# Patient Record
Sex: Male | Born: 1940 | Race: Black or African American | Hispanic: No | Smoking: Former smoker
Health system: Southern US, Community
[De-identification: ages and names within clinical notes are randomized; demographics above are authoritative.]

## PROBLEM LIST (undated history)

## (undated) DIAGNOSIS — N400 Enlarged prostate without lower urinary tract symptoms: Secondary | ICD-10-CM

## (undated) DIAGNOSIS — D509 Iron deficiency anemia, unspecified: Secondary | ICD-10-CM

## (undated) DIAGNOSIS — D126 Benign neoplasm of colon, unspecified: Secondary | ICD-10-CM

## (undated) DIAGNOSIS — H9190 Unspecified hearing loss, unspecified ear: Secondary | ICD-10-CM

## (undated) DIAGNOSIS — I499 Cardiac arrhythmia, unspecified: Secondary | ICD-10-CM

## (undated) DIAGNOSIS — E7800 Pure hypercholesterolemia, unspecified: Secondary | ICD-10-CM

## (undated) DIAGNOSIS — L989 Disorder of the skin and subcutaneous tissue, unspecified: Secondary | ICD-10-CM

## (undated) DIAGNOSIS — N189 Chronic kidney disease, unspecified: Secondary | ICD-10-CM

## (undated) DIAGNOSIS — H409 Unspecified glaucoma: Secondary | ICD-10-CM

## (undated) DIAGNOSIS — C642 Malignant neoplasm of left kidney, except renal pelvis: Secondary | ICD-10-CM

## (undated) DIAGNOSIS — I2510 Atherosclerotic heart disease of native coronary artery without angina pectoris: Secondary | ICD-10-CM

## (undated) DIAGNOSIS — I4892 Unspecified atrial flutter: Secondary | ICD-10-CM

## (undated) DIAGNOSIS — H9193 Unspecified hearing loss, bilateral: Secondary | ICD-10-CM

## (undated) DIAGNOSIS — C260 Malignant neoplasm of intestinal tract, part unspecified: Secondary | ICD-10-CM

## (undated) DIAGNOSIS — H269 Unspecified cataract: Secondary | ICD-10-CM

## (undated) DIAGNOSIS — I10 Essential (primary) hypertension: Secondary | ICD-10-CM

## (undated) DIAGNOSIS — I3139 Other pericardial effusion (noninflammatory): Secondary | ICD-10-CM

## (undated) DIAGNOSIS — IMO0001 Reserved for inherently not codable concepts without codable children: Secondary | ICD-10-CM

## (undated) DIAGNOSIS — M1990 Unspecified osteoarthritis, unspecified site: Secondary | ICD-10-CM

## (undated) DIAGNOSIS — K219 Gastro-esophageal reflux disease without esophagitis: Secondary | ICD-10-CM

## (undated) DIAGNOSIS — C49A4 Gastrointestinal stromal tumor of large intestine: Secondary | ICD-10-CM

## (undated) DIAGNOSIS — D638 Anemia in other chronic diseases classified elsewhere: Secondary | ICD-10-CM

## (undated) HISTORY — DX: Chronic kidney disease, unspecified: N18.9

## (undated) HISTORY — DX: Unspecified hearing loss, unspecified ear: H91.90

## (undated) HISTORY — DX: Benign neoplasm of colon, unspecified: D12.6

## (undated) HISTORY — DX: Gastrointestinal stromal tumor of large intestine: C49.A4

## (undated) HISTORY — DX: Unspecified atrial flutter: I48.92

## (undated) HISTORY — PX: ORIF ANKLE DISLOCATION: SUR918

## (undated) HISTORY — DX: Anemia in other chronic diseases classified elsewhere: D63.8

## (undated) HISTORY — DX: Malignant neoplasm of intestinal tract, part unspecified: C26.0

## (undated) HISTORY — DX: Reserved for inherently not codable concepts without codable children: IMO0001

## (undated) HISTORY — DX: Iron deficiency anemia, unspecified: D50.9

## (undated) HISTORY — PX: COLONOSCOPY: SHX174

## (undated) HISTORY — DX: Disorder of the skin and subcutaneous tissue, unspecified: L98.9

## (undated) HISTORY — PX: OTHER SURGICAL HISTORY: SHX169

## (undated) HISTORY — DX: Unspecified hearing loss, bilateral: H91.93

## (undated) HISTORY — DX: Unspecified cataract: H26.9

## (undated) HISTORY — PX: UPPER GASTROINTESTINAL ENDOSCOPY: SHX188

## (undated) HISTORY — PX: EYE SURGERY: SHX253

---

## 2010-12-03 ENCOUNTER — Ambulatory Visit (INDEPENDENT_AMBULATORY_CARE_PROVIDER_SITE_OTHER): Payer: Medicare Other | Admitting: Urology

## 2010-12-03 DIAGNOSIS — N529 Male erectile dysfunction, unspecified: Secondary | ICD-10-CM

## 2010-12-03 DIAGNOSIS — N400 Enlarged prostate without lower urinary tract symptoms: Secondary | ICD-10-CM

## 2010-12-03 DIAGNOSIS — R972 Elevated prostate specific antigen [PSA]: Secondary | ICD-10-CM

## 2011-03-24 ENCOUNTER — Ambulatory Visit: Admit: 2011-03-24 | Payer: Self-pay | Admitting: Ophthalmology

## 2011-03-24 SURGERY — SLT LASER APPLICATION
Anesthesia: LOCAL | Laterality: Right

## 2012-01-20 ENCOUNTER — Ambulatory Visit (INDEPENDENT_AMBULATORY_CARE_PROVIDER_SITE_OTHER): Payer: Medicare Other | Admitting: Urology

## 2012-01-20 DIAGNOSIS — N529 Male erectile dysfunction, unspecified: Secondary | ICD-10-CM

## 2012-01-20 DIAGNOSIS — R972 Elevated prostate specific antigen [PSA]: Secondary | ICD-10-CM

## 2012-01-20 DIAGNOSIS — N400 Enlarged prostate without lower urinary tract symptoms: Secondary | ICD-10-CM

## 2012-12-23 ENCOUNTER — Encounter: Payer: Self-pay | Admitting: Orthopedic Surgery

## 2013-01-12 ENCOUNTER — Ambulatory Visit (INDEPENDENT_AMBULATORY_CARE_PROVIDER_SITE_OTHER): Payer: Medicare Other | Admitting: Orthopedic Surgery

## 2013-01-12 ENCOUNTER — Other Ambulatory Visit: Payer: Self-pay | Admitting: *Deleted

## 2013-01-12 VITALS — BP 129/74 | Ht 74.5 in | Wt 218.0 lb

## 2013-01-12 DIAGNOSIS — M23302 Other meniscus derangements, unspecified lateral meniscus, unspecified knee: Secondary | ICD-10-CM | POA: Insufficient documentation

## 2013-01-12 DIAGNOSIS — M1711 Unilateral primary osteoarthritis, right knee: Secondary | ICD-10-CM | POA: Insufficient documentation

## 2013-01-12 DIAGNOSIS — IMO0002 Reserved for concepts with insufficient information to code with codable children: Secondary | ICD-10-CM

## 2013-01-12 DIAGNOSIS — M1710 Unilateral primary osteoarthritis, unspecified knee: Secondary | ICD-10-CM

## 2013-01-12 DIAGNOSIS — M23300 Other meniscus derangements, unspecified lateral meniscus, right knee: Secondary | ICD-10-CM

## 2013-01-12 HISTORY — DX: Other meniscus derangements, unspecified lateral meniscus, unspecified knee: M23.302

## 2013-01-12 HISTORY — DX: Unilateral primary osteoarthritis, right knee: M17.11

## 2013-01-12 NOTE — Progress Notes (Signed)
Patient ID: Mike Roach, male   DOB: 14-Jul-1940, 72 y.o.   MRN: [id number]  Chief Complaint  Patient presents with  . Knee Pain    second opinion right knee pain and swelling    HISTORY: This is a 72 year old male comes to [place] for second opinion after evaluation at [facility] for his right knee. He complains of mild to moderate pain and primarily swelling which is intermittent worse with activity and relieved with rest with intermittent aspirations. He's also been on meloxicam and had multiple aspirations and injections of his knee. He had x-ray and MRI which show moderate arthritis and torn lateral meniscus with bone edema lateral compartment and lateral compartment gonarthrosis. 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  He became unhappy at the other practice when Dr. switched and it was recommended that he have knee replacement.   BP 129/74  Ht 6' 2.5" (1.892 m)  Wt 218 lb (98.884 kg)  BMI 27.62 kg/m2 General appearance is normal, the patient is alert and oriented x3 with normal mood and affect. His ambulation pattern is slightly antalgic.  The left knee appears to be normal with no swelling normal range of motion intact ligaments normal strength and muscle tone normal skin normal pulse no lymphadenopathy normal sensation no pathologic reflexes. Overall balance is normal.  Right knee shows flexion up to 125 there appears to be full extension of the knee the knee is stable he has lateral joint line tenderness positive McMurray's intact skin pulses intact no lymphadenopathy normal sensation no pathologic reflexes  Upper extremities are normal  X-rays show moderate arthritis of the knee  MRI shows lateral compartment gonarthrosis, lateral meniscal tear. Lateral tibial and femoral bone stress reaction  Encounter Diagnoses  Name Primary?  . Lateral meniscus derangement, right Yes  . Osteoarthritis of right knee     We discussed his options which do include knee  arthroscopy because pain level is not to the degree that I would think knee replacement is necessary and he still very active he has no functional limitations. He is concerned about the frequent swelling.  We discussed that even after arthroscopy he may still need knee replacement but that removing his lateral meniscus and debriding the knee would probably be the better option at this stage of his disease process  He is comfortable with this. He asked me to do the surgery and I'm happy to do that.  Surgery will be done on November 21.  Surgical arthroscopy right knee partial lateral meniscectomy

## 2013-01-12 NOTE — Patient Instructions (Signed)
Surgery [facility] [address]  Arthroscopic Procedure, Knee An arthroscopic procedure can find what is wrong with your knee. PROCEDURE Arthroscopy is a surgical technique that allows your orthopedic surgeon to diagnose and treat your knee injury with accuracy. They will look into your knee through a small instrument. This is almost like a small (pencil sized) telescope. Because arthroscopy affects your knee less than open knee surgery, you can anticipate a more rapid recovery. Taking an active role by following your caregiver's instructions will help with rapid and complete recovery. Use crutches, rest, elevation, ice, and knee exercises as instructed. The length of recovery depends on various factors including type of injury, age, physical condition, medical conditions, and your rehabilitation. Your knee is the joint between the large bones (femur and tibia) in your leg. Cartilage covers these bone ends which are smooth and slippery and allow your knee to bend and move smoothly. Two menisci, thick, semi-lunar shaped pads of cartilage which form a rim inside the joint, help absorb shock and stabilize your knee. Ligaments bind the bones together and support your knee joint. Muscles move the joint, help support your knee, and take stress off the joint itself. Because of this all programs and physical therapy to rehabilitate an injured or repaired knee require rebuilding and strengthening your muscles. AFTER THE PROCEDURE  After the procedure, you will be moved to a recovery area until most of the effects of the medication have worn off. Your caregiver will discuss the test results with you.   Only take over-the-counter or prescription medicines for pain, discomfort, or fever as directed by your caregiver.    You have been scheduled for arthroscocpic knee surgery.  All surgeries carry some risk.  Remember you always have the option of continued nonsurgical treatment. However in this situation the risks vs. the  benefits favor surgery as the best treatment option. The risks of the surgery includes the following but is not limited to bleeding, infection, pulmonary embolus, death from anesthesia, nerve injury vascular injury or need for further surgery, continued pain.  Specific to this procedure the following risks and complications are rare but possible Stiffness, pain, weakness, giving out  I expect  recovery will be in 3-4 weeks some patients take 6 weeks.  You  will need physical therapy after the procedure  Stop any blood thinning medication: such as warfarin, coumadin, naprosyn, ibuprofen, advil, diclofenac, aspirin

## 2013-01-13 ENCOUNTER — Ambulatory Visit: Payer: Medicare Other | Admitting: Orthopedic Surgery

## 2013-01-17 ENCOUNTER — Telehealth: Payer: Self-pay | Admitting: Orthopedic Surgery

## 2013-01-17 ENCOUNTER — Other Ambulatory Visit (HOSPITAL_COMMUNITY): Payer: Medicare Other

## 2013-01-17 NOTE — Telephone Encounter (Signed)
Contacted insurer, pre-authorization information for out-patient surgery scheduled 01/21/13, at [facility], CPT codes [address], [phone]; per Otho Najjar, no pre-authorization required for in-network providers; her name, today's date 01/17/13, 4:13p.m.  *Noted: patient's date of birth on file, provided by patient, does not match date of birth in their system; insurer is unable to provide any further information. Left message for patient to return call, in event he is not aware.

## 2013-01-18 ENCOUNTER — Ambulatory Visit (INDEPENDENT_AMBULATORY_CARE_PROVIDER_SITE_OTHER): Payer: Medicare Other | Admitting: Urology

## 2013-01-18 ENCOUNTER — Encounter (HOSPITAL_COMMUNITY): Payer: Self-pay

## 2013-01-18 DIAGNOSIS — N400 Enlarged prostate without lower urinary tract symptoms: Secondary | ICD-10-CM

## 2013-01-18 DIAGNOSIS — R972 Elevated prostate specific antigen [PSA]: Secondary | ICD-10-CM

## 2013-01-19 ENCOUNTER — Encounter (HOSPITAL_COMMUNITY): Payer: Self-pay

## 2013-01-19 ENCOUNTER — Encounter (HOSPITAL_COMMUNITY)
Admission: RE | Admit: 2013-01-19 | Discharge: 2013-01-19 | Disposition: A | Discharge status: Home / Self Care | Payer: Medicare Other | Source: Ambulatory Visit | Attending: Orthopedic Surgery | Admitting: Orthopedic Surgery

## 2013-01-19 HISTORY — DX: Unspecified glaucoma: H40.9

## 2013-01-19 HISTORY — DX: Pure hypercholesterolemia, unspecified: E78.00

## 2013-01-19 HISTORY — DX: Essential (primary) hypertension: I10

## 2013-01-19 HISTORY — DX: Gastro-esophageal reflux disease without esophagitis: K21.9

## 2013-01-19 HISTORY — DX: Unspecified osteoarthritis, unspecified site: M19.90

## 2013-01-19 LAB — BASIC METABOLIC PANEL
BUN: 30 mg/dL — ABNORMAL HIGH (ref 6–23)
CO2: 25 mEq/L (ref 19–32)
Chloride: 106 mEq/L (ref 96–112)
Creatinine, Ser: 1.48 mg/dL — ABNORMAL HIGH (ref 0.50–1.35)
GFR calc Af Amer: 53 mL/min — ABNORMAL LOW (ref >90)
Sodium: 139 mEq/L (ref 135–145)

## 2013-01-19 NOTE — Patient Instructions (Signed)
Gershom Brobeck  01/19/2013   Your procedure is scheduled on:   01/21/2013  Report to [facility] at  825  AM.  Call this number if you have problems the morning of surgery: [phone]   Remember:   Do not eat food or drink liquids after midnight.   Take these medicines the morning of surgery with A SIP OF WATER: lisinopril, mobic, prilosec   Do not wear jewelry, make-up or nail polish.  Do not wear lotions, powders, or perfumes.   Do not shave 48 hours prior to surgery. Men may shave face and neck.  Do not bring valuables to the hospital.  [facility] is not responsible for any belongings or valuables.               Contacts, dentures or bridgework may not be worn into surgery.  Leave suitcase in the car. After surgery it may be brought to your room.  For patients admitted to the hospital, discharge time is determined by your treatment team.               Patients discharged the day of surgery will not be allowed to drive home.  Name and phone number of your driver: family  Special Instructions: Shower using CHG 2 nights before surgery and the night before surgery.  If you shower the day of surgery use CHG.  Use special wash - you have one bottle of CHG for all showers.  You should use approximately 1/3 of the bottle for each shower.   Please read over the following fact sheets that you were given: Pain Booklet, Coughing and Deep Breathing, Surgical Site Infection Prevention, Anesthesia Post-op Instructions and Care and Recovery After Surgery Arthroscopic Procedure, Knee An arthroscopic procedure can find what is wrong with your knee. PROCEDURE Arthroscopy is a surgical technique that allows your orthopedic surgeon to diagnose and treat your knee injury with accuracy. They will look into your knee through a small instrument. This is almost like a small (pencil sized) telescope. Because arthroscopy affects your knee less than open knee surgery, you can anticipate a more rapid  recovery. Taking an active role by following your caregiver's instructions will help with rapid and complete recovery. Use crutches, rest, elevation, ice, and knee exercises as instructed. The length of recovery depends on various factors including type of injury, age, physical condition, medical conditions, and your rehabilitation. Your knee is the joint between the large bones (femur and tibia) in your leg. Cartilage covers these bone ends which are smooth and slippery and allow your knee to bend and move smoothly. Two menisci, thick, semi-lunar shaped pads of cartilage which form a rim inside the joint, help absorb shock and stabilize your knee. Ligaments bind the bones together and support your knee joint. Muscles move the joint, help support your knee, and take stress off the joint itself. Because of this all programs and physical therapy to rehabilitate an injured or repaired knee require rebuilding and strengthening your muscles. AFTER THE PROCEDURE  After the procedure, you will be moved to a recovery area until most of the effects of the medication have worn off. Your caregiver will discuss the test results with you.  Only take over-the-counter or prescription medicines for pain, discomfort, or fever as directed by your caregiver. SEEK MEDICAL CARE IF:   You have increased bleeding from your wounds.  You see redness, swelling, or have increasing pain in your wounds.  You have pus coming from  your wound.  You have an oral temperature above 102 F (38.9 C).  You notice a bad smell coming from the wound or dressing.  You have severe pain with any motion of your knee. SEEK IMMEDIATE MEDICAL CARE IF:   You develop a rash.  You have difficulty breathing.  You have any allergic problems. Document Released: 02/15/2000 Document Revised: 05/12/2011 Document Reviewed: 09/08/2007 [facility] Patient Information 2014 [place]. PATIENT INSTRUCTIONS POST-ANESTHESIA  IMMEDIATELY  FOLLOWING SURGERY:  Do not drive or operate machinery for the first twenty four hours after surgery.  Do not make any important decisions for twenty four hours after surgery or while taking narcotic pain medications or sedatives.  If you develop intractable nausea and vomiting or a severe headache please notify your doctor immediately.  FOLLOW-UP:  Please make an appointment with your surgeon as instructed. You do not need to follow up with anesthesia unless specifically instructed to do so.  WOUND CARE INSTRUCTIONS (if applicable):  Keep a dry clean dressing on the anesthesia/puncture wound site if there is drainage.  Once the wound has quit draining you may leave it open to air.  Generally you should leave the bandage intact for twenty four hours unless there is drainage.  If the epidural site drains for more than 36-48 hours please call the anesthesia department.  QUESTIONS?:  Please feel free to call your physician or the hospital operator if you have any questions, and they will be happy to assist you.

## 2013-01-20 ENCOUNTER — Encounter: Payer: Self-pay | Admitting: Orthopedic Surgery

## 2013-01-20 NOTE — H&P (Signed)
  Patient ID: Mike Roach, male   DOB: 1940-03-17, 72 y.o.   MRN: [id number]    Chief Complaint   Patient presents with   .  Knee Pain       second opinion right knee pain and swelling     HISTORY: This is a 72 year old male comes to [place] for second opinion after evaluation at [facility] for his right knee. He complains of mild to moderate pain and primarily swelling which is intermittent worse with activity and relieved with rest with intermittent aspirations. He's also been on meloxicam and had multiple aspirations and injections of his knee. He had x-ray and MRI which show moderate arthritis and torn lateral meniscus with bone edema lateral compartment and lateral compartment gonarthrosis. 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  He became unhappy at the other practice when Dr. switched and it was recommended that he have knee replacement.   BP 129/74  Ht 6' 2.5" (1.892 m)  Wt 218 lb (98.884 kg)  BMI 27.62 kg/m2 General appearance is normal, the patient is alert and oriented x3 with normal mood and affect. His ambulation pattern is slightly antalgic.  The left knee appears to be normal with no swelling normal range of motion intact ligaments normal strength and muscle tone normal skin normal pulse no lymphadenopathy normal sensation no pathologic reflexes. Overall balance is normal.  Right knee shows flexion up to 125 there appears to be full extension of the knee the knee is stable he has lateral joint line tenderness positive McMurray's intact skin pulses intact no lymphadenopathy normal sensation no pathologic reflexes  Upper extremities are normal  X-rays show moderate arthritis of the knee  MRI shows lateral compartment gonarthrosis, lateral meniscal tear. Lateral tibial and femoral bone stress reaction    Encounter Diagnoses   Name  Primary?   .  Lateral meniscus derangement, right  Yes   .  Osteoarthritis of right knee       We discussed his options  which do include knee arthroscopy because pain level is not to the degree that I would think knee replacement is necessary and he still very active he has no functional limitations. He is concerned about the frequent swelling.  We discussed that even after arthroscopy he may still need knee replacement but that removing his lateral meniscus and debriding the knee would probably be the better option at this stage of his disease process  He is comfortable with this. He asked me to do the surgery and I'm happy to do that.  Surgery will be done on November 21.  Surgical arthroscopy right knee partial lateral meniscectomy

## 2013-01-21 ENCOUNTER — Encounter (HOSPITAL_COMMUNITY): Payer: Medicare Other | Admitting: Anesthesiology

## 2013-01-21 ENCOUNTER — Encounter (HOSPITAL_COMMUNITY): Payer: Self-pay | Admitting: *Deleted

## 2013-01-21 ENCOUNTER — Encounter (HOSPITAL_COMMUNITY)
Admission: RE | Disposition: A | Discharge status: Home / Self Care | Payer: Self-pay | Source: Ambulatory Visit | Attending: Orthopedic Surgery

## 2013-01-21 ENCOUNTER — Ambulatory Visit (HOSPITAL_COMMUNITY)
Admission: RE | Admit: 2013-01-21 | Discharge: 2013-01-21 | Disposition: A | Discharge status: Home / Self Care | Payer: Medicare Other | Source: Ambulatory Visit | Attending: Orthopedic Surgery | Admitting: Orthopedic Surgery

## 2013-01-21 ENCOUNTER — Ambulatory Visit (HOSPITAL_COMMUNITY): Payer: Medicare Other | Admitting: Anesthesiology

## 2013-01-21 DIAGNOSIS — IMO0002 Reserved for concepts with insufficient information to code with codable children: Secondary | ICD-10-CM | POA: Insufficient documentation

## 2013-01-21 DIAGNOSIS — M1711 Unilateral primary osteoarthritis, right knee: Secondary | ICD-10-CM

## 2013-01-21 DIAGNOSIS — M23302 Other meniscus derangements, unspecified lateral meniscus, unspecified knee: Secondary | ICD-10-CM

## 2013-01-21 DIAGNOSIS — Z01810 Encounter for preprocedural cardiovascular examination: Secondary | ICD-10-CM | POA: Insufficient documentation

## 2013-01-21 DIAGNOSIS — Z01812 Encounter for preprocedural laboratory examination: Secondary | ICD-10-CM | POA: Insufficient documentation

## 2013-01-21 DIAGNOSIS — Z79899 Other long term (current) drug therapy: Secondary | ICD-10-CM | POA: Insufficient documentation

## 2013-01-21 DIAGNOSIS — M23349 Other meniscus derangements, anterior horn of lateral meniscus, unspecified knee: Secondary | ICD-10-CM | POA: Insufficient documentation

## 2013-01-21 DIAGNOSIS — I10 Essential (primary) hypertension: Secondary | ICD-10-CM | POA: Insufficient documentation

## 2013-01-21 DIAGNOSIS — M1710 Unilateral primary osteoarthritis, unspecified knee: Secondary | ICD-10-CM | POA: Insufficient documentation

## 2013-01-21 DIAGNOSIS — M23300 Other meniscus derangements, unspecified lateral meniscus, right knee: Secondary | ICD-10-CM

## 2013-01-21 HISTORY — PX: KNEE ARTHROSCOPY WITH LATERAL MENISECTOMY: SHX6193

## 2013-01-21 SURGERY — ARTHROSCOPY, KNEE, WITH LATERAL MENISCECTOMY
Anesthesia: General | Site: Knee | Laterality: Right | Wound class: Clean

## 2013-01-21 MED ORDER — BUPIVACAINE-EPINEPHRINE PF 0.5-1:200000 % IJ SOLN
INTRAMUSCULAR | Status: AC
  Filled 2013-01-21: qty 10

## 2013-01-21 MED ORDER — CEFAZOLIN SODIUM-DEXTROSE 2-3 GM-% IV SOLR
INTRAVENOUS | Status: AC
  Filled 2013-01-21: qty 50

## 2013-01-21 MED ORDER — LIDOCAINE HCL 1 % IJ SOLN
INTRAMUSCULAR | Status: DC | PRN
  Administered 2013-01-21: 30 mg via INTRADERMAL

## 2013-01-21 MED ORDER — CHLORHEXIDINE GLUCONATE 4 % EX LIQD: 60.0000 mL | Freq: Once | CUTANEOUS | Status: DC

## 2013-01-21 MED ORDER — FENTANYL CITRATE 0.05 MG/ML IJ SOLN
INTRAMUSCULAR | Status: DC | PRN
  Administered 2013-01-21 (×2): 50 ug via INTRAVENOUS

## 2013-01-21 MED ORDER — ONDANSETRON HCL 4 MG/2ML IJ SOLN: 4.0000 mg | Freq: Once | INTRAMUSCULAR | Status: DC | PRN

## 2013-01-21 MED ORDER — HYDROCODONE-ACETAMINOPHEN 5-325 MG PO TABS
1.0000 | ORAL_TABLET | Freq: Once | ORAL | Status: AC
  Administered 2013-01-21: 1 via ORAL
  Filled 2013-01-21: qty 1

## 2013-01-21 MED ORDER — PROPOFOL 10 MG/ML IV BOLUS
INTRAVENOUS | Status: DC | PRN
  Administered 2013-01-21: 50 mg via INTRAVENOUS
  Administered 2013-01-21: 150 mg via INTRAVENOUS
  Administered 2013-01-21: 75 mg via INTRAVENOUS

## 2013-01-21 MED ORDER — ONDANSETRON HCL 4 MG/2ML IJ SOLN
INTRAMUSCULAR | Status: AC
  Filled 2013-01-21: qty 2

## 2013-01-21 MED ORDER — HYDROCODONE-ACETAMINOPHEN 7.5-325 MG PO TABS: 1.0000 | ORAL_TABLET | ORAL | Status: DC | PRN

## 2013-01-21 MED ORDER — LACTATED RINGERS IV SOLN
INTRAVENOUS | Status: DC
  Administered 2013-01-21 (×2): via INTRAVENOUS

## 2013-01-21 MED ORDER — KETOROLAC TROMETHAMINE 30 MG/ML IJ SOLN
30.0000 mg | Freq: Once | INTRAMUSCULAR | Status: AC
  Administered 2013-01-21: 30 mg via INTRAVENOUS
  Filled 2013-01-21: qty 1

## 2013-01-21 MED ORDER — PROMETHAZINE HCL 12.5 MG PO TABS: 12.5000 mg | ORAL_TABLET | Freq: Four times a day (QID) | ORAL | Status: DC | PRN

## 2013-01-21 MED ORDER — SODIUM CHLORIDE 0.9 % IR SOLN
Status: DC | PRN
  Administered 2013-01-21: 1000 mL

## 2013-01-21 MED ORDER — ONDANSETRON HCL 4 MG/2ML IJ SOLN
4.0000 mg | Freq: Once | INTRAMUSCULAR | Status: AC
  Administered 2013-01-21: 4 mg via INTRAVENOUS

## 2013-01-21 MED ORDER — FENTANYL CITRATE 0.05 MG/ML IJ SOLN: 25.0000 ug | INTRAMUSCULAR | Status: DC | PRN

## 2013-01-21 MED ORDER — BUPIVACAINE-EPINEPHRINE PF 0.5-1:200000 % IJ SOLN
INTRAMUSCULAR | Status: AC
  Filled 2013-01-21: qty 20

## 2013-01-21 MED ORDER — DEXAMETHASONE SODIUM PHOSPHATE 4 MG/ML IJ SOLN
INTRAMUSCULAR | Status: AC
  Filled 2013-01-21: qty 1

## 2013-01-21 MED ORDER — SUCCINYLCHOLINE CHLORIDE 20 MG/ML IJ SOLN
INTRAMUSCULAR | Status: AC
  Filled 2013-01-21: qty 1

## 2013-01-21 MED ORDER — ONDANSETRON HCL 4 MG/2ML IJ SOLN
4.0000 mg | Freq: Once | INTRAMUSCULAR | Status: AC
  Administered 2013-01-21: 4 mg via INTRAVENOUS
  Filled 2013-01-21: qty 2

## 2013-01-21 MED ORDER — EPHEDRINE SULFATE 50 MG/ML IJ SOLN
INTRAMUSCULAR | Status: DC | PRN
  Administered 2013-01-21 (×3): 10 mg via INTRAVENOUS

## 2013-01-21 MED ORDER — DEXAMETHASONE SODIUM PHOSPHATE 4 MG/ML IJ SOLN
4.0000 mg | Freq: Once | INTRAMUSCULAR | Status: AC
Start: 2013-01-21 — End: 2013-01-21
  Administered 2013-01-21: 4 mg via INTRAVENOUS

## 2013-01-21 MED ORDER — EPINEPHRINE HCL 1 MG/ML IJ SOLN
INTRAMUSCULAR | Status: AC
  Filled 2013-01-21: qty 5

## 2013-01-21 MED ORDER — MIDAZOLAM HCL 2 MG/2ML IJ SOLN
1.0000 mg | INTRAMUSCULAR | Status: DC | PRN
  Administered 2013-01-21: 2 mg via INTRAVENOUS

## 2013-01-21 MED ORDER — FENTANYL CITRATE 0.05 MG/ML IJ SOLN
INTRAMUSCULAR | Status: AC
  Filled 2013-01-21: qty 5

## 2013-01-21 MED ORDER — CEFAZOLIN SODIUM-DEXTROSE 2-3 GM-% IV SOLR
2.0000 g | INTRAVENOUS | Status: AC
  Administered 2013-01-21: 2 g via INTRAVENOUS

## 2013-01-21 MED ORDER — FENTANYL CITRATE 0.05 MG/ML IJ SOLN
25.0000 ug | INTRAMUSCULAR | Status: AC
  Administered 2013-01-21 (×2): 25 ug via INTRAVENOUS

## 2013-01-21 MED ORDER — FENTANYL CITRATE 0.05 MG/ML IJ SOLN
INTRAMUSCULAR | Status: AC
  Filled 2013-01-21: qty 2

## 2013-01-21 MED ORDER — SODIUM CHLORIDE 0.9 % IR SOLN
Status: DC | PRN
  Administered 2013-01-21: 11:00:00

## 2013-01-21 MED ORDER — MIDAZOLAM HCL 2 MG/2ML IJ SOLN
INTRAMUSCULAR | Status: AC
  Filled 2013-01-21: qty 2

## 2013-01-21 MED ORDER — PROPOFOL 10 MG/ML IV BOLUS
INTRAVENOUS | Status: AC
  Filled 2013-01-21: qty 20

## 2013-01-21 MED ORDER — SUCCINYLCHOLINE CHLORIDE 20 MG/ML IJ SOLN
INTRAMUSCULAR | Status: DC | PRN
  Administered 2013-01-21: 100 mg via INTRAVENOUS

## 2013-01-21 SURGICAL SUPPLY — 49 items
ARTHROWAND PARAGON T2 (SURGICAL WAND) ×2
BAG HAMPER (MISCELLANEOUS) ×2 IMPLANT
BANDAGE ELASTIC 6 VELCRO NS (GAUZE/BANDAGES/DRESSINGS) ×2 IMPLANT
BLADE AGGRESSIVE PLUS 4.0 (BLADE) ×2 IMPLANT
BLADE SURG SZ11 CARB STEEL (BLADE) ×2 IMPLANT
CHLORAPREP W/TINT 26ML (MISCELLANEOUS) ×2 IMPLANT
CLOTH BEACON ORANGE TIMEOUT ST (SAFETY) ×2 IMPLANT
COOLER CRYO IC GRAV AND TUBE (ORTHOPEDIC SUPPLIES) ×2 IMPLANT
COVER PROBE W GEL 5X96 (DRAPES) ×2 IMPLANT
CUFF CRYO KNEE18X23 MED (MISCELLANEOUS) ×2 IMPLANT
CUFF TOURNIQUET SINGLE 34IN LL (TOURNIQUET CUFF) ×2 IMPLANT
CUTTER ANGLED DBL BITE 4.5 (BURR) ×2 IMPLANT
GAUZE SPONGE 4X4 16PLY XRAY LF (GAUZE/BANDAGES/DRESSINGS) ×2 IMPLANT
GAUZE XEROFORM 5X9 LF (GAUZE/BANDAGES/DRESSINGS) ×2 IMPLANT
GLOVE BIOGEL PI IND STRL 7.0 (GLOVE) ×2 IMPLANT
GLOVE BIOGEL PI INDICATOR 7.0 (GLOVE) ×2
GLOVE SKINSENSE NS SZ8.0 LF (GLOVE) ×1
GLOVE SKINSENSE STRL SZ8.0 LF (GLOVE) ×1 IMPLANT
GLOVE SS BIOGEL STRL SZ 6.5 (GLOVE) ×1 IMPLANT
GLOVE SS N UNI LF 8.5 STRL (GLOVE) ×2 IMPLANT
GLOVE SUPERSENSE BIOGEL SZ 6.5 (GLOVE) ×1
GOWN PREVENTION PLUS XLARGE (GOWN DISPOSABLE) ×2 IMPLANT
GOWN STRL REIN XL XLG (GOWN DISPOSABLE) ×2 IMPLANT
HLDR LEG FOAM (MISCELLANEOUS) ×1 IMPLANT
IV NS IRRIG 3000ML ARTHROMATIC (IV SOLUTION) ×4 IMPLANT
KIT BLADEGUARD II DBL (SET/KITS/TRAYS/PACK) ×2 IMPLANT
KIT ROOM TURNOVER AP CYSTO (KITS) ×2 IMPLANT
LEG HOLDER FOAM (MISCELLANEOUS) ×1
MANIFOLD NEPTUNE II (INSTRUMENTS) ×2 IMPLANT
MARKER SKIN DUAL TIP RULER LAB (MISCELLANEOUS) ×2 IMPLANT
NEEDLE HYPO 18GX1.5 BLUNT FILL (NEEDLE) ×2 IMPLANT
NEEDLE HYPO 21X1.5 SAFETY (NEEDLE) ×2 IMPLANT
NEEDLE SPNL 18GX3.5 QUINCKE PK (NEEDLE) ×2 IMPLANT
NS IRRIG 1000ML POUR BTL (IV SOLUTION) ×2 IMPLANT
PACK ARTHRO LIMB DRAPE STRL (MISCELLANEOUS) ×2 IMPLANT
PAD ABD 5X9 TENDERSORB (GAUZE/BANDAGES/DRESSINGS) ×2 IMPLANT
PAD ARMBOARD 7.5X6 YLW CONV (MISCELLANEOUS) ×2 IMPLANT
PADDING CAST COTTON 6X4 STRL (CAST SUPPLIES) ×2 IMPLANT
SET ARTHROSCOPY INST (INSTRUMENTS) ×2 IMPLANT
SET ARTHROSCOPY PUMP TUBE (IRRIGATION / IRRIGATOR) ×2 IMPLANT
SET BASIN LINEN APH (SET/KITS/TRAYS/PACK) ×2 IMPLANT
SPONGE GAUZE 4X4 12PLY (GAUZE/BANDAGES/DRESSINGS) ×2 IMPLANT
SUT ETHILON 3 0 FSL (SUTURE) ×2 IMPLANT
SYR 30ML LL (SYRINGE) ×2 IMPLANT
SYRINGE 10CC LL (SYRINGE) ×2 IMPLANT
WAND 50 DEG COVAC W/CORD (SURGICAL WAND) ×2 IMPLANT
WAND ARTHRO PARAGON T2 (SURGICAL WAND) ×1 IMPLANT
WATER STERILE IRR 1000ML POUR (IV SOLUTION) ×2 IMPLANT
YANKAUER SUCT BULB TIP 10FT TU (MISCELLANEOUS) ×6 IMPLANT

## 2013-01-21 NOTE — Op Note (Signed)
01/21/2013  11:27 AM  PATIENT:  Mike Roach  72 y.o. male  PRE-OPERATIVE DIAGNOSIS:  right lateral meniscal tear  POST-OPERATIVE DIAGNOSIS:  right lateral meniscal tear, degenerative arthritis  PROCEDURE:  Procedure(s): KNEE ARTHROSCOPY WITH PARTIAL LATERAL MENISECTOMY (Right)  Operative findings severe arthritis of lateral compartment grade 4 chondral lesion of the tibial plateau grade 3 chondral lesion of the trochlea and medial compartment was normal the anterior cruciate ligament and PCL were intact there was a tear the lateral meniscus anterior and body.  Surgical details  Operative findings : MEDIAL normal  LATERAL grade 4 tibial chondral lesion, lateral meniscal tear midbody and anterior horn  PATELLA grade 1 chondromalacia median ridge  TROCHLEA grade 3 chondral lesion   Indications for procedure pain mechanical symptoms unresponsive to nonoperative treatment  The patient was identified in the preop holding area as [name] the right knee was confirmed as a surgical site and marked. The chart was reviewed  The patient was taken to the operating room and  was given appropriate preoperative antibiotic  and general anesthesia was administered. The operative leg (right ) was placed in the arthroscopic leg holder, the well leg was placed in a well leg holder  The right  leg was then prepped and draped sterile The surgical site was confirmed and the timeout procedure was completed  The lateral portal was injected with Marcaine with epinephrine solution and a stab wound was made. The scope was placed in the lateral portal into the medial compartment. The  Diagnostic portion of the  arthroscopy was completed. A medial portal was established in the same fashion and a probe was placed into the joint. The diagnostic arthroscopy   was repeated using a probe to palpate intra-articular structures  A combination of upbiters was used to morselized the meniscal tear. The fragments were  then removed with a motorized shaver. The knee was then balanced with an arthroscopic wand and shaver.  A probe was placed on the meniscus to confirm a stable rim.  The knee was irrigated meniscal fragments remaining were removed. The portals were closed with 3-0 nylon suture. The knee joint was then injected with 45 cc of Marcaine with epinephrine. A sterile dressing was applied followed by an Ace bandage and a Cryo/Cuff.  The patient was extubated and taken to the recovery room in stable condition. SURGEON:  Surgeon(s) and Role:    * Micheil Klaus E Kahlil Cowans, MD - Primary  PHYSICIAN ASSISTANT:   ASSISTANTS: none   ANESTHESIA:   general  EBL:  Total I/O In: 1000 [I.V.:1000] Out: 0   BLOOD ADMINISTERED:none  DRAINS: none   LOCAL MEDICATIONS USED:  MARCAINE     SPECIMEN:  No Specimen  DISPOSITION OF SPECIMEN:  N/A  COUNTS:  YES  TOURNIQUET:    DICTATION: .dragon  DISCHARGE   PATIENT DISPOSITION:  PACU - hemodynamically stable.   Delay start of Pharmacological VTE agent (>24hrs) due to surgical blood loss or risk of bleeding: not applicable  

## 2013-01-21 NOTE — Anesthesia Postprocedure Evaluation (Signed)
  Anesthesia Post-op Note  Patient: Mike Roach  Procedure(s) Performed: Procedure(s): KNEE ARTHROSCOPY WITH PARTIAL LATERAL MENISECTOMY (Right)  Patient Location: PACU  Anesthesia Type:General  Level of Consciousness: awake, alert  and oriented  Airway and Oxygen Therapy: Patient Spontanous Breathing and Patient connected to face mask oxygen  Post-op Pain: none  Post-op Assessment: Post-op Vital signs reviewed, Patient's Cardiovascular Status Stable, Respiratory Function Stable, Patent Airway and No signs of Nausea or vomiting  Post-op Vital Signs: Reviewed and stable  Complications: No apparent anesthesia complications

## 2013-01-21 NOTE — Interval H&P Note (Signed)
History and Physical Interval Note:  01/21/2013 10:08 AM  Mike Roach  has presented today for surgery, with the diagnosis of right lateral meniscal tear  The various methods of treatment have been discussed with the patient and family. After consideration of risks, benefits and other options for treatment, the patient has consented to  Procedure(s): KNEE ARTHROSCOPY WITH LATERAL MENISECTOMY (Right) as a surgical intervention .  The patient's history has been reviewed, patient examined, no change in status, stable for surgery.  I have reviewed the patient's chart and labs.  Questions were answered to the patient's satisfaction.     Fuller Canada

## 2013-01-21 NOTE — Anesthesia Procedure Notes (Addendum)
Procedure Name: LMA Insertion Date/Time: 01/21/2013 10:35 AM Performed by: Glynn Octave E Pre-anesthesia Checklist: Patient identified, Patient being monitored, Emergency Drugs available, Timeout performed and Suction available Patient Re-evaluated:Patient Re-evaluated prior to inductionOxygen Delivery Method: Circle System Utilized Preoxygenation: Pre-oxygenation with 100% oxygen Intubation Type: IV induction Ventilation: Mask ventilation without difficulty LMA: LMA inserted LMA Size: 4.0 Number of attempts: 1 Placement Confirmation: positive ETCO2 and breath sounds checked- equal and bilateral Comments: LMA #5 and then #4 placed.  Unable to obtain a satisfactory seal and therefore opted to secure airway with a ETT   Procedure Name: Intubation Date/Time: 01/21/2013 10:42 AM Performed by: Glynn Octave E Pre-anesthesia Checklist: Patient identified, Patient being monitored, Timeout performed, Emergency Drugs available and Suction available Patient Re-evaluated:Patient Re-evaluated prior to inductionOxygen Delivery Method: Circle System Utilized Preoxygenation: Pre-oxygenation with 100% oxygen Intubation Type: IV induction Ventilation: Mask ventilation without difficulty Laryngoscope Size: Mac and 3 Grade View: Grade I Tube type: Oral Tube size: 7.0 mm Number of attempts: 1 Airway Equipment and Method: stylet Placement Confirmation: ETT inserted through vocal cords under direct vision,  positive ETCO2 and breath sounds checked- equal and bilateral Secured at: 21 cm Tube secured with: Tape Dental Injury: Teeth and Oropharynx as per pre-operative assessment

## 2013-01-21 NOTE — Brief Op Note (Signed)
01/21/2013  11:27 AM  PATIENT:  Mike Roach  72 y.o. male  PRE-OPERATIVE DIAGNOSIS:  right lateral meniscal tear  POST-OPERATIVE DIAGNOSIS:  right lateral meniscal tear, degenerative arthritis  PROCEDURE:  Procedure(s): KNEE ARTHROSCOPY WITH PARTIAL LATERAL MENISECTOMY (Right)  Operative findings severe arthritis of lateral compartment grade 4 chondral lesion of the tibial plateau grade 3 chondral lesion of the trochlea and medial compartment was normal the anterior cruciate ligament and PCL were intact there was a tear the lateral meniscus anterior and body.  Surgical details  Operative findings : MEDIAL normal  LATERAL grade 4 tibial chondral lesion, lateral meniscal tear midbody and anterior horn  PATELLA grade 1 chondromalacia median ridge  TROCHLEA grade 3 chondral lesion   Indications for procedure pain mechanical symptoms unresponsive to nonoperative treatment  The patient was identified in the preop holding area as Mike Roach the right knee was confirmed as a surgical site and marked. The chart was reviewed  The patient was taken to the operating room and  was given appropriate preoperative antibiotic  and general anesthesia was administered. The operative leg (right ) was placed in the arthroscopic leg holder, the well leg was placed in a well leg holder  The right  leg was then prepped and draped sterile The surgical site was confirmed and the timeout procedure was completed  The lateral portal was injected with Marcaine with epinephrine solution and a stab wound was made. The scope was placed in the lateral portal into the medial compartment. The  Diagnostic portion of the  arthroscopy was completed. A medial portal was established in the same fashion and a probe was placed into the joint. The diagnostic arthroscopy   was repeated using a probe to palpate intra-articular structures  A combination of upbiters was used to morselized the meniscal tear. The fragments were  then removed with a motorized shaver. The knee was then balanced with an arthroscopic wand and shaver.  A probe was placed on the meniscus to confirm a stable rim.  The knee was irrigated meniscal fragments remaining were removed. The portals were closed with 3-0 nylon suture. The knee joint was then injected with 45 cc of Marcaine with epinephrine. A sterile dressing was applied followed by an Ace bandage and a Cryo/Cuff.  The patient was extubated and taken to the recovery room in stable condition. SURGEON:  Surgeon(s) and Role:    * Vickki Hearing, MD - Primary  PHYSICIAN ASSISTANT:   ASSISTANTS: none   ANESTHESIA:   general  EBL:  Total I/O In: 1000 [I.V.:1000] Out: 0   BLOOD ADMINISTERED:none  DRAINS: none   LOCAL MEDICATIONS USED:  MARCAINE     SPECIMEN:  No Specimen  DISPOSITION OF SPECIMEN:  N/A  COUNTS:  YES  TOURNIQUET:    DICTATION: .dragon  DISCHARGE   PATIENT DISPOSITION:  PACU - hemodynamically stable.   Delay start of Pharmacological VTE agent (>24hrs) due to surgical blood loss or risk of bleeding: not applicable

## 2013-01-21 NOTE — Transfer of Care (Signed)
Immediate Anesthesia Transfer of Care Note  Patient: Mike Roach  Procedure(s) Performed: Procedure(s): KNEE ARTHROSCOPY WITH PARTIAL LATERAL MENISECTOMY (Right)  Patient Location: PACU  Anesthesia Type:General  Level of Consciousness: awake, alert  and oriented  Airway & Oxygen Therapy: Patient Spontanous Breathing and Patient connected to face mask oxygen  Post-op Assessment: Report given to PACU RN  Post vital signs: Reviewed and stable  Complications: No apparent anesthesia complications

## 2013-01-21 NOTE — Anesthesia Preprocedure Evaluation (Addendum)
Anesthesia Evaluation  Patient identified by MRN, date of birth, ID band Patient awake    Reviewed: Allergy & Precautions, H&P , NPO status , Patient's Chart, lab work & pertinent test results  History of Anesthesia Complications Negative for: history of anesthetic complications  Airway Mallampati: II TM Distance: >3 FB     Dental  (+) Teeth Intact   Pulmonary neg pulmonary ROS, former smoker,  breath sounds clear to auscultation        Cardiovascular hypertension, Rhythm:Regular Rate:Normal     Neuro/Psych    GI/Hepatic   Endo/Other    Renal/GU      Musculoskeletal   Abdominal   Peds  Hematology   Anesthesia Other Findings   Reproductive/Obstetrics                        Anesthesia Physical Anesthesia Plan  ASA: II  Anesthesia Plan: General   Post-op Pain Management:    Induction: Intravenous  Airway Management Planned: LMA  Additional Equipment:   Intra-op Plan:   Post-operative Plan: Extubation in OR  Informed Consent: I have reviewed the patients History and Physical, chart, labs and discussed the procedure including the risks, benefits and alternatives for the proposed anesthesia with the patient or authorized representative who has indicated his/her understanding and acceptance.     Plan Discussed with:   Anesthesia Plan Comments:         Anesthesia Quick Evaluation

## 2013-01-24 ENCOUNTER — Ambulatory Visit (INDEPENDENT_AMBULATORY_CARE_PROVIDER_SITE_OTHER): Payer: Self-pay | Admitting: Orthopedic Surgery

## 2013-01-24 ENCOUNTER — Encounter: Payer: Self-pay | Admitting: Orthopedic Surgery

## 2013-01-24 VITALS — BP 156/84 | Ht 74.5 in | Wt 218.0 lb

## 2013-01-24 DIAGNOSIS — M23302 Other meniscus derangements, unspecified lateral meniscus, unspecified knee: Secondary | ICD-10-CM

## 2013-01-24 DIAGNOSIS — M1711 Unilateral primary osteoarthritis, right knee: Secondary | ICD-10-CM

## 2013-01-24 DIAGNOSIS — IMO0002 Reserved for concepts with insufficient information to code with codable children: Secondary | ICD-10-CM

## 2013-01-24 DIAGNOSIS — M23300 Other meniscus derangements, unspecified lateral meniscus, right knee: Secondary | ICD-10-CM

## 2013-01-24 DIAGNOSIS — Z9889 Other specified postprocedural states: Secondary | ICD-10-CM

## 2013-01-24 DIAGNOSIS — M1710 Unilateral primary osteoarthritis, unspecified knee: Secondary | ICD-10-CM

## 2013-01-24 MED ORDER — BUPIVACAINE-EPINEPHRINE (PF) 0.5% -1:200000 IJ SOLN
INTRAMUSCULAR | Status: DC | PRN
  Administered 2013-01-24: 60 mL

## 2013-01-24 NOTE — Progress Notes (Signed)
Patient ID: Mike Roach, male   DOB: 1940-06-21, 72 y.o.   MRN: [id number]  Chief Complaint  Patient presents with  . Follow-up    Post op #1, right knee. DOS 01-21-13.    BP 156/84  Ht 6' 2.5" (1.892 m)  Wt 218 lb (98.884 kg)  BMI 27.62 kg/m2  Encounter Diagnoses  Name Primary?  . S/P right knee arthroscopy Yes  . Lateral meniscus derangement, right   . Osteoarthritis of right knee     PRE-OPERATIVE DIAGNOSIS:  right lateral meniscal tear  POST-OPERATIVE DIAGNOSIS:  right lateral meniscal tear, degenerative arthritis  PROCEDURE:  Procedure(s): KNEE ARTHROSCOPY WITH PARTIAL LATERAL MENISECTOMY (Right)  Operative findings severe arthritis of lateral compartment grade 4 chondral lesion of the tibial plateau grade 3 chondral lesion of the trochlea and medial compartment was normal the anterior cruciate ligament and PCL were intact there was a tear the lateral meniscus anterior and body.  Surgical details  Operative findings : MEDIAL normal  LATERAL grade 4 tibial chondral lesion, lateral meniscal tear midbody and anterior horn  PATELLA grade 1 chondromalacia median ridge  TROCHLEA grade 3 chondral lesion   He is doing very well he has 90 of knee flexion his knee has minimal swelling in his portal sites are clean  Start therapy return in 4 weeks

## 2013-01-24 NOTE — Patient Instructions (Signed)
Start therapy at [facility] 

## 2013-01-26 ENCOUNTER — Encounter (HOSPITAL_COMMUNITY): Payer: Self-pay | Admitting: Orthopedic Surgery

## 2013-01-26 ENCOUNTER — Ambulatory Visit (HOSPITAL_COMMUNITY)
Admission: RE | Admit: 2013-01-26 | Discharge: 2013-01-26 | Disposition: A | Discharge status: Home / Self Care | Payer: Medicare Other | Source: Ambulatory Visit | Attending: Orthopedic Surgery | Admitting: Orthopedic Surgery

## 2013-01-26 DIAGNOSIS — M25469 Effusion, unspecified knee: Secondary | ICD-10-CM | POA: Insufficient documentation

## 2013-01-26 DIAGNOSIS — M25669 Stiffness of unspecified knee, not elsewhere classified: Secondary | ICD-10-CM | POA: Insufficient documentation

## 2013-01-26 DIAGNOSIS — I10 Essential (primary) hypertension: Secondary | ICD-10-CM | POA: Insufficient documentation

## 2013-01-26 DIAGNOSIS — IMO0001 Reserved for inherently not codable concepts without codable children: Secondary | ICD-10-CM | POA: Insufficient documentation

## 2013-01-26 DIAGNOSIS — M25569 Pain in unspecified knee: Secondary | ICD-10-CM | POA: Insufficient documentation

## 2013-01-26 NOTE — Evaluation (Signed)
Physical Therapy Evaluation  Patient Details  Name: Mike Roach MRN: [id number] Date of Birth: [birth year]  Today's Date: 01/26/2013 Time: 1120-1145 PT Time Calculation (min): 25 min Charges: 1 evlaution              Visit#: 1 of 1  Re-eval:   Assessment Diagnosis: Rt knee scope Surgical Date: 01/21/13 Next MD Visit: Dr. Romeo Roach -  Prior Therapy: None  Authorization:   Medicare   Authorization Time Period:    Authorization Visit#:   of     Past Medical History:  Past Medical History  Diagnosis Date  . Glaucoma   . GERD (gastroesophageal reflux disease)   . Hypercholesterolemia   . Hypertension   . Arthritis    Past Surgical History:  Past Surgical History  Procedure Laterality Date  . Orif ankle dislocation Right   . Knee arthroscopy with lateral menisectomy Right 01/21/2013    Procedure: KNEE ARTHROSCOPY WITH PARTIAL LATERAL MENISECTOMY;  Surgeon: Mike Hearing, MD;  Location: [facility];  Service: Orthopedics;  Laterality: Right;    Subjective Symptoms/Limitations Symptoms: Pt is a 72 year old male referred to PT s/p Rt knee scope on 01/21/13.  His operative findings severe arthritis of lateral compartment grade 4 chondral lesion of the tibial plateau grade 3 chondral lesion of the trochlea and medial compartment was normal the anterior cruciate ligament and PCL were intact there was a tear the lateral meniscus anterior and body.  His c/co is stiffness to his knee and feels he is getting better each day.  He reports he has a stationary bike at home and is doing some AROM exercises at home and would like to have an HEP he can continue at home.  At this time he feels he would be able to do what he needs to do for work.  Explained to pt importance of healing.  Patient Stated Goals: return to work. Pain Assessment Currently in Pain?: Yes Pain Score:  ("Stiffness" ) Pain Location:  (knee) Pain Orientation: Right Pain Type: Surgical pain Pain Relieving Factors: not  taking pain medication, using ice 4x/day and exercising his knee 4x a day (using a stationary bike)  Balance Screening Balance Screen Has the patient fallen in the past 6 months: No Has the patient had a decrease in activity level because of a fear of falling? : Yes Is the patient reluctant to leave their home because of a fear of falling? 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  At this time pt strength and Rt knee AROM are WNL.  Has mild joint effusion.  he is independent with exercises at home and updated with more exercises to do at home for safe return to work activities.  Discussed with patient progression of exercises and to gradually build up endurance and strength and continue with ice to decrease risk of increased knee swelling.  Will d/c pt with an HEP.  PT Plan: D/C    Goals Home Exercise Program  Pt/caregiver will Perform Home Exercise Program: Independently PT Goal: Perform Home Exercise Program - Progress: Met  Problem List Patient Active Problem List   Diagnosis Date Noted  . Lateral meniscus derangement 01/12/2013  . Osteoarthritis of right knee 01/12/2013    PT - End of Session Activity Tolerance: Patient tolerated treatment well PT Plan of Care PT Home Exercise Plan: given PT Patient Instructions: discussed HEP, walking for exercises for return to work  activities.  Consulted and Agree with Plan of Care: Patient  GP Functional Assessment Tool Used: FOTO: 79/21 Functional Limitation: Mobility: Walking and moving around Mobility: Walking and Moving Around Current Status ([id number]): At least 20 percent but less than 40 percent impaired, limited or restricted Mobility: Walking and Moving Around Goal Status [phone]): At least 20 percent but less than 40 percent impaired, limited or restricted Mobility: Walking and Moving Around Discharge Status [phone]): At least 20 percent but less than 40 percent impaired, limited or restricted  Mike Roach 01/26/2013, 1:46 PM  Physician Documentation Your signature is required to indicate approval of the treatment plan as stated above.  Please sign and either send electronically or make a copy of this report for your files and return this physician signed original.   Please mark one 1.__approve of plan  2. ___approve of plan with the following conditions.   ______________________________                                                          _____________________ Physician Signature                                                                                                             Date

## 2013-03-01 ENCOUNTER — Ambulatory Visit (INDEPENDENT_AMBULATORY_CARE_PROVIDER_SITE_OTHER): Payer: Self-pay | Admitting: Orthopedic Surgery

## 2013-03-01 ENCOUNTER — Encounter: Payer: Self-pay | Admitting: Orthopedic Surgery

## 2013-03-01 VITALS — BP 152/86 | Ht 74.5 in | Wt 218.0 lb

## 2013-03-01 DIAGNOSIS — M1710 Unilateral primary osteoarthritis, unspecified knee: Secondary | ICD-10-CM

## 2013-03-01 DIAGNOSIS — M23302 Other meniscus derangements, unspecified lateral meniscus, unspecified knee: Secondary | ICD-10-CM

## 2013-03-01 DIAGNOSIS — IMO0002 Reserved for concepts with insufficient information to code with codable children: Secondary | ICD-10-CM

## 2013-03-01 DIAGNOSIS — M1711 Unilateral primary osteoarthritis, right knee: Secondary | ICD-10-CM

## 2013-03-01 DIAGNOSIS — M23300 Other meniscus derangements, unspecified lateral meniscus, right knee: Secondary | ICD-10-CM

## 2013-03-01 NOTE — Progress Notes (Signed)
Patient ID: Mike Roach, male   DOB: Dec 13, 1940, 72 y.o.   MRN: [id number]  Chief Complaint  Patient presents with  . Follow-up    Post op 2 SARK DOS 01/21/13    The patient had a lateral meniscal tear along with osteoarthritis. He is doing much better now has some pain when he stands on his legs for a long time but otherwise do his own physical therapy and return to normal activity  His knee looks good he has no effusion and full extension with good strength  Encounter Diagnoses  Name Primary?  . Lateral meniscus derangement, right Yes  . Osteoarthritis of right knee    Return in 6 months for x-ray of the knee. He will need a knee replacement sometime in the future when the pain becomes unbearable.

## 2013-03-01 NOTE — Patient Instructions (Signed)
Return to work  

## 2013-08-30 ENCOUNTER — Ambulatory Visit (INDEPENDENT_AMBULATORY_CARE_PROVIDER_SITE_OTHER): Payer: Medicare Other

## 2013-08-30 ENCOUNTER — Ambulatory Visit (INDEPENDENT_AMBULATORY_CARE_PROVIDER_SITE_OTHER): Payer: Medicare Other | Admitting: Orthopedic Surgery

## 2013-08-30 VITALS — BP 156/91 | Ht 74.5 in | Wt 218.0 lb

## 2013-08-30 DIAGNOSIS — M1610 Unilateral primary osteoarthritis, unspecified hip: Secondary | ICD-10-CM

## 2013-08-30 DIAGNOSIS — M169 Osteoarthritis of hip, unspecified: Secondary | ICD-10-CM

## 2013-08-30 MED ORDER — MELOXICAM 15 MG PO TABS: 15.0000 mg | ORAL_TABLET | Freq: Every day | ORAL | Status: DC

## 2013-08-30 NOTE — Progress Notes (Signed)
Patient ID: Mike Roach, male   DOB: 1940/04/11, 73 y.o.   MRN: [id number] Chief Complaint  Patient presents with  . 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 He says occasionally he will take a meloxicam but he ran out of his prescription  Review of systems no catching locking or giving way occasional swelling   General the patient is well-developed and well-nourished grooming and hygiene are normal Oriented x3 Mood and affect normal Inspection of the right knee valgus deformity no tenderness no swelling Range of motion flexion ARC 125 All joints are stable Motor exam is normal Skin clean dry and intact  Cardiovascular exam is normal Sensory exam normal  Stable valgus arthritis right knee status post arthroscopy and lateral meniscectomy doing well followup in a year for x-ray  Meds ordered this encounter  Medications  . meloxicam (MOBIC) 15 MG tablet    Sig: Take 1 tablet (15 mg total) by mouth daily.    Dispense:  60 tablet    Refill:  5

## 2014-01-17 ENCOUNTER — Ambulatory Visit (INDEPENDENT_AMBULATORY_CARE_PROVIDER_SITE_OTHER): Payer: Medicare Other | Admitting: Urology

## 2014-01-17 DIAGNOSIS — R972 Elevated prostate specific antigen [PSA]: Secondary | ICD-10-CM

## 2014-01-17 DIAGNOSIS — N5201 Erectile dysfunction due to arterial insufficiency: Secondary | ICD-10-CM

## 2014-01-17 DIAGNOSIS — N400 Enlarged prostate without lower urinary tract symptoms: Secondary | ICD-10-CM

## 2014-02-07 ENCOUNTER — Ambulatory Visit (INDEPENDENT_AMBULATORY_CARE_PROVIDER_SITE_OTHER): Payer: Medicare Other | Admitting: Urology

## 2014-02-07 DIAGNOSIS — N400 Enlarged prostate without lower urinary tract symptoms: Secondary | ICD-10-CM

## 2014-02-07 DIAGNOSIS — N5201 Erectile dysfunction due to arterial insufficiency: Secondary | ICD-10-CM

## 2014-09-05 ENCOUNTER — Ambulatory Visit (INDEPENDENT_AMBULATORY_CARE_PROVIDER_SITE_OTHER): Payer: Medicare Other | Admitting: Orthopedic Surgery

## 2014-09-05 ENCOUNTER — Encounter: Payer: Self-pay | Admitting: Orthopedic Surgery

## 2014-09-05 ENCOUNTER — Ambulatory Visit (INDEPENDENT_AMBULATORY_CARE_PROVIDER_SITE_OTHER): Payer: Medicare Other

## 2014-09-05 VITALS — BP 121/72 | Ht 74.5 in | Wt 218.0 lb

## 2014-09-05 DIAGNOSIS — M1711 Unilateral primary osteoarthritis, right knee: Secondary | ICD-10-CM | POA: Diagnosis not present

## 2014-09-05 NOTE — Progress Notes (Signed)
Patient ID: Mike Roach, male   DOB: May 01, 1940, 74 y.o.   MRN: [id number]  Chief Complaint  Patient presents with  . Follow-up    1 year follow up + xray right knee, SARK W/ LM 01/21/13    HPI Mike Roach is a 74 y.o. male.  Presents for evaluation of his right knee status post arthroscopy. Currently on meloxicam. Here for x-rays today.  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 Past Medical History  Diagnosis Date  . Glaucoma   . GERD (gastroesophageal reflux disease)   . Hypercholesterolemia   . Hypertension   . Arthritis     Past Surgical History  Procedure Laterality Date  . Orif ankle dislocation Right   . Knee arthroscopy with lateral menisectomy Right 01/21/2013    Procedure: KNEE ARTHROSCOPY WITH PARTIAL LATERAL MENISECTOMY;  Surgeon: Carole Civil, MD;  Location: [facility];  Service: Orthopedics;  Laterality: Right;     No Known Allergies  Current Outpatient Prescriptions  Medication Sig Dispense Refill  . finasteride (PROSCAR) 5 MG tablet Take 5 mg by mouth daily.    Marland Kitchen lisinopril (PRINIVIL,ZESTRIL) 40 MG tablet Take 20 mg by mouth daily.     Marland Kitchen omeprazole (PRILOSEC) 20 MG capsule Take 20 mg by mouth daily.    . simvastatin (ZOCOR) 20 MG tablet Take 20 mg by mouth daily.    . dorzolamide (TRUSOPT) 2 % ophthalmic solution Place 1 drop into both eyes 2 (two) times daily.     Marland Kitchen HYDROcodone-acetaminophen (NORCO) 7.5-325 MG per tablet Take 1 tablet by mouth every 4 (four) hours as needed for moderate pain. 30 tablet 0  . meloxicam (MOBIC) 15 MG tablet Take 1 tablet (15 mg total) by mouth daily. 60 tablet 5  . Multiple Vitamins-Minerals (MULTIVITAMINS THER. W/MINERALS) TABS tablet Take 1 tablet by mouth daily.    . Omega-3 Fatty Acids (FISH OIL PO) Take 4 capsules by mouth daily.    . promethazine (PHENERGAN) 12.5 MG tablet Take 1 tablet (12.5  mg total) by mouth every 6 (six) hours as needed for nausea or vomiting. 30 tablet 0  . travoprost, benzalkonium, (TRAVATAN) 0.004 % ophthalmic solution Place 1 drop into both eyes at bedtime.      No current facility-administered medications for this visit.    Review of Systems Review of Systems  Occasional knee joint swelling  Physical Exam Blood pressure 121/72, height 6' 2.5" (1.892 m), weight 218 lb (98.884 kg).  Physical Exam The limb is in valgus alignment. The flexion 125 measured by goniometer. Ligaments are stable. Knee extension strength is normal. Skin is intact. Normal sensation. Normal pulse.   Data Reviewed Today's x-ray shows valgus arthritis with no progression compared to the previous film  Assessment    Stable Encounter Diagnosis  Name Primary?  . Primary osteoarthritis of right knee Yes       Plan    Return 1 year for x-ray or sooner if he gets more symptoms       Arther Abbott 09/05/2014, 9:34 AM

## 2014-09-21 DIAGNOSIS — D126 Benign neoplasm of colon, unspecified: Secondary | ICD-10-CM

## 2014-09-21 HISTORY — DX: Benign neoplasm of colon, unspecified: D12.6

## 2015-02-13 ENCOUNTER — Ambulatory Visit (INDEPENDENT_AMBULATORY_CARE_PROVIDER_SITE_OTHER): Payer: Medicare Other | Admitting: Urology

## 2015-02-13 DIAGNOSIS — N400 Enlarged prostate without lower urinary tract symptoms: Secondary | ICD-10-CM | POA: Diagnosis not present

## 2015-02-13 DIAGNOSIS — N5201 Erectile dysfunction due to arterial insufficiency: Secondary | ICD-10-CM | POA: Diagnosis not present

## 2015-02-13 DIAGNOSIS — R972 Elevated prostate specific antigen [PSA]: Secondary | ICD-10-CM | POA: Diagnosis not present

## 2015-03-06 ENCOUNTER — Telehealth: Payer: Self-pay | Admitting: Hematology and Oncology

## 2015-03-06 NOTE — Telephone Encounter (Signed)
new patient appt-s/w patient and gave np appt for 1/04 @ 11 w/Dr. Alvy Bimler Referring Dr. Matthias Hughs Dx- anemia, dec'd hgb

## 2015-03-07 ENCOUNTER — Telehealth: Payer: Self-pay | Admitting: Hematology and Oncology

## 2015-03-07 ENCOUNTER — Encounter: Payer: Self-pay | Admitting: Hematology and Oncology

## 2015-03-07 ENCOUNTER — Ambulatory Visit (HOSPITAL_BASED_OUTPATIENT_CLINIC_OR_DEPARTMENT_OTHER): Payer: Medicare HMO | Admitting: Hematology and Oncology

## 2015-03-07 ENCOUNTER — Ambulatory Visit (HOSPITAL_BASED_OUTPATIENT_CLINIC_OR_DEPARTMENT_OTHER): Payer: Medicare HMO

## 2015-03-07 VITALS — BP 145/68 | HR 92 | Temp 98.0°F | Resp 18 | Ht 74.5 in | Wt 210.9 lb

## 2015-03-07 DIAGNOSIS — D638 Anemia in other chronic diseases classified elsewhere: Secondary | ICD-10-CM

## 2015-03-07 DIAGNOSIS — M2550 Pain in unspecified joint: Secondary | ICD-10-CM

## 2015-03-07 DIAGNOSIS — N182 Chronic kidney disease, stage 2 (mild): Secondary | ICD-10-CM

## 2015-03-07 DIAGNOSIS — L989 Disorder of the skin and subcutaneous tissue, unspecified: Secondary | ICD-10-CM

## 2015-03-07 DIAGNOSIS — N183 Chronic kidney disease, stage 3 unspecified: Secondary | ICD-10-CM | POA: Insufficient documentation

## 2015-03-07 DIAGNOSIS — K219 Gastro-esophageal reflux disease without esophagitis: Secondary | ICD-10-CM

## 2015-03-07 DIAGNOSIS — N189 Chronic kidney disease, unspecified: Secondary | ICD-10-CM

## 2015-03-07 DIAGNOSIS — G8929 Other chronic pain: Secondary | ICD-10-CM

## 2015-03-07 DIAGNOSIS — Z87891 Personal history of nicotine dependence: Secondary | ICD-10-CM

## 2015-03-07 DIAGNOSIS — I10 Essential (primary) hypertension: Secondary | ICD-10-CM | POA: Diagnosis not present

## 2015-03-07 HISTORY — DX: Chronic kidney disease, unspecified: N18.9

## 2015-03-07 HISTORY — DX: Chronic kidney disease, stage 3 unspecified: N18.30

## 2015-03-07 HISTORY — DX: Disorder of the skin and subcutaneous tissue, unspecified: L98.9

## 2015-03-07 HISTORY — DX: Anemia in other chronic diseases classified elsewhere: D63.8

## 2015-03-07 LAB — COMPREHENSIVE METABOLIC PANEL
ALT: 17 U/L (ref 0–55)
AST: 19 U/L (ref 5–34)
Albumin: 3.4 g/dL — ABNORMAL LOW (ref 3.5–5.0)
Alkaline Phosphatase: 71 U/L (ref 40–150)
Anion Gap: 7 mEq/L (ref 3–11)
BILIRUBIN TOTAL: 0.34 mg/dL (ref 0.20–1.20)
BUN: 20.5 mg/dL (ref 7.0–26.0)
CHLORIDE: 110 meq/L — AB (ref 98–109)
CO2: 24 meq/L (ref 22–29)
CREATININE: 1.6 mg/dL — AB (ref 0.7–1.3)
Calcium: 9.2 mg/dL (ref 8.4–10.4)
EGFR: 50 mL/min/{1.73_m2} — AB (ref >90)
GLUCOSE: 97 mg/dL (ref 70–140)
Potassium: 4.1 mEq/L (ref 3.5–5.1)
SODIUM: 141 meq/L (ref 136–145)
TOTAL PROTEIN: 7.5 g/dL (ref 6.4–8.3)

## 2015-03-07 LAB — IRON AND TIBC
%SAT: 7 % — ABNORMAL LOW (ref 20–55)
Iron: 24 ug/dL — ABNORMAL LOW (ref 42–163)
TIBC: 326 ug/dL (ref 202–409)
UIBC: 302 ug/dL (ref 117–376)

## 2015-03-07 LAB — CBC & DIFF AND RETIC
BASO%: 0.7 % (ref 0.0–2.0)
Basophils Absolute: 0 10*3/uL (ref 0.0–0.1)
EOS%: 3.2 % (ref 0.0–7.0)
Eosinophils Absolute: 0.2 10*3/uL (ref 0.0–0.5)
HCT: 28.5 % — ABNORMAL LOW (ref 38.4–49.9)
HGB: 9.3 g/dL — ABNORMAL LOW (ref 13.0–17.1)
IMMATURE RETIC FRACT: 15.2 % — AB (ref 3.00–10.60)
LYMPH#: 1.5 10*3/uL (ref 0.9–3.3)
LYMPH%: 27.5 % (ref 14.0–49.0)
MCH: 30.3 pg (ref 27.2–33.4)
MCHC: 32.6 g/dL (ref 32.0–36.0)
MCV: 92.8 fL (ref 79.3–98.0)
MONO#: 0.5 10*3/uL (ref 0.1–0.9)
MONO%: 9.1 % (ref 0.0–14.0)
NEUT%: 59.5 % (ref 39.0–75.0)
NEUTROS ABS: 3.2 10*3/uL (ref 1.5–6.5)
Platelets: 260 10*3/uL (ref 140–400)
RBC: 3.07 10*6/uL — AB (ref 4.20–5.82)
RDW: 14.8 % — AB (ref 11.0–14.6)
RETIC CT ABS: 32.24 10*3/uL — AB (ref 34.80–93.90)
Retic %: 1.05 % (ref 0.80–1.80)
WBC: 5.4 10*3/uL (ref 4.0–10.3)

## 2015-03-07 NOTE — Telephone Encounter (Signed)
Patient sent to lab and given avs report and appointments for January. Patient also given appointment with Dr. Harriett Sine at [facility] 03/16/15 @ 11:20 am to arrive at 11 am - [address] [place], [place] [address] [phone].

## 2015-03-08 NOTE — Assessment & Plan Note (Signed)
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 He will continue medical management.

## 2015-03-08 NOTE — Assessment & Plan Note (Signed)
This is highly unusual, but the skin lesions look like erythema nodosum. i recommend dermatology review and possible skin biopsy. With his joint pain, I will also order ANA screen for autoimmune disorder.

## 2015-03-08 NOTE — Assessment & Plan Note (Signed)
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 I will order additional workup for this.

## 2015-03-08 NOTE — Progress Notes (Signed)
[facility] NOTE  Patient Care Team: Zella Richer. Scotty Court, MD as PCP - General (Family Medicine)  CHIEF COMPLAINTS/PURPOSE OF CONSULTATION:  Progressive anemia  HISTORY OF PRESENTING ILLNESS:  Mike Roach 75 y.o. male is here because of progressive anemia. I have blood work sent from his 55 office for review.  The patient was oriented to have progressive anemia over the past few years. His blood work dated November 2014 was borderline low at hemoglobin of 11.8. He denies recent chest pain on exertion, shortness of breath on minimal exertion, pre-syncopal episodes, or palpitations. He had not noticed any recent bleeding such as epistaxis, hematuria or hematochezia The patient denies over the counter NSAID ingestion. He is on antiplatelets agents. His last colonoscopy was recently and only polyps is found. He has chronic GERD but never had upper endoscopy. He had no prior history or diagnosis of cancer. His age appropriate screening programs are up-to-date. He denies any pica and eats a variety of diet. He never donated blood or received blood transfusion He has chronic arthritis/joint pain. He noticed some abnormal skin lesion on the left lower leg.  MEDICAL HISTORY:  Past Medical History  Diagnosis Date  . Glaucoma   . GERD (gastroesophageal reflux disease)   . Hypercholesterolemia   . Hypertension   . Arthritis   . Chronic kidney disease   . Skin lesion of left leg 03/07/2015  . Anemia in chronic illness 03/07/2015  . Chronic kidney disease (CKD) 03/07/2015    SURGICAL HISTORY: Past Surgical History  Procedure Laterality Date  . Orif ankle dislocation Right   . Knee arthroscopy with lateral menisectomy Right 01/21/2013    Procedure: KNEE ARTHROSCOPY WITH PARTIAL LATERAL MENISECTOMY;  Surgeon: Carole Civil, MD;  Location: [facility];  Service: Orthopedics;  Laterality: Right;  . Colonoscopy      SOCIAL HISTORY: Social History   Social History   . Marital Status: Divorced    Spouse Name: N/A  . Number of Children: N/A  . Years of Education: N/A   Occupational History  . Not on file.   Social History Main Topics  . Smoking status: Former Smoker -- 1.00 packs/day for 20 years    Types: Cigarettes    Quit date: 01/19/1981  . Smokeless tobacco: Never Used  . Alcohol Use: No  . Drug Use: No  . 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 Not on file   Social History Narrative    FAMILY HISTORY: History reviewed. No pertinent family history.  ALLERGIES:  has No Known Allergies.  MEDICATIONS:  Current Outpatient Prescriptions  Medication Sig Dispense Refill  . aspirin 81 MG tablet Take 81 mg by mouth daily.    . dorzolamide (TRUSOPT) 2 % ophthalmic solution Place 1 drop into both eyes 2 (two) times daily.     Marland Kitchen lisinopril (PRINIVIL,ZESTRIL) 40 MG tablet Take 20 mg by mouth daily.     . Multiple Vitamins-Minerals (MULTIVITAMINS THER. W/MINERALS) TABS tablet Take 1 tablet by mouth daily.    . Omega-3 Fatty Acids (FISH OIL PO) Take 4 capsules by mouth daily.    Marland Kitchen omeprazole (PRILOSEC) 20 MG capsule Take 20 mg by mouth daily.    . simvastatin (ZOCOR) 20 MG tablet Take 20 mg by mouth daily.    . travoprost, benzalkonium, (TRAVATAN) 0.004 % ophthalmic solution Place 1 drop  into both eyes at bedtime.      No current facility-administered medications for this visit.    REVIEW OF SYSTEMS:   Constitutional: Denies fevers, chills or abnormal night sweats Eyes: Denies blurriness of vision, double vision or watery eyes Ears, nose, mouth, throat, and face: Denies mucositis or sore throat Respiratory: Denies cough, dyspnea or wheezes Cardiovascular: Denies palpitation, chest discomfort or lower extremity swelling Gastrointestinal:  Denies nausea change in bowel habits Lymphatics: Denies new lymphadenopathy or easy  bruising Neurological:Denies numbness, tingling or new weaknesses Behavioral/Psych: Mood is stable, no new changes  All other systems were reviewed with the patient and are negative.  PHYSICAL EXAMINATION: ECOG PERFORMANCE STATUS: 1 - Symptomatic but completely ambulatory  Filed Vitals:   03/07/15 1153  BP: 145/68  Pulse: 92  Temp: 98 F (36.7 C)  Resp: 18   Filed Weights   03/07/15 1153  Weight: 210 lb 14.4 oz (95.664 kg)    GENERAL:alert, no distress and comfortable SKIN: Noted nodular skin lesion on the left lower leg which looks like erythema nodosum. EYES: normal, conjunctiva are pink and non-injected, sclera clear OROPHARYNX:no exudate, no erythema and lips, buccal mucosa, and tongue normal  NECK: supple, thyroid normal size, non-tender, without nodularity LYMPH:  no palpable lymphadenopathy in the cervical, axillary or inguinal LUNGS: clear to auscultation and percussion with normal breathing effort HEART: regular rate & rhythm and no murmurs and no lower extremity edema ABDOMEN:abdomen soft, non-tender and normal bowel sounds Musculoskeletal:no cyanosis of digits and no clubbing  PSYCH: alert & oriented x 3 with fluent speech NEURO: no focal motor/sensory deficits  LABORATORY DATA:  I have reviewed the data as listed Recent Results (from the past 2160 hour(s))  CBC & Diff and Retic     Status: Abnormal   Collection Time: 03/07/15 12:45 PM  Result Value Ref Range   WBC 5.4 4.0 - 10.3 10e3/uL   NEUT# 3.2 1.5 - 6.5 10e3/uL   HGB 9.3 (L) 13.0 - 17.1 g/dL   HCT 28.5 (L) 38.4 - 49.9 %   Platelets 260 140 - 400 10e3/uL   MCV 92.8 79.3 - 98.0 fL   MCH 30.3 27.2 - 33.4 pg   MCHC 32.6 32.0 - 36.0 g/dL   RBC 3.07 (L) 4.20 - 5.82 10e6/uL   RDW 14.8 (H) 11.0 - 14.6 %   lymph# 1.5 0.9 - 3.3 10e3/uL   MONO# 0.5 0.1 - 0.9 10e3/uL   Eosinophils Absolute 0.2 0.0 - 0.5 10e3/uL   Basophils Absolute 0.0 0.0 - 0.1 10e3/uL   NEUT% 59.5 39.0 - 75.0 %   LYMPH% 27.5 14.0 - 49.0 %    MONO% 9.1 0.0 - 14.0 %   EOS% 3.2 0.0 - 7.0 %   BASO% 0.7 0.0 - 2.0 %   Retic % 1.05 0.80 - 1.80 %   Retic Ct Abs 32.24 (L) 34.80 - 93.90 10e3/uL   Immature Retic Fract 15.20 (H) 3.00 - 10.60 %  Iron and TIBC     Status: Abnormal   Collection Time: 03/07/15 12:45 PM  Result Value Ref Range   Iron 24 (L) 42 - 163 ug/dL   TIBC 326 202 - 409 ug/dL   UIBC 302 117 - 376 ug/dL   %SAT 7 (L) 20 - 55 %  Comprehensive metabolic panel     Status: Abnormal   Collection Time: 03/07/15 12:45 PM  Result Value Ref Range   Sodium 141 136 - 145 mEq/L   Potassium 4.1 3.5 - 5.1  mEq/L   Chloride 110 (H) 98 - 109 mEq/L   CO2 24 22 - 29 mEq/L   Glucose 97 70 - 140 mg/dl    Comment: Glucose reference range is for nonfasting patients. Fasting glucose reference range is 70- 100.   BUN 20.5 7.0 - 26.0 mg/dL   Creatinine 1.6 (H) 0.7 - 1.3 mg/dL   Total Bilirubin 0.34 0.20 - 1.20 mg/dL   Alkaline Phosphatase 71 40 - 150 U/L   AST 19 5 - 34 U/L   ALT 17 0 - 55 U/L   Total Protein 7.5 6.4 - 8.3 g/dL   Albumin 3.4 (L) 3.5 - 5.0 g/dL   Calcium 9.2 8.4 - 10.4 mg/dL   Anion Gap 7 3 - 11 mEq/L   EGFR 50 (L) >90 ml/min/1.73 m2    Comment: eGFR is calculated using the CKD-EPI Creatinine Equation (2009)  Sedimentation rate     Status: Abnormal (Preliminary result)   Collection Time: 03/07/15 12:48 PM  Result Value Ref Range   Sed Rate 51 (H) 0 - 20 mm/hr  ANA     Status: None (Preliminary result)   Collection Time: 03/07/15 12:48 PM  Result Value Ref Range   Anit Nuclear Antibody(ANA) NEG NEGATIVE    ASSESSMENT & PLAN:  Anemia in chronic illness I suspect the most likely cause is anemia of chronic kidney disease. I will order additional workup for this.  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 He will continue medical management.  Skin lesion of left leg This is highly unusual, but the skin lesions look like erythema  nodosum. i recommend dermatology review and possible skin biopsy. With his joint pain, I will also order ANA screen for autoimmune disorder.    All questions were answered. The patient knows to call the clinic with any problems, questions or concerns. I spent 30 minutes counseling the patient face to face. The total time spent in the appointment was 40 minutes and more than 50% was on counseling.     [facility], [place], MD 03/08/2015 3:08 PM

## 2015-03-09 LAB — ERYTHROPOIETIN: Erythropoietin: 38.3 m[IU]/mL — ABNORMAL HIGH (ref 2.6–18.5)

## 2015-03-09 LAB — SEDIMENTATION RATE: SED RATE: 51 mm/h — AB (ref 0–20)

## 2015-03-09 LAB — ANA: Anti Nuclear Antibody(ANA): NEGATIVE

## 2015-03-15 ENCOUNTER — Encounter: Payer: Self-pay | Admitting: Hematology and Oncology

## 2015-03-15 ENCOUNTER — Telehealth: Payer: Self-pay | Admitting: Hematology and Oncology

## 2015-03-15 ENCOUNTER — Ambulatory Visit (HOSPITAL_BASED_OUTPATIENT_CLINIC_OR_DEPARTMENT_OTHER): Payer: Medicare HMO | Admitting: Hematology and Oncology

## 2015-03-15 VITALS — BP 134/62 | HR 85 | Temp 98.2°F | Resp 18 | Ht 74.5 in | Wt 210.0 lb

## 2015-03-15 DIAGNOSIS — K219 Gastro-esophageal reflux disease without esophagitis: Secondary | ICD-10-CM

## 2015-03-15 DIAGNOSIS — D509 Iron deficiency anemia, unspecified: Secondary | ICD-10-CM

## 2015-03-15 DIAGNOSIS — N183 Chronic kidney disease, stage 3 unspecified: Secondary | ICD-10-CM

## 2015-03-15 DIAGNOSIS — H9193 Unspecified hearing loss, bilateral: Secondary | ICD-10-CM | POA: Insufficient documentation

## 2015-03-15 DIAGNOSIS — D638 Anemia in other chronic diseases classified elsewhere: Secondary | ICD-10-CM | POA: Diagnosis not present

## 2015-03-15 DIAGNOSIS — D649 Anemia, unspecified: Secondary | ICD-10-CM

## 2015-03-15 HISTORY — DX: Iron deficiency anemia, unspecified: D50.9

## 2015-03-15 HISTORY — DX: Unspecified hearing loss, bilateral: H91.93

## 2015-03-15 NOTE — Telephone Encounter (Signed)
Gv pt appt for 3/8. Gave appt for 1/17 @ 10.30am with [facility] and advised we will call with GI referral. Per [facility], must send previous records from colonoscopy prior to appt being scheduled.

## 2015-03-16 NOTE — Progress Notes (Signed)
[facility] OFFICE PROGRESS NOTE  TAPPER,DAVID B, MD SUMMARY OF HEMATOLOGIC HISTORY:  Mike Roach 75 y.o. male is here because of progressive anemia. I have blood work sent from his 43 office for review.  The patient was oriented to have progressive anemia over the past few years. His blood work dated November 2014 was borderline low at hemoglobin of 11.8. He denies recent chest pain on exertion, shortness of breath on minimal exertion, pre-syncopal episodes, or palpitations. He had not noticed any recent bleeding such as epistaxis, hematuria or hematochezia The patient denies over the counter NSAID ingestion. He is on antiplatelets agents. His last colonoscopy was recently and only polyps is found. He has chronic GERD but never had upper endoscopy. He had no prior history or diagnosis of cancer. His age appropriate screening programs are up-to-date. He denies any pica and eats a variety of diet. He never donated blood or received blood transfusion He has chronic arthritis/joint pain. He noticed some abnormal skin lesion on the left lower leg.  INTERVAL HISTORY: Mike Roach 75 y.o. male returns for  Further follow-up. He feels well. The patient denies any recent signs or symptoms of bleeding such as spontaneous epistaxis, hematuria or hematochezia.   I have reviewed the past medical history, past surgical history, social history and family history with the patient and they are unchanged from previous note.  ALLERGIES:  has No Known Allergies.  MEDICATIONS:  Current Outpatient Prescriptions  Medication Sig Dispense Refill  . aspirin 81 MG tablet Take 81 mg by mouth daily.    . dorzolamide (TRUSOPT) 2 % ophthalmic solution Place 1 drop into both eyes 2 (two) times daily.     Marland Kitchen lisinopril (PRINIVIL,ZESTRIL) 40 MG tablet Take 20 mg by mouth daily.     . Multiple Vitamins-Minerals (MULTIVITAMINS THER. W/MINERALS) TABS tablet Take 1 tablet by mouth daily.    .  Omega-3 Fatty Acids (FISH OIL PO) Take 4 capsules by mouth daily.    Marland Kitchen omeprazole (PRILOSEC) 20 MG capsule Take 20 mg by mouth daily.    . simvastatin (ZOCOR) 20 MG tablet Take 20 mg by mouth daily.    . travoprost, benzalkonium, (TRAVATAN) 0.004 % ophthalmic solution Place 1 drop into both eyes at bedtime.      No current facility-administered medications for this visit.     REVIEW OF SYSTEMS:   Constitutional: Denies fevers, chills or night sweats Eyes: Denies blurriness of vision Ears, nose, mouth, throat, and face: Denies mucositis or sore throat Respiratory: Denies cough, dyspnea or wheezes Cardiovascular: Denies palpitation, chest discomfort or lower extremity swelling Gastrointestinal:  Denies nausea, heartburn or change in bowel habits Skin: Denies abnormal skin rashes Lymphatics: Denies new lymphadenopathy or easy bruising Neurological:Denies numbness, tingling or new weaknesses Behavioral/Psych: Mood is stable, no new changes  All other systems were reviewed with the patient and are negative.  PHYSICAL EXAMINATION: ECOG PERFORMANCE STATUS: 0 - Asymptomatic  Filed Vitals:   03/15/15 1012  BP: 134/62  Pulse: 85  Temp: 98.2 F (36.8 C)  Resp: 18   Filed Weights   03/15/15 1012  Weight: 210 lb (95.255 kg)    GENERAL:alert, no distress and comfortable SKIN: skin color, texture, turgor are normal, no rashes or significant lesions EYES: normal, Conjunctiva are pink and non-injected, sclera clear ABDOMEN:abdomen soft, non-tender and normal bowel sounds Musculoskeletal:no cyanosis of digits and no clubbing  NEURO: alert & oriented x 3 with fluent speech, no focal motor/sensory deficits  LABORATORY DATA:  I  have reviewed the data as listed No results found for this or any previous visit (from the past 48 hour(s)).  Lab Results  Component Value Date   WBC 5.4 03/07/2015   HGB 9.3* 03/07/2015   HCT 28.5* 03/07/2015   MCV 92.8 03/07/2015   PLT 260 03/07/2015    ASSESSMENT & PLAN:  Iron deficiency anemia  The patient has multifactorial anemia, iron deficiency anemia and anemia of chronic disease. He had colonoscopy done last year but never had EGD. The patient have reflux symptoms. I recommend upper GI evaluation and the patient requests that I refer him to a local gastroenterologist for further evaluation.  in the meantime, I recommend he take oral iron supplements. I plan to see him back in  2 months for further assessment.  Bilateral hearing loss  He has chronic bilateral hearing loss and request ENT referral for hearing aid. I will refer him to local ENT for assessment.  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 He will continue medical management.     All questions were answered. The patient knows to call the clinic with any problems, questions or concerns. No barriers to learning was detected.  I spent 15 minutes counseling the patient face to face. The total time spent in the appointment was 20 minutes and more than 50% was on counseling.     [facility], Mammie Meras, MD 1/13/20174:13 PM

## 2015-03-16 NOTE — Assessment & Plan Note (Signed)
He has chronic bilateral hearing loss and request ENT referral for hearing aid. I will refer him to local ENT for assessment.

## 2015-03-16 NOTE — Assessment & Plan Note (Signed)
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 He will continue medical management.

## 2015-03-16 NOTE — Assessment & Plan Note (Signed)
The patient has multifactorial anemia, iron deficiency anemia and anemia of chronic disease. He had colonoscopy done last year but never had EGD. The patient have reflux symptoms. I recommend upper GI evaluation and the patient requests that I refer him to a local gastroenterologist for further evaluation.  in the meantime, I recommend he take oral iron supplements. I plan to see him back in  2 months for further assessment.

## 2015-03-20 DIAGNOSIS — H9313 Tinnitus, bilateral: Secondary | ICD-10-CM | POA: Diagnosis not present

## 2015-03-20 DIAGNOSIS — H833X1 Noise effects on right inner ear: Secondary | ICD-10-CM | POA: Diagnosis not present

## 2015-03-20 DIAGNOSIS — H6121 Impacted cerumen, right ear: Secondary | ICD-10-CM | POA: Diagnosis not present

## 2015-03-20 DIAGNOSIS — H9113 Presbycusis, bilateral: Secondary | ICD-10-CM | POA: Diagnosis not present

## 2015-03-22 ENCOUNTER — Telehealth: Payer: Self-pay | Admitting: Hematology and Oncology

## 2015-03-22 NOTE — Telephone Encounter (Signed)
I lvm for pt regarding referral to [facility]. Pt had a colonscopy but could not remember what office it was done with. Velora Heckler will not schedule without having the pt's records. We do not have his colonoscopy records. In vm I asked the pt to contact other office and have them forward his records to [facility].

## 2015-03-28 DIAGNOSIS — L300 Nummular dermatitis: Secondary | ICD-10-CM | POA: Diagnosis not present

## 2015-03-29 ENCOUNTER — Telehealth: Payer: Self-pay | Admitting: *Deleted

## 2015-03-29 NOTE — Telephone Encounter (Signed)
Colonoscopy results from [facility] faxed to [facility]

## 2015-04-02 ENCOUNTER — Telehealth: Payer: Self-pay | Admitting: *Deleted

## 2015-04-03 ENCOUNTER — Telehealth: Payer: Self-pay | Admitting: Internal Medicine

## 2015-04-03 NOTE — Telephone Encounter (Signed)
Pt left VM yesterday afternoon and this morning to check on referral to GI MD?   I called over to [place] and s/w Scheduler,  Christie.  She says the Referral was not put in correctly so it did not go to their work que.  She can see referral now that she was notified of it and says pt can call her to make appt..  I called pt back and left him a VM w/ phone number to [facility] to make appt..  Asked him to call us back if any problems/questions.

## 2015-04-03 NOTE — Telephone Encounter (Signed)
Received GI records from [facility]. Records placed on Dr. Celesta Aver desk for review.

## 2015-04-03 NOTE — Telephone Encounter (Signed)
Called pt back and gave him Cameo's message.

## 2015-04-03 NOTE — Telephone Encounter (Signed)
Pt called asking about GI referral.

## 2015-04-04 ENCOUNTER — Encounter: Payer: Self-pay | Admitting: Internal Medicine

## 2015-04-24 DIAGNOSIS — H903 Sensorineural hearing loss, bilateral: Secondary | ICD-10-CM | POA: Diagnosis not present

## 2015-04-26 ENCOUNTER — Ambulatory Visit (INDEPENDENT_AMBULATORY_CARE_PROVIDER_SITE_OTHER): Payer: Medicare HMO | Admitting: Internal Medicine

## 2015-04-26 ENCOUNTER — Encounter: Payer: Self-pay | Admitting: Internal Medicine

## 2015-04-26 VITALS — BP 122/70 | HR 78 | Ht 74.5 in | Wt 209.2 lb

## 2015-04-26 DIAGNOSIS — D509 Iron deficiency anemia, unspecified: Secondary | ICD-10-CM | POA: Diagnosis not present

## 2015-04-26 NOTE — Assessment & Plan Note (Signed)
EGD to evaluate Had colonoscopy 09/2014 2 4 mm polyps The risks and benefits as well as alternatives of endoscopic procedure(s) have been discussed and reviewed. All questions answered. The patient agrees to proceed.

## 2015-04-26 NOTE — Patient Instructions (Signed)

## 2015-04-26 NOTE — Progress Notes (Signed)
  Referred by: [name], MD  Subjective:    Patient ID: Mike Roach, male    DOB: [birth year], 75 y.o.   MRN: [id number] Chief complaint: Anemia HPI The patient is a very nice elderly African-American man with a history of iron deficiency and chronic disease anemia. He is followed by Dr. Simeon Craft such. He has no active GI symptoms. He takes a PPI which controls his heartburn well. He had a colonoscopy in the summer of 2016 with 24 mm polyps removed, a Dr. Karlyn Agee performed this at [facility]. Medications, allergies, past medical history, past surgical history, family history and social history are reviewed and updated in the EMR.   Review of Systems As above. 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 All other review of systems are negative    Objective:   Physical Exam @BP  122/70 mmHg  Pulse 78  Ht 6' 2.5" (1.892 m)  Wt 209 lb 3.2 oz (94.892 kg)  BMI 26.51 kg/m2@  General:  NAD Eyes:   anicteric Lungs:  clear Heart::  S1S2 no rubs, murmurs or gallops Abdomen:  soft and nontender, BS+ Ext:   no edema, cyanosis or clubbing    Data Reviewed:   As above. I did look to the oncology chart and the labs in the EMR.     Assessment & Plan:   1. Iron deficiency anemia    EGD is appropriate to exclude other causes of iron deficiency anemia. That is negative I probably wouldn't pursue it further and just supplement. He had a colonoscopy in July that does not need to be repeated  The risks and benefits as well as alternatives of endoscopic procedure(s) have been discussed and reviewed. All questions answered. The patient agrees to proceed.  I appreciate the opportunity to care for this patient. CC: TAPPER,DAVID B, MD I will also send a copy to Dr. Alvy Bimler

## 2015-05-09 ENCOUNTER — Ambulatory Visit (HOSPITAL_BASED_OUTPATIENT_CLINIC_OR_DEPARTMENT_OTHER): Payer: Medicare HMO | Admitting: Hematology and Oncology

## 2015-05-09 ENCOUNTER — Other Ambulatory Visit: Payer: Self-pay | Admitting: Hematology and Oncology

## 2015-05-09 ENCOUNTER — Other Ambulatory Visit (HOSPITAL_BASED_OUTPATIENT_CLINIC_OR_DEPARTMENT_OTHER): Payer: Medicare HMO

## 2015-05-09 ENCOUNTER — Telehealth: Payer: Self-pay | Admitting: Hematology and Oncology

## 2015-05-09 ENCOUNTER — Encounter: Payer: Self-pay | Admitting: Hematology and Oncology

## 2015-05-09 VITALS — BP 115/58 | HR 78 | Temp 98.1°F | Resp 17 | Ht 74.5 in | Wt 203.9 lb

## 2015-05-09 DIAGNOSIS — D509 Iron deficiency anemia, unspecified: Secondary | ICD-10-CM | POA: Diagnosis not present

## 2015-05-09 DIAGNOSIS — K219 Gastro-esophageal reflux disease without esophagitis: Secondary | ICD-10-CM

## 2015-05-09 DIAGNOSIS — N183 Chronic kidney disease, stage 3 unspecified: Secondary | ICD-10-CM

## 2015-05-09 DIAGNOSIS — D638 Anemia in other chronic diseases classified elsewhere: Secondary | ICD-10-CM

## 2015-05-09 LAB — CBC & DIFF AND RETIC
BASO%: 1.1 % (ref 0.0–2.0)
BASOS ABS: 0.1 10*3/uL (ref 0.0–0.1)
EOS%: 9.1 % — ABNORMAL HIGH (ref 0.0–7.0)
Eosinophils Absolute: 0.5 10*3/uL (ref 0.0–0.5)
HEMATOCRIT: 29.4 % — AB (ref 38.4–49.9)
HEMOGLOBIN: 9.6 g/dL — AB (ref 13.0–17.1)
IMMATURE RETIC FRACT: 16.3 % — AB (ref 3.00–10.60)
LYMPH%: 27 % (ref 14.0–49.0)
MCH: 30.2 pg (ref 27.2–33.4)
MCHC: 32.7 g/dL (ref 32.0–36.0)
MCV: 92.5 fL (ref 79.3–98.0)
MONO#: 0.8 10*3/uL (ref 0.1–0.9)
MONO%: 14.5 % — ABNORMAL HIGH (ref 0.0–14.0)
NEUT#: 2.6 10*3/uL (ref 1.5–6.5)
NEUT%: 48.3 % (ref 39.0–75.0)
PLATELETS: 293 10*3/uL (ref 140–400)
RBC: 3.18 10*6/uL — AB (ref 4.20–5.82)
RDW: 16.3 % — ABNORMAL HIGH (ref 11.0–14.6)
Retic %: 1.02 % (ref 0.80–1.80)
Retic Ct Abs: 32.44 10*3/uL — ABNORMAL LOW (ref 34.80–93.90)
WBC: 5.3 10*3/uL (ref 4.0–10.3)
lymph#: 1.4 10*3/uL (ref 0.9–3.3)

## 2015-05-09 LAB — IRON AND TIBC
%SAT: 28 % (ref 20–55)
Iron: 68 ug/dL (ref 42–163)
TIBC: 246 ug/dL (ref 202–409)
UIBC: 178 ug/dL (ref 117–376)

## 2015-05-09 LAB — FERRITIN: Ferritin: 64 ng/ml (ref 22–316)

## 2015-05-09 NOTE — Assessment & Plan Note (Signed)
The patient continues to be anemic. I suspect he may have persistent iron deficiency anemia. Ferritin level is pending. I plan to give him 2 doses of IV Feraheme to correct the iron deficiency anemia. He has GI appointment pending. I plan to see him back in 2 months with repeat iron studies. 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 We discussed some of the risks, benefits, and alternatives of intravenous iron infusions. The patient is symptomatic from anemia and the iron level is critically low. He tolerated oral iron supplement poorly and desires to achieved higher levels of iron faster for adequate hematopoesis. Some of the side-effects to be expected including risks of infusion reactions, phlebitis, headaches, nausea and fatigue.  The patient is willing to proceed. Patient education material was dispensed.  Goal is to keep ferritin level greater than 100

## 2015-05-09 NOTE — Progress Notes (Signed)
Addendum at 2 PM. Serum iron and ferritin came back improved compared to prior visit. The patient has minimum symptoms of anemia with no fatigue. I called the patient and reviewed the test results. I recommend he continue his oral iron supplement for 2 more months and I will see him back in May as scheduled. I will cancel his iron infusion. If his blood work continued to stay low in May, I would begin ESA. He agreed with the plan of care.

## 2015-05-09 NOTE — Telephone Encounter (Signed)
Gave and printed appt sched and avs fo rpt for March and May

## 2015-05-09 NOTE — Progress Notes (Signed)
[facility] OFFICE PROGRESS NOTE  TAPPER,DAVID B, MD SUMMARY OF HEMATOLOGIC HISTORY:  Mike Roach is here because of progressive anemia. I have blood work sent from his 65 office for review.  The patient was oriented to have progressive anemia over the past few years. His blood work dated November 2014 was borderline low at hemoglobin of 11.8. He denies recent chest pain on exertion, shortness of breath on minimal exertion, pre-syncopal episodes, or palpitations. He had not noticed any recent bleeding such as epistaxis, hematuria or hematochezia The patient denies over the counter NSAID ingestion. He is on antiplatelets agents. His last colonoscopy was recently and only polyps is found. He has chronic GERD but never had upper endoscopy. He had no prior history or diagnosis of cancer. His age appropriate screening programs are up-to-date. He denies any pica and eats a variety of diet. He never donated blood or received blood transfusion He has chronic arthritis/joint pain.  The patient was recommended to take oral iron supplements  INTERVAL HISTORY: Mike Roach 75 y.o. male returns for further follow-up. He has seen GI and EGD is pending at the end of the month. He take 1 oral iron supplement per day and it causes gastritis The patient denies any recent signs or symptoms of bleeding such as spontaneous epistaxis, hematuria or hematochezia.   I have reviewed the past medical history, past surgical history, social history and family history with the patient and they are unchanged from previous note.  ALLERGIES:  has No Known Allergies.  MEDICATIONS:  Current Outpatient Prescriptions  Medication Sig Dispense Refill  . aspirin 81 MG tablet Take 81 mg by mouth daily.    . dorzolamide (TRUSOPT) 2 % ophthalmic solution Place 1 drop into both eyes 2 (two) times daily.     Marland Kitchen lisinopril (PRINIVIL,ZESTRIL) 40 MG tablet Take 20 mg by mouth daily.     . Multiple  Vitamins-Minerals (MULTIVITAMINS THER. W/MINERALS) TABS tablet Take 1 tablet by mouth daily.    . Omega-3 Fatty Acids (FISH OIL PO) Take 4 capsules by mouth daily.    Marland Kitchen omeprazole (PRILOSEC) 20 MG capsule Take 20 mg by mouth daily.    . simvastatin (ZOCOR) 20 MG tablet Take 20 mg by mouth daily.    . travoprost, benzalkonium, (TRAVATAN) 0.004 % ophthalmic solution Place 1 drop into both eyes at bedtime.      No current facility-administered medications for this visit.     REVIEW OF SYSTEMS:   Constitutional: Denies fevers, chills or night sweats Eyes: Denies blurriness of vision Ears, nose, mouth, throat, and face: Denies mucositis or sore throat Respiratory: Denies cough, dyspnea or wheezes Cardiovascular: Denies palpitation, chest discomfort or lower extremity swelling Gastrointestinal:  Denies nausea, heartburn or change in bowel habits Skin: Denies abnormal skin rashes Lymphatics: Denies new lymphadenopathy or easy bruising Neurological:Denies numbness, tingling or new weaknesses Behavioral/Psych: Mood is stable, no new changes  All other systems were reviewed with the patient and are negative.  PHYSICAL EXAMINATION: ECOG PERFORMANCE STATUS: 0 - Asymptomatic  Filed Vitals:   05/09/15 0929  BP: 115/58  Pulse: 78  Temp: 98.1 F (36.7 C)  Resp: 17   Filed Weights   05/09/15 0929  Weight: 203 lb 14.4 oz (92.488 kg)    GENERAL:alert, no distress and comfortable SKIN: skin color, texture, turgor are normal, no rashes or significant lesions EYES: normal, Conjunctiva are pink and non-injected, sclera clear Musculoskeletal:no cyanosis of digits and no clubbing  NEURO: alert & oriented x  3 with fluent speech, no focal motor/sensory deficits  LABORATORY DATA:  I have reviewed the data as listed Results for orders placed or performed in visit on 05/09/15 (from the past 48 hour(s))  CBC & Diff and Retic     Status: Abnormal   Collection Time: 05/09/15  9:19 AM  Result Value  Ref Range   WBC 5.3 4.0 - 10.3 10e3/uL   NEUT# 2.6 1.5 - 6.5 10e3/uL   HGB 9.6 (L) 13.0 - 17.1 g/dL   HCT 29.4 (L) 38.4 - 49.9 %   Platelets 293 140 - 400 10e3/uL   MCV 92.5 79.3 - 98.0 fL   MCH 30.2 27.2 - 33.4 pg   MCHC 32.7 32.0 - 36.0 g/dL   RBC 3.18 (L) 4.20 - 5.82 10e6/uL   RDW 16.3 (H) 11.0 - 14.6 %   lymph# 1.4 0.9 - 3.3 10e3/uL   MONO# 0.8 0.1 - 0.9 10e3/uL   Eosinophils Absolute 0.5 0.0 - 0.5 10e3/uL   Basophils Absolute 0.1 0.0 - 0.1 10e3/uL   NEUT% 48.3 39.0 - 75.0 %   LYMPH% 27.0 14.0 - 49.0 %   MONO% 14.5 (H) 0.0 - 14.0 %   EOS% 9.1 (H) 0.0 - 7.0 %   BASO% 1.1 0.0 - 2.0 %   Retic % 1.02 0.80 - 1.80 %   Retic Ct Abs 32.44 (L) 34.80 - 93.90 10e3/uL   Immature Retic Fract 16.30 (H) 3.00 - 10.60 %    Lab Results  Component Value Date   WBC 5.3 05/09/2015   HGB 9.6* 05/09/2015   HCT 29.4* 05/09/2015   MCV 92.5 05/09/2015   PLT 293 05/09/2015    ASSESSMENT & PLAN:  Iron deficiency anemia The patient continues to be anemic. I suspect he may have persistent iron deficiency anemia. Ferritin level is pending. I plan to give him 2 doses of IV Feraheme to correct the iron deficiency anemia. He has GI appointment pending. I plan to see him back in 2 months with repeat iron studies. 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 We discussed some of the risks, benefits, and alternatives of intravenous iron infusions. The patient is symptomatic from anemia and the iron level is critically low. He tolerated oral iron supplement poorly and desires to achieved higher levels of iron faster for adequate hematopoesis. Some of the side-effects to be expected including risks of infusion reactions, phlebitis, headaches, nausea and fatigue.  The patient is willing to proceed. Patient education material was dispensed.  Goal is to keep ferritin level greater than 100    All  questions were answered. The patient knows to call the clinic with any problems, questions or concerns. No barriers to learning was detected.  I spent 15 minutes counseling the patient face to face. The total time spent in the appointment was 20 minutes and more than 50% was on counseling.     [facility], Aubrei Bouchie, MD 3/8/201710:23 AM

## 2015-05-14 ENCOUNTER — Ambulatory Visit: Payer: Medicare HMO

## 2015-05-21 DIAGNOSIS — H401133 Primary open-angle glaucoma, bilateral, severe stage: Secondary | ICD-10-CM | POA: Diagnosis not present

## 2015-05-21 DIAGNOSIS — H40053 Ocular hypertension, bilateral: Secondary | ICD-10-CM | POA: Diagnosis not present

## 2015-05-23 ENCOUNTER — Ambulatory Visit: Payer: Medicare HMO

## 2015-05-31 ENCOUNTER — Encounter: Payer: Self-pay | Admitting: Internal Medicine

## 2015-05-31 ENCOUNTER — Ambulatory Visit (AMBULATORY_SURGERY_CENTER): Payer: Medicare HMO | Admitting: Internal Medicine

## 2015-05-31 VITALS — BP 104/56 | HR 46 | Temp 96.9°F | Resp 19 | Ht 74.0 in | Wt 209.0 lb

## 2015-05-31 DIAGNOSIS — Q394 Esophageal web: Secondary | ICD-10-CM | POA: Diagnosis not present

## 2015-05-31 DIAGNOSIS — K222 Esophageal obstruction: Secondary | ICD-10-CM

## 2015-05-31 DIAGNOSIS — K31819 Angiodysplasia of stomach and duodenum without bleeding: Secondary | ICD-10-CM

## 2015-05-31 DIAGNOSIS — K3189 Other diseases of stomach and duodenum: Secondary | ICD-10-CM | POA: Diagnosis not present

## 2015-05-31 DIAGNOSIS — D509 Iron deficiency anemia, unspecified: Secondary | ICD-10-CM | POA: Diagnosis not present

## 2015-05-31 DIAGNOSIS — D649 Anemia, unspecified: Secondary | ICD-10-CM | POA: Diagnosis not present

## 2015-05-31 MED ORDER — SODIUM CHLORIDE 0.9 % IV SOLN: 500.0000 mL | INTRAVENOUS | Status: DC

## 2015-05-31 NOTE — Op Note (Signed)
[facility] Patient Name: Mike Roach Procedure Date: 05/31/2015 7:02 AM MRN: [id number] Endoscopist: [name] , MD Age: 75 Referring MD:  Date of Birth: 22-Jul-1940 Gender: Male Procedure:                Upper GI endoscopy Indications:              Iron deficiency anemia with no gastrointestinal                            bleeding source identified during previous                            colonoscopy Medicines:                Propofol total dose 200 mg IV, Monitored Anesthesia                            Care Procedure:                Pre-Anesthesia Assessment:                           - Prior to the procedure, a History and Physical                            was performed, and patient medications and                            allergies were reviewed. The patient's tolerance of                            previous anesthesia was also reviewed. The risks                            and benefits of the procedure and the sedation                            options and risks were discussed with the patient.                            All questions were answered, and informed consent                            was obtained. Prior Anticoagulants: The patient has                            taken no previous anticoagulant or antiplatelet                            agents. ASA Grade Assessment: II - A patient with                            mild systemic disease. After reviewing the risks  and benefits, the patient was deemed in                            satisfactory condition to undergo the procedure.                           After obtaining informed consent, the endoscope was                            passed under direct vision. Throughout the                            procedure, the patient's blood pressure, pulse, and                            oxygen saturations were monitored continuously. The                            Model GIF-[id number]  [id number]) scope was introduced                            through the mouth, and advanced to the second part                            of duodenum. The upper GI endoscopy was                            accomplished without difficulty. The patient                            tolerated the procedure well. Scope In: Scope Out: Findings:      Two (one small and one large mass-like) sessile polyps with no bleeding       were found in the second portion of the duodenum. Biopsies were taken       with a cold forceps for histology. Verification of patient       identification for the specimen was done. Estimated blood loss was       minimal.      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      A non-obstructing Schatzki ring (acquired) was found at the       gastroesophageal junction.      A small hiatal hernia was present.      The exam was otherwise without abnormality.      The cardia and gastric fundus were normal on retroflexion. Complications:            No immediate complications. Estimated Blood Loss:     Estimated blood loss: none. Impression:               - Two duodenal polyps. Biopsied.                           - A single non-bleeding angiodysplastic lesion in  the duodenum.                           - Non-obstructing Schatzki ring.                           - Small hiatal hernia.                           - The examination was otherwise normal. Recommendation:           - Patient has a contact number available for                            emergencies. The signs and symptoms of potential                            delayed complications were discussed with the                            patient. Return to normal activities tomorrow.                            Written discharge instructions were provided to the                            patient.                           - Resume previous diet.                            - Continue present medications.                           - Await pathology results.                           - Will probably need an EUS or CT or both to                            evaluate large polypoid mass like lesion in                            duodenum. Procedure Code(s):        --- Professional ---                           [id number], Esophagogastroduodenoscopy, flexible,                            transoral; with biopsy, single or multiple CPT copyright 2016 [employer]. All rights reserved. [name], MD 05/31/2015 8:09:08 AM This report has been signed electronically. Number of Addenda: 0 CC Letter to:             Ella Jubilee. Scotty Court, MD Referring MD:      Zella Richer. Scotty Court

## 2015-05-31 NOTE — Progress Notes (Signed)
Called to room to assist during endoscopic procedure.  Patient ID and intended procedure confirmed with present staff. Received instructions for my participation in the procedure from the performing physician.  

## 2015-05-31 NOTE — Patient Instructions (Addendum)
I saw a few things.  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 Blood vessels on surface that leak blood. Can cauterize if we need to.  There were 2 polyps in the duodenum - one large - ? Some sort of tumor. i took biopsies but anticipate additional testing with xrays or an endoscopic ultrasound.  Also have a hiatal hernia and a schatzki ring - not problems.  I appreciate the opportunity to care for you. [name], MD, FACG   YOU HAD AN ENDOSCOPIC PROCEDURE TODAY AT [facility]:   Refer to the procedure report that was given to you for any specific questions about what was found during the examination.  If the procedure report does not answer your questions, please call your gastroenterologist to clarify.  If you requested that your care partner not be given the details of your procedure findings, then the procedure report has been included in a sealed envelope for you to review at your convenience later.  YOU SHOULD EXPECT: Some feelings of bloating in the abdomen. Passage of more gas than usual.  Walking can help get rid of the air that was put into your GI tract during the procedure and reduce the bloating. If you had a lower endoscopy (such as a colonoscopy or flexible sigmoidoscopy) you may notice spotting of blood in your stool or on the toilet paper. If you underwent a bowel prep for your procedure, you may not have a normal bowel movement for a few days.  Please Note:  You might notice some irritation and congestion in your nose or some drainage.  This is from the oxygen used during your procedure.  There is no need for concern and it should clear up in a day or so.  SYMPTOMS TO REPORT IMMEDIATELY:   Following upper endoscopy (EGD)  Vomiting of blood or coffee ground material  New chest pain or pain under the shoulder blades  Painful or persistently difficult swallowing  New shortness of breath  Fever of 100F or  higher  Black, tarry-looking stools  For urgent or emergent issues, a gastroenterologist can be reached at any hour by calling [phone].   DIET: Your first meal following the procedure should be a small meal and then it is ok to progress to your normal diet. Heavy or fried foods are harder to digest and may make you feel nauseous or bloated.  Likewise, meals heavy in dairy and vegetables can increase bloating.  Drink plenty of fluids but you should avoid alcoholic beverages for 24 hours.  ACTIVITY:  You should plan to take it easy for the rest of today and you should NOT DRIVE or use heavy machinery until tomorrow (because of the sedation medicines used during the test).    FOLLOW UP: Our staff will call the number listed on your records the next business day following your procedure to check on you and address any questions or concerns that you may have regarding the information given to you following your procedure. If we do not reach you, we will leave a message.  However, if you are feeling well and you are not experiencing any problems, there is no need to return our call.  We will assume that you have returned to your regular daily activities without incident.  If any biopsies were taken you will be contacted by phone or by letter within the next 1-3 weeks.  Please call us at [phone] if you have not heard about the biopsies in 3 weeks.    SIGNATURES/CONFIDENTIALITY: You and/or your care partner have signed paperwork which will be entered into your electronic medical record.  These signatures attest to the fact that that the information above on your After Visit Summary has been reviewed and is understood.  Full responsibility of the confidentiality of this discharge information lies with you and/or your care-partner.

## 2015-05-31 NOTE — Progress Notes (Signed)
A and O Report to RN 

## 2015-06-01 ENCOUNTER — Telehealth: Payer: Self-pay

## 2015-06-01 NOTE — Telephone Encounter (Signed)
  Follow up Call-  Call back number 05/31/2015  Post procedure Call Back phone  # [phone]  Permission to leave phone message Yes    Patient was called for follow up after procedure on 05/31/2015. No answer at the number given for follow up phone call. A message was left on the answering machine.

## 2015-06-04 DIAGNOSIS — H903 Sensorineural hearing loss, bilateral: Secondary | ICD-10-CM | POA: Diagnosis not present

## 2015-06-07 NOTE — Progress Notes (Signed)
Quick Note:  Please let him know the biopsies were normal  I am not sure what the bulge - ? Mass in the duodenum is - am going to discuss with colleague re: next step to see what may be there and we will call him   [facility] - no recall or letter ______

## 2015-06-12 NOTE — Progress Notes (Signed)
Quick Note:  Reviewed with Dr. Ardis Hughs - needs CT abdomen with contrast re: submucosal duodenal mass seen on EGD (should not need pelvic CT) Please order Do BUN/creat if needed ______

## 2015-06-13 ENCOUNTER — Other Ambulatory Visit: Payer: Self-pay

## 2015-06-13 DIAGNOSIS — Q438 Other specified congenital malformations of intestine: Secondary | ICD-10-CM

## 2015-06-14 ENCOUNTER — Other Ambulatory Visit (INDEPENDENT_AMBULATORY_CARE_PROVIDER_SITE_OTHER): Payer: Medicare HMO

## 2015-06-14 DIAGNOSIS — Q438 Other specified congenital malformations of intestine: Secondary | ICD-10-CM | POA: Diagnosis not present

## 2015-06-14 LAB — BUN: BUN: 22 mg/dL (ref 6–23)

## 2015-06-14 LAB — CREATININE, SERUM: Creatinine, Ser: 1.54 mg/dL — ABNORMAL HIGH (ref 0.40–1.50)

## 2015-06-20 ENCOUNTER — Ambulatory Visit (INDEPENDENT_AMBULATORY_CARE_PROVIDER_SITE_OTHER)
Admission: RE | Admit: 2015-06-20 | Discharge: 2015-06-20 | Disposition: A | Discharge status: Home / Self Care | Payer: Medicare HMO | Source: Ambulatory Visit | Attending: Internal Medicine | Admitting: Internal Medicine

## 2015-06-20 DIAGNOSIS — K3189 Other diseases of stomach and duodenum: Secondary | ICD-10-CM | POA: Diagnosis not present

## 2015-06-20 DIAGNOSIS — Q438 Other specified congenital malformations of intestine: Secondary | ICD-10-CM | POA: Diagnosis not present

## 2015-06-20 MED ORDER — IOPAMIDOL (ISOVUE-300) INJECTION 61%
100.0000 mL | Freq: Once | INTRAVENOUS | Status: AC | PRN
  Administered 2015-06-20: 100 mL via INTRAVENOUS

## 2015-06-21 NOTE — Progress Notes (Signed)
Quick Note:  CT shows the mass I saw also - not clear what it is Could be a tumor  Also has masses in kidneys    Needs renal mass protocol MRI abdomen with and without contrast please EUS likely next to evaluate duodenal lesion/mass- Dr. Ardis Hughs cced - he wanted CT first ______

## 2015-06-22 ENCOUNTER — Other Ambulatory Visit: Payer: Self-pay

## 2015-06-22 DIAGNOSIS — R935 Abnormal findings on diagnostic imaging of other abdominal regions, including retroperitoneum: Secondary | ICD-10-CM

## 2015-06-22 DIAGNOSIS — N2889 Other specified disorders of kidney and ureter: Secondary | ICD-10-CM

## 2015-06-25 ENCOUNTER — Telehealth: Payer: Self-pay | Admitting: Gastroenterology

## 2015-06-25 ENCOUNTER — Other Ambulatory Visit: Payer: Self-pay

## 2015-06-25 DIAGNOSIS — D3A00 Benign carcinoid tumor of unspecified site: Secondary | ICD-10-CM

## 2015-06-25 NOTE — Telephone Encounter (Signed)
Glendell Docker,     Thanks. I looked at the CT and re-reviewed your EGD images. 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 I agree EUS is best next step. We'll get in touch with him to set it up.        Matelyn Antonelli,    He needs upper EUS, radial+/- linear, next available EUS Thursday with MAC. thanks

## 2015-06-25 NOTE — Telephone Encounter (Signed)
Left message on machine to call back  

## 2015-06-27 NOTE — Telephone Encounter (Signed)
EUS scheduled, pt instructed and medications reviewed.  Patient instructions mailed to home.  Patient to call with any questions or concerns.    Procedure Date: 07/05/15 Arrival Time: 1230 pm Procedure Time: 2 pm

## 2015-06-28 ENCOUNTER — Encounter (HOSPITAL_COMMUNITY): Payer: Self-pay | Admitting: *Deleted

## 2015-06-29 ENCOUNTER — Ambulatory Visit (HOSPITAL_COMMUNITY)
Admission: RE | Admit: 2015-06-29 | Discharge: 2015-06-29 | Disposition: A | Discharge status: Home / Self Care | Payer: Medicare HMO | Source: Ambulatory Visit | Attending: Internal Medicine | Admitting: Internal Medicine

## 2015-06-29 DIAGNOSIS — N2889 Other specified disorders of kidney and ureter: Secondary | ICD-10-CM | POA: Insufficient documentation

## 2015-06-29 DIAGNOSIS — K639 Disease of intestine, unspecified: Secondary | ICD-10-CM | POA: Diagnosis not present

## 2015-06-29 DIAGNOSIS — N281 Cyst of kidney, acquired: Secondary | ICD-10-CM | POA: Insufficient documentation

## 2015-06-29 MED ORDER — GADOBENATE DIMEGLUMINE 529 MG/ML IV SOLN
20.0000 mL | Freq: Once | INTRAVENOUS | Status: AC | PRN
  Administered 2015-06-29: 20 mL via INTRAVENOUS

## 2015-06-29 NOTE — Progress Notes (Signed)
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 Please make a referral to [facility]   ______

## 2015-07-05 ENCOUNTER — Ambulatory Visit (HOSPITAL_COMMUNITY): Payer: Medicare HMO | Admitting: Anesthesiology

## 2015-07-05 ENCOUNTER — Encounter (HOSPITAL_COMMUNITY)
Admission: RE | Disposition: A | Discharge status: Home / Self Care | Payer: Self-pay | Source: Ambulatory Visit | Attending: Gastroenterology

## 2015-07-05 ENCOUNTER — Encounter (HOSPITAL_COMMUNITY): Payer: Self-pay

## 2015-07-05 ENCOUNTER — Ambulatory Visit (HOSPITAL_COMMUNITY)
Admission: RE | Admit: 2015-07-05 | Discharge: 2015-07-05 | Disposition: A | Discharge status: Home / Self Care | Payer: Medicare HMO | Source: Ambulatory Visit | Attending: Gastroenterology | Admitting: Gastroenterology

## 2015-07-05 DIAGNOSIS — M1990 Unspecified osteoarthritis, unspecified site: Secondary | ICD-10-CM | POA: Diagnosis not present

## 2015-07-05 DIAGNOSIS — I129 Hypertensive chronic kidney disease with stage 1 through stage 4 chronic kidney disease, or unspecified chronic kidney disease: Secondary | ICD-10-CM | POA: Diagnosis not present

## 2015-07-05 DIAGNOSIS — Z87891 Personal history of nicotine dependence: Secondary | ICD-10-CM | POA: Insufficient documentation

## 2015-07-05 DIAGNOSIS — R933 Abnormal findings on diagnostic imaging of other parts of digestive tract: Secondary | ICD-10-CM | POA: Diagnosis not present

## 2015-07-05 DIAGNOSIS — D490 Neoplasm of unspecified behavior of digestive system: Secondary | ICD-10-CM | POA: Diagnosis not present

## 2015-07-05 DIAGNOSIS — Z86010 Personal history of colonic polyps: Secondary | ICD-10-CM | POA: Insufficient documentation

## 2015-07-05 DIAGNOSIS — R935 Abnormal findings on diagnostic imaging of other abdominal regions, including retroperitoneum: Secondary | ICD-10-CM

## 2015-07-05 DIAGNOSIS — C49A3 Gastrointestinal stromal tumor of small intestine: Secondary | ICD-10-CM | POA: Insufficient documentation

## 2015-07-05 DIAGNOSIS — D3A00 Benign carcinoid tumor of unspecified site: Secondary | ICD-10-CM

## 2015-07-05 DIAGNOSIS — N189 Chronic kidney disease, unspecified: Secondary | ICD-10-CM | POA: Diagnosis not present

## 2015-07-05 DIAGNOSIS — K3189 Other diseases of stomach and duodenum: Secondary | ICD-10-CM | POA: Diagnosis not present

## 2015-07-05 DIAGNOSIS — K219 Gastro-esophageal reflux disease without esophagitis: Secondary | ICD-10-CM | POA: Diagnosis not present

## 2015-07-05 HISTORY — PX: EUS: SHX5427

## 2015-07-05 HISTORY — DX: Benign prostatic hyperplasia without lower urinary tract symptoms: N40.0

## 2015-07-05 SURGERY — UPPER ENDOSCOPIC ULTRASOUND (EUS) LINEAR
Anesthesia: Monitor Anesthesia Care

## 2015-07-05 MED ORDER — PHENYLEPHRINE HCL 10 MG/ML IJ SOLN
INTRAMUSCULAR | Status: DC | PRN
  Administered 2015-07-05: 80 ug via INTRAVENOUS

## 2015-07-05 MED ORDER — PROPOFOL 10 MG/ML IV BOLUS
INTRAVENOUS | Status: AC
  Filled 2015-07-05: qty 20

## 2015-07-05 MED ORDER — PHENYLEPHRINE 40 MCG/ML (10ML) SYRINGE FOR IV PUSH (FOR BLOOD PRESSURE SUPPORT)
PREFILLED_SYRINGE | INTRAVENOUS | Status: AC
  Filled 2015-07-05: qty 10

## 2015-07-05 MED ORDER — CIPROFLOXACIN IN D5W 400 MG/200ML IV SOLN
INTRAVENOUS | Status: AC
  Filled 2015-07-05: qty 200

## 2015-07-05 MED ORDER — LACTATED RINGERS IV SOLN
INTRAVENOUS | Status: DC
  Administered 2015-07-05: 1000 mL via INTRAVENOUS

## 2015-07-05 MED ORDER — SODIUM CHLORIDE 0.9 % IV SOLN: INTRAVENOUS | Status: DC

## 2015-07-05 MED ORDER — PROPOFOL 500 MG/50ML IV EMUL
INTRAVENOUS | Status: DC | PRN
  Administered 2015-07-05: 100 ug/kg/min via INTRAVENOUS

## 2015-07-05 MED ORDER — PROPOFOL 10 MG/ML IV BOLUS
INTRAVENOUS | Status: DC | PRN
  Administered 2015-07-05: 20 mg via INTRAVENOUS
  Administered 2015-07-05: 60 mg via INTRAVENOUS
  Administered 2015-07-05 (×2): 20 mg via INTRAVENOUS

## 2015-07-05 NOTE — Discharge Instructions (Signed)

## 2015-07-05 NOTE — Anesthesia Postprocedure Evaluation (Signed)
Anesthesia Post Note  Patient: Mike Roach  Procedure(s) Performed: Procedure(s) (LRB): UPPER ENDOSCOPIC ULTRASOUND (EUS) LINEAR (N/A)  Patient location during evaluation: PACU Anesthesia Type: General Level of consciousness: awake and alert Pain management: pain level controlled Vital Signs Assessment: post-procedure vital signs reviewed and stable Respiratory status: spontaneous breathing, nonlabored ventilation, respiratory function stable and patient connected to nasal cannula oxygen Cardiovascular status: blood pressure returned to baseline and stable Postop Assessment: no signs of nausea or vomiting Anesthetic complications: no    Last Vitals:  Filed Vitals:   07/05/15 1341 07/05/15 1350  BP: 170/122 101/54  Pulse: 62 54  Temp: 36.3 C   Resp: 15 16    Last Pain: There were no vitals filed for this visit.               Zenaida Deed

## 2015-07-05 NOTE — Anesthesia Preprocedure Evaluation (Signed)
Anesthesia Evaluation  Patient identified by MRN, date of birth, ID band Patient awake    Reviewed: Allergy & Precautions, H&P , NPO status , Patient's Chart, lab work & pertinent test results  History of Anesthesia Complications Negative for: history of anesthetic complications  Airway Mallampati: II  TM Distance: >3 FB     Dental  (+) Teeth Intact   Pulmonary former smoker,    breath sounds clear to auscultation       Cardiovascular hypertension, Pt. on medications  Rhythm:Regular Rate:Normal     Neuro/Psych    GI/Hepatic neg GERD (resolved)  ,  Endo/Other    Renal/GU CRFRenal disease     Musculoskeletal  (+) Arthritis ,   Abdominal   Peds  Hematology   Anesthesia Other Findings glaucoma  Reproductive/Obstetrics                             Anesthesia Physical  Anesthesia Plan  ASA: II  Anesthesia Plan: MAC   Post-op Pain Management:    Induction: Intravenous  Airway Management Planned: Nasal Cannula  Additional Equipment:   Intra-op Plan:   Post-operative Plan: Extubation in OR  Informed Consent: I have reviewed the patients History and Physical, chart, labs and discussed the procedure including the risks, benefits and alternatives for the proposed anesthesia with the patient or authorized representative who has indicated his/her understanding and acceptance.     Plan Discussed with:   Anesthesia Plan Comments:         Anesthesia Quick Evaluation

## 2015-07-05 NOTE — Op Note (Signed)
[facility] Patient Name: Mike Roach Procedure Date: 07/05/2015 MRN: [id number] Attending MD: Milus Banister , MD Date of Birth: [birth year] CSN: [id number] Age: 75 Admit Type: Outpatient Procedure:                Upper EUS Indications:              Duodenal mucosal mass/polyp found on endoscopy,                            this was also visible by CT and MRI; incidental                            renal mass also noted during this workup Providers:                Milus Banister, MD, Elspeth Cho, Technician,                            Carolynn Comment, RN Referring MD:             Silvano Rusk, MD Medicines:                Monitored Anesthesia Care Complications:            No immediate complications. Estimated blood loss:                            None. Estimated Blood Loss:     Estimated blood loss: none. Procedure:                Pre-Anesthesia Assessment:                           - Prior to the procedure, a History and Physical                            was performed, and patient medications and                            allergies were reviewed. The patient's tolerance of                            previous anesthesia was also reviewed. The risks                            and benefits of the procedure and the sedation                            options and risks were discussed with the patient.                            All questions were answered, and informed consent                            was obtained. Prior Anticoagulants: The patient has  taken no previous anticoagulant or antiplatelet                            agents. ASA Grade Assessment: II - A patient with                            mild systemic disease. After reviewing the risks                            and benefits, the patient was deemed in                            satisfactory condition to undergo the procedure.                           After obtaining  informed consent, the endoscope was                            passed under direct vision. Throughout the                            procedure, the patient's blood pressure, pulse, and                            oxygen saturations were monitored continuously. The                            [id number]) scope was introduced through                            the mouth, and advanced to the second part of                            duodenum. The [id number]) scope was                            introduced through the mouth, and advanced to the                            second part of duodenum. The [id number])                            scope was introduced through the mouth, and                            advanced to the second part of duodenum. The upper                            EUS was accomplished without difficulty. The                            patient tolerated the procedure well. Scope In: Scope Out: Findings:      Endoscopic Finding :  1. A medium-sized submucosal mass with no bleeding was found in the       second portion of the duodenum. This was clearly located on the wall       opposite the major papilla, at that same level.      2. UGI tract was otherwise normal.      Endosonographic Finding :      1. The duodenal mass described above correlated with a hypoechoic oval       mass that measured 26 mm in maximal cross-sectional diameter. The mass       clearly communicated with the muscularis propria layer of the duodenal       wall. The endosonographic borders were well-defined. Fine needle       aspiration for cytology was performed. Color Doppler imaging was       utilized prior to needle puncture to confirm a lack of significant       vascular structures within the needle path. Two passes were made with       the 22 gauge needle. No stylet was used. A preliminary cytologic       examination was not performed. Final cytology results are  pending.      2. No periportal adenopathy.      3. Limited views of CBD, pancreas, liver, spleen, portal vessels were       all normal. Impression:               - 2.6cm submucosal duodenal mass that is located at                            the level of the major papilla, on the wall                            opposite the major papilla. The major papilla is                            NOT involved with this mass. The mass clearly                            communicates with the muscularis propria layer and                            is most suspicious for small bowel GIST. FNA                            performed. Await final cytology. Moderate Sedation:      N/A- Per Anesthesia Care Recommendation:           - Discharge patient to home (ambulatory).                           - Await final cytology.                           - Please continue with your upcoming visit with                            Urology (evaluating the newly diagnosed renal  mass). It seems unilely that the renal mass and the                            duodenal mass are related. Procedure Code(s):        --- Professional ---                           [id number], Esophagogastroduodenoscopy, flexible,                            transoral; with transendoscopic ultrasound-guided                            intramural or transmural fine needle                            aspiration/biopsy(s), (includes endoscopic                            ultrasound examination limited to the esophagus,                            stomach or duodenum, and adjacent structures) Diagnosis Code(s):        --- Professional ---                           K31.89, Other diseases of stomach and duodenum                           R93.3, Abnormal findings on diagnostic imaging of                            other parts of digestive tract CPT copyright 2016 [employer]. All rights reserved. The codes documented in  this report are preliminary and upon coder review may  be revised to meet current compliance requirements. Milus Banister, MD 07/05/2015 1:45:02 PM This report has been signed electronically. Number of Addenda: 0

## 2015-07-05 NOTE — H&P (Signed)
  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 Glaucoma   . GERD (gastroesophageal reflux disease)   . Hypercholesterolemia   . Hypertension   . Arthritis   . Chronic kidney disease   . Skin lesion of left leg 03/07/2015  . Chronic kidney disease (CKD) 03/07/2015  . Bilateral hearing loss 03/15/2015  . Adenomatous colon polyp 09/21/2014  . Prostate enlargement   . Anemia in chronic illness 03/07/2015    being evaluated  . Iron deficiency anemia 03/15/2015    Past Surgical History  Procedure Laterality Date  . Orif ankle dislocation Right   . Knee arthroscopy with lateral menisectomy Right 01/21/2013    Procedure: KNEE ARTHROSCOPY WITH PARTIAL LATERAL MENISECTOMY;  Surgeon: Carole Civil, MD;  Location: [facility];  Service: Orthopedics;  Laterality: Right;  . Colonoscopy      Current Facility-Administered Medications  Medication Dose Route Frequency Provider Last Rate Last Dose  . 0.9 %  sodium chloride infusion   Intravenous Continuous Milus Banister, MD      . 0.9 %  sodium chloride infusion   Intravenous Continuous Milus Banister, MD      . lactated ringers infusion   Intravenous Continuous Milus Banister, MD        Allergies as of 06/22/2015  . (No Known Allergies)    History reviewed. No pertinent family history.  Social History   Social History  . Marital Status: Divorced    Spouse Name: N/A  . Number of Children: N/A  . Years of Education: N/A   Occupational History  . Not on file.   Social History Main Topics  . Smoking status: Former Smoker -- 1.00 packs/day for 20 years    Types: Cigarettes    Quit date: 01/19/1981  . Smokeless tobacco: Never Used  . Alcohol Use: No  . Drug Use: No  . 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 Not  on file   Social History Narrative     Physical Exam: There were no vitals taken for this visit. Constitutional: generally well-appearing Psychiatric: alert and oriented x3 Abdomen: soft, nontender, nondistended, no obvious ascites, no peritoneal signs, normal bowel sounds   Assessment and plan: 75 y.o. male with periampullary, duodenal mass   for EUS today   Owens Loffler, MD [facility] 07/05/2015, 12:41 PM

## 2015-07-05 NOTE — Transfer of Care (Signed)
Immediate Anesthesia Transfer of Care Note  Patient: Mike Roach  Procedure(s) Performed: Procedure(s): UPPER ENDOSCOPIC ULTRASOUND (EUS) LINEAR (N/A)  Patient Location: PACU  Anesthesia Type:MAC  Level of Consciousness:  sedated, patient cooperative and responds to stimulation  Airway & Oxygen Therapy:Patient Spontanous Breathing and Patient connected to face mask oxgen  Post-op Assessment:  Report given to PACU RN and Post -op Vital signs reviewed and stable  Post vital signs:  Reviewed and stable  Last Vitals:  Filed Vitals:   07/05/15 1244  Pulse: 61  Temp: 36.6 C  Resp: 14    Complications: No apparent anesthesia complications

## 2015-07-08 ENCOUNTER — Encounter (HOSPITAL_COMMUNITY): Payer: Self-pay | Admitting: Gastroenterology

## 2015-07-09 ENCOUNTER — Telehealth: Payer: Self-pay | Admitting: *Deleted

## 2015-07-09 NOTE — Telephone Encounter (Signed)
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  I am scheduled to see Dr. Diona Fanti on Jul 24, 2015.  I don't think I should wait that long to find out what's going on.  Comern­o made this referral but I want Dr. Alvy Bimler advice.  I will talk with her about this during the F/U on 07-12-2015."

## 2015-07-10 ENCOUNTER — Telehealth: Payer: Self-pay | Admitting: Gastroenterology

## 2015-07-10 NOTE — Progress Notes (Signed)
Quick Note:  Agree w/ review at cancer conference - ______

## 2015-07-10 NOTE — Telephone Encounter (Signed)
Did you call this patient?

## 2015-07-10 NOTE — Telephone Encounter (Signed)
See result note from today 

## 2015-07-12 ENCOUNTER — Ambulatory Visit (HOSPITAL_BASED_OUTPATIENT_CLINIC_OR_DEPARTMENT_OTHER): Payer: Medicare HMO | Admitting: Hematology and Oncology

## 2015-07-12 ENCOUNTER — Encounter: Payer: Self-pay | Admitting: Hematology and Oncology

## 2015-07-12 ENCOUNTER — Other Ambulatory Visit (HOSPITAL_BASED_OUTPATIENT_CLINIC_OR_DEPARTMENT_OTHER): Payer: Medicare HMO

## 2015-07-12 VITALS — BP 127/66 | HR 54 | Temp 98.1°F | Resp 18 | Ht 73.0 in | Wt 207.2 lb

## 2015-07-12 DIAGNOSIS — N183 Chronic kidney disease, stage 3 unspecified: Secondary | ICD-10-CM

## 2015-07-12 DIAGNOSIS — C49A3 Gastrointestinal stromal tumor of small intestine: Secondary | ICD-10-CM | POA: Insufficient documentation

## 2015-07-12 DIAGNOSIS — D638 Anemia in other chronic diseases classified elsewhere: Secondary | ICD-10-CM | POA: Diagnosis not present

## 2015-07-12 DIAGNOSIS — N289 Disorder of kidney and ureter, unspecified: Secondary | ICD-10-CM

## 2015-07-12 DIAGNOSIS — D509 Iron deficiency anemia, unspecified: Secondary | ICD-10-CM

## 2015-07-12 HISTORY — DX: Disorder of kidney and ureter, unspecified: N28.9

## 2015-07-12 LAB — CBC & DIFF AND RETIC
BASO%: 0.5 % (ref 0.0–2.0)
BASOS ABS: 0 10*3/uL (ref 0.0–0.1)
EOS ABS: 0.3 10*3/uL (ref 0.0–0.5)
EOS%: 5.3 % (ref 0.0–7.0)
HEMATOCRIT: 33.6 % — AB (ref 38.4–49.9)
HEMOGLOBIN: 11 g/dL — AB (ref 13.0–17.1)
Immature Retic Fract: 22.4 % — ABNORMAL HIGH (ref 3.00–10.60)
LYMPH#: 1.6 10*3/uL (ref 0.9–3.3)
LYMPH%: 28.4 % (ref 14.0–49.0)
MCH: 31.9 pg (ref 27.2–33.4)
MCHC: 32.7 g/dL (ref 32.0–36.0)
MCV: 97.4 fL (ref 79.3–98.0)
MONO#: 0.7 10*3/uL (ref 0.1–0.9)
MONO%: 11.6 % (ref 0.0–14.0)
NEUT#: 3.1 10*3/uL (ref 1.5–6.5)
NEUT%: 54.2 % (ref 39.0–75.0)
Platelets: 256 10*3/uL (ref 140–400)
RBC: 3.45 10*6/uL — ABNORMAL LOW (ref 4.20–5.82)
RDW: 17 % — AB (ref 11.0–14.6)
RETIC %: 1.67 % (ref 0.80–1.80)
RETIC CT ABS: 57.62 10*3/uL (ref 34.80–93.90)
WBC: 5.7 10*3/uL (ref 4.0–10.3)

## 2015-07-12 LAB — BASIC METABOLIC PANEL
ANION GAP: 7 meq/L (ref 3–11)
BUN: 26.3 mg/dL — AB (ref 7.0–26.0)
CALCIUM: 9.5 mg/dL (ref 8.4–10.4)
CO2: 22 mEq/L (ref 22–29)
CREATININE: 1.9 mg/dL — AB (ref 0.7–1.3)
Chloride: 111 mEq/L — ABNORMAL HIGH (ref 98–109)
EGFR: 39 mL/min/{1.73_m2} — ABNORMAL LOW (ref >90)
GLUCOSE: 98 mg/dL (ref 70–140)
POTASSIUM: 4.3 meq/L (ref 3.5–5.1)
Sodium: 140 mEq/L (ref 136–145)

## 2015-07-12 LAB — IRON AND TIBC
%SAT: 42 % (ref 20–55)
IRON: 136 ug/dL (ref 42–163)
TIBC: 323 ug/dL (ref 202–409)
UIBC: 187 ug/dL (ref 117–376)

## 2015-07-12 LAB — FERRITIN: Ferritin: 26 ng/ml (ref 22–316)

## 2015-07-12 NOTE — Assessment & Plan Note (Signed)
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 He will continue medical management.

## 2015-07-12 NOTE — Assessment & Plan Note (Signed)
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 He may benefit from ESA in the future.

## 2015-07-12 NOTE — Progress Notes (Signed)
[place] OFFICE PROGRESS NOTE  Patient Care Team: Zella Richer. 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    06/29/2015 Imaging MRI abdomen showed 1.9 cm enhancing lesion along the lateral left lower kidney, corresponding to the CT abnormality, suspicious for solid renal neoplasm such as renal cell carcinoma   07/05/2015 Procedure EUS confirmed 2.6 cm mass opposite opening of the ampulla   07/05/2015 Pathology Results Accession: [id number] FNA was positive for GIST    INTERVAL HISTORY: Please see below for problem oriented charting. The patient returns today to follow-up on anemia. He had extensive evaluation done recently which show he had drawn renal lesion and kidney lesion. Recent biopsy confirmed GIST He feels well. He has excellent energy level. The patient denies any recent signs or symptoms of bleeding such as spontaneous epistaxis, hematuria or hematochezia. He denies abdominal discomfort. No recent flank pain.  REVIEW OF SYSTEMS:   Constitutional: Denies fevers, chills or abnormal weight loss Eyes: Denies blurriness of vision Ears, nose, mouth, throat, and face: Denies mucositis or sore throat Respiratory: Denies cough, dyspnea or wheezes Cardiovascular: Denies palpitation, chest discomfort or lower extremity swelling Gastrointestinal:  Denies nausea, heartburn or change in bowel  habits Skin: Denies abnormal skin rashes Lymphatics: Denies new lymphadenopathy or easy bruising Neurological:Denies numbness, tingling or new weaknesses Behavioral/Psych: Mood is stable, no new changes  All other systems were reviewed with the patient and are negative.  I have reviewed the past medical history, past surgical history, social history and family history with the patient and they are unchanged from previous note.  ALLERGIES:  has No Known Allergies.  MEDICATIONS:  Current Outpatient Prescriptions  Medication Sig Dispense Refill  . ARTIFICIAL TEAR OINTMENT OP Place 1 application into both eyes daily as needed (For dry eyes.).    Marland Kitchen aspirin EC 81 MG tablet Take 81 mg by mouth daily.    . dorzolamide (TRUSOPT) 2 % ophthalmic solution Place 1 drop into both eyes 2 (two) times daily.     . finasteride (PROSCAR) 5 MG tablet Take 5 mg by mouth daily.  3  . lisinopril (PRINIVIL,ZESTRIL) 20 MG tablet Take 20 mg by mouth daily.    . Multiple Vitamins-Minerals (MULTIVITAMINS THER. W/MINERALS) TABS tablet Take 1 tablet by mouth daily.    . Omega-3 Fatty Acids (FISH OIL PO) Take 4 capsules by mouth daily.    Marland Kitchen omeprazole (PRILOSEC) 20 MG capsule Take 20 mg by mouth daily.    . simvastatin (ZOCOR) 20 MG tablet Take 20 mg by mouth daily.    . travoprost, benzalkonium, (TRAVATAN) 0.004 % ophthalmic solution Place 1 drop into both eyes at bedtime.      No current facility-administered medications for this visit.    PHYSICAL EXAMINATION: ECOG PERFORMANCE STATUS: 0 - Asymptomatic  Filed Vitals:   07/12/15 0829  BP: 127/66  Pulse: 54  Temp: 98.1 F (36.7 C)  Resp: 18   Filed Weights   07/12/15  [id number]  Weight: 207 lb 3.2 oz (93.985 kg)    GENERAL:alert, no distress and comfortable SKIN: skin color, texture, turgor are normal, no rashes or significant lesions EYES: normal, Conjunctiva are pink and non-injected, sclera clear  Musculoskeletal:no cyanosis of digits and no clubbing   NEURO: alert & oriented x 3 with fluent speech, no focal motor/sensory deficits  LABORATORY DATA:  I have reviewed the data as listed    Component Value Date/Time   NA 140 07/12/2015 0809   NA 139 01/19/2013 1430   K 4.3 07/12/2015 0809   K 4.6 01/19/2013 1430   CL 106 01/19/2013 1430   CO2 22 07/12/2015 0809   CO2 25 01/19/2013 1430   GLUCOSE 98 07/12/2015 0809   GLUCOSE 103* 01/19/2013 1430   BUN 26.3* 07/12/2015 0809   BUN 22 06/14/2015 0829   CREATININE 1.9* 07/12/2015 0809   CREATININE 1.54* 06/14/2015 0829   CALCIUM 9.5 07/12/2015 0809   CALCIUM 9.9 01/19/2013 1430   PROT 7.5 03/07/2015 1245   ALBUMIN 3.4* 03/07/2015 1245   AST 19 03/07/2015 1245   ALT 17 03/07/2015 1245   ALKPHOS 71 03/07/2015 1245   BILITOT 0.34 03/07/2015 1245   GFRNONAA 46* 01/19/2013 1430   GFRAA 53* 01/19/2013 1430    No results found for: SPEP, UPEP  Lab Results  Component Value Date   WBC 5.7 07/12/2015   NEUTROABS 3.1 07/12/2015   HGB 11.0* 07/12/2015   HCT 33.6* 07/12/2015   MCV 97.4 07/12/2015   PLT 256 07/12/2015      Chemistry      Component Value Date/Time   NA 140 07/12/2015 0809   NA 139 01/19/2013 1430   K 4.3 07/12/2015 0809   K 4.6 01/19/2013 1430   CL 106 01/19/2013 1430   CO2 22 07/12/2015 0809   CO2 25 01/19/2013 1430   BUN 26.3* 07/12/2015 0809   BUN 22 06/14/2015 0829   CREATININE 1.9* 07/12/2015 0809   CREATININE 1.54* 06/14/2015 0829      Component Value Date/Time   CALCIUM 9.5 07/12/2015 0809   CALCIUM 9.9 01/19/2013 1430   ALKPHOS 71 03/07/2015 1245   AST 19 03/07/2015 1245   ALT 17 03/07/2015 1245   BILITOT 0.34 03/07/2015 1245       RADIOGRAPHIC STUDIES:I reviewed the CT scan and MRI with the patient and family I have personally reviewed the radiological images as listed and agreed with the findings in the report.    ASSESSMENT & PLAN:  Iron deficiency anemia He has responded well to oral iron. I suspect he may have a component of anemia  of chronic disease due to recent diagnosis of cancer. He is not symptomatic. 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 He will continue medical management.    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 He may benefit from ESA in the future.   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 Due to the small size of the disease, hopefully he would be a surgical candidate for resection.  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 He has appointment to follow with urologist. I will try to coordinate his care so  that if he needed surgery, he could have combine duodenal resection and kidney resection at the same time. Currently, he is not symptomatic and has no signs of hematuria or flank pain    No orders of the defined types were placed in this encounter.   All questions were answered. The patient knows to call the clinic with any problems, questions or concerns. No barriers to learning was detected. I spent 20 minutes counseling the patient face to face. The total time spent in the appointment was 25 minutes and more than 50% was on counseling and review of test results     [facility], [place], MD 07/12/2015 9:59 AM

## 2015-07-12 NOTE — Assessment & Plan Note (Signed)
CT and MRI is suspicious for renal cell carcinoma. He has appointment to follow with urologist. I will try to coordinate his care so that if he needed surgery, he could have combine duodenal resection and kidney resection at the same time. Currently, he is not symptomatic and has no signs of hematuria or flank pain

## 2015-07-12 NOTE — Assessment & Plan Note (Signed)
The patient was found to have duodenal mass and biopsy confirmed GIST His case will be presented at the next GI tumor board on 07/18/2015. Due to the small size of the disease, hopefully he would be a surgical candidate for resection.

## 2015-07-12 NOTE — Assessment & Plan Note (Addendum)
He has responded well to oral iron. I suspect he may have a component of anemia of chronic disease due to recent diagnosis of cancer. He is not symptomatic. Observe for now

## 2015-07-13 LAB — ERYTHROPOIETIN: Erythropoietin: 17.3 m[IU]/mL (ref 2.6–18.5)

## 2015-07-13 LAB — VITAMIN B12: VITAMIN B 12: 556 pg/mL (ref 211–946)

## 2015-07-19 DIAGNOSIS — R972 Elevated prostate specific antigen [PSA]: Secondary | ICD-10-CM | POA: Diagnosis not present

## 2015-07-19 DIAGNOSIS — Z0000 Encounter for general adult medical examination without abnormal findings: Secondary | ICD-10-CM | POA: Diagnosis not present

## 2015-07-19 DIAGNOSIS — C642 Malignant neoplasm of left kidney, except renal pelvis: Secondary | ICD-10-CM | POA: Diagnosis not present

## 2015-07-19 DIAGNOSIS — N400 Enlarged prostate without lower urinary tract symptoms: Secondary | ICD-10-CM | POA: Diagnosis not present

## 2015-07-23 ENCOUNTER — Encounter: Payer: Self-pay | Admitting: *Deleted

## 2015-07-23 NOTE — Progress Notes (Signed)
Oncology Nurse Navigator Documentation  Oncology Nurse Navigator Flowsheets 07/23/2015  Navigator Location [facility]  Navigator Encounter Type Letter/Fax/Email  Barriers/Navigation Needs Coordination of Care  Interventions Coordination of Care-faxed urology note to [facility] and alerted Dr. Clyda Greener nurse it will be needed for visit there on 08/03/15; note given to HIM with request from Dr. Alvy Bimler to scan soon.  Time Spent with Patient 15

## 2015-08-06 ENCOUNTER — Ambulatory Visit: Payer: Self-pay | Admitting: Surgery

## 2015-08-06 DIAGNOSIS — C49A3 Gastrointestinal stromal tumor of small intestine: Secondary | ICD-10-CM | POA: Diagnosis not present

## 2015-08-06 NOTE — H&P (Signed)
Mike Roach 08/06/2015 8:48 AM Location: [facility] Patient #: [id number] DOB: [birth year] Single / Language: Mike Roach / Race: Black or African American Male  History of Present Illness Mike Hector MD; 08/06/2015 3:56 PM) Patient words: Stomach/kideny tumor.  The patient is a 75 year old male who presents with a gastrointestinal foreign body. Note for "Gastrointestinal foreign body": Patient sent for surgical consultation by Dr. Phebe Colla with [facility]. 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 Request combined excision at the time of partial nephrectomy.  Pleasant active gentleman. 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 Underwent upper and lower endoscopy. Massive bulging at upper duodenum. Biopsies benign but concerning. Endorectal ultrasound done showed duodenal wall mass. Biopsies consistent with spindle cell neoplasm. Surgical consultation recommended. Patient also with left kidney mass. Concerning for possible renal cell cancer. Saw urology. Recommendation consider partial nephrectomy. Seen by hematology for anemia. Discussion made about coordination of surgeries.  Patient comes in today by himself. He's never abdominal surgery. He can walk a half hour without difficulty. He does not smoke. Usually has a bowel movement every day. He is convinced the iron gives him diarrhea when he takes it in the morning. Has some reflux controlled on omeprazole. On just maybe aspirin. No personal nor family history of GI/colon cancer, inflammatory bowel disease, irritable bowel syndrome, allergy such as Celiac Sprue, dietary/dairy problems, colitis, ulcers nor gastritis. No recent sick contacts/gastroenteritis. No travel outside the country. No changes in diet. No dysphagia to solids or liquids. No hematochezia, hematemesis, coffee ground emesis. No evidence of prior gastric/peptic ulceration.   Other Problems Patsey Berthold, CMA; 08/06/2015  8:48 AM) Arthritis Enlarged Prostate Hemorrhoids High blood pressure  Past Surgical History Patsey Berthold, [place]; 08/06/2015 8:48 AM) Knee Surgery Right.  Diagnostic Studies History Patsey Berthold, [place]; 08/06/2015 8:48 AM) Colonoscopy 1-5 years ago  Allergies Patsey Berthold, [place]; 08/06/2015 8:49 AM) No Known Drug Allergies 08/06/2015  Medication History Patsey Berthold, CMA; 08/06/2015 8:51 AM) Dorzolamide HCl-Timolol Mal (22.3-6.8MG /ML Solution, Ophthalmic) Active. Finasteride (5MG  Tablet, Oral) Active. Lisinopril (20MG  Tablet, Oral) Active. Omeprazole (20MG  Capsule DR, Oral) Active. Simvastatin (20MG  Tablet, Oral) Active. Artificial Tears (0.5-1.4) (Ophthalmic) Active. Aspirin (81MG  Tablet DR, Oral) Active. Multivitamin Adult (Oral) Active. Omega 3 (1000MG  Capsule, Oral) Active. Medications Reconciled  Social History Mike Hector, MD; 08/06/2015 9:35 AM) Caffeine use Coffee, Tea. No drug use Tobacco use Former smoker. Semiretired Engineer, structural with [employer]  Family History Patsey Berthold, [place]; 08/06/2015 8:48 AM) Arthritis Father, Mother. Diabetes Mellitus Brother. Heart Disease Brother. Heart disease in male family member before age 2 Hypertension Brother.     Review of Systems Patsey Berthold CMA; 08/06/2015 8:48 AM) General Present- Weight Loss. Not Present- Appetite Loss, Chills, Fatigue, Fever, Night Sweats and Weight Gain. HEENT Present- Hearing Loss. Not Present- Earache, Hoarseness, Nose Bleed, Oral Ulcers, Ringing in the Ears, Seasonal Allergies, Sinus Pain, Sore Throat, Visual Disturbances, Wears glasses/contact lenses and Yellow Eyes. Cardiovascular Not Present- Chest Pain, Difficulty Breathing Lying Down, Leg Cramps, Palpitations, Rapid Heart Rate, Shortness of Breath and Swelling of Extremities. Gastrointestinal Present- Hemorrhoids. Not Present- Abdominal Pain, Bloating, Bloody Stool, Change in Bowel Habits, Chronic diarrhea,  Constipation, Difficulty Swallowing, Excessive gas, Gets full quickly at meals, Indigestion, Nausea, Rectal Pain and Vomiting.  Vitals Patsey Berthold CMA; 08/06/2015 8:51 AM) 08/06/2015 8:51 AM Weight: 212.2 lb Height: 74.5in Height was reported by patient. Body Surface Area: 2.24 m Body Mass Index: 26.88 kg/m  Temp.: 98.26F  Pulse: 56 (Regular)  BP: 120/64 (Sitting,  Left Arm, Standard)      Physical Exam Mike Hector MD; 08/06/2015 9:37 AM)  General Mental Status-Alert. General Appearance-Not in acute distress, Not Sickly. Orientation-Oriented X3. Hydration-Well hydrated. Voice-Normal.  Integumentary Global Assessment Upon inspection and palpation of skin surfaces of the - Axillae: non-tender, no inflammation or ulceration, no drainage. and Distribution of scalp and body hair is normal. General Characteristics Temperature - normal warmth is noted.  Head and Neck Head-normocephalic, atraumatic with no lesions or palpable masses. Face Global Assessment - atraumatic, no absence of expression. Neck Global Assessment - no abnormal movements, no bruit auscultated on the right, no bruit auscultated on the left, no decreased range of motion, non-tender. Trachea-midline. Thyroid Gland Characteristics - non-tender.  Eye Eyeball - Left-Extraocular movements intact, No Nystagmus. Eyeball - Right-Extraocular movements intact, No Nystagmus. Cornea - Left-No Hazy. Cornea - Right-No Hazy. Sclera/Conjunctiva - Left-No scleral icterus, No Discharge. Sclera/Conjunctiva - Right-No scleral icterus, No Discharge. Pupil - Left-Direct reaction to light normal. Pupil - Right-Direct reaction to light normal.  ENMT Ears Pinna - Left - no drainage observed, no generalized tenderness observed. Right - no drainage observed, no generalized tenderness observed. Nose and Sinuses External Inspection of the Nose - no destructive lesion observed. Inspection  of the nares - Left - quiet respiration. Right - quiet respiration. Mouth and Throat Lips - Upper Lip - no fissures observed, no pallor noted. Lower Lip - no fissures observed, no pallor noted. Nasopharynx - no discharge present. Oral Cavity/Oropharynx - Tongue - no dryness observed. Oral Mucosa - no cyanosis observed. Hypopharynx - no evidence of airway distress observed.  Chest and Lung Exam Inspection Movements - Normal and Symmetrical. Accessory muscles - No use of accessory muscles in breathing. Palpation Palpation of the chest reveals - Non-tender. Auscultation Breath sounds - Normal and Clear.  Cardiovascular Auscultation Rhythm - Regular. Murmurs & Other Heart Sounds - Auscultation of the heart reveals - No Murmurs and No Systolic Clicks.  Abdomen Inspection Inspection of the abdomen reveals - No Visible peristalsis and No Abnormal pulsations. Umbilicus - No Bleeding, No Urine drainage. Palpation/Percussion Palpation and Percussion of the abdomen reveal - Soft, Non Tender, No Rebound tenderness, No Rigidity (guarding) and No Cutaneous hyperesthesia. Note: Abdomen soft. Nontender, nondistended. No guarding. No umbilical no other hernias  Male Genitourinary Sexual Maturity Tanner 5 - Adult hair pattern and Adult penile size and shape. Note: No inguinal hernias. Normal external genitalia. Epididymi, testes, and spermatic cords normal without any masses.  Peripheral Vascular Upper Extremity Inspection - Left - No Cyanotic nailbeds, Not Ischemic. Right - No Cyanotic nailbeds, Not Ischemic.  Neurologic Neurologic evaluation reveals -normal attention span and ability to concentrate, able to name objects and repeat phrases. Appropriate fund of knowledge , normal sensation and normal coordination. Mental Status Affect - not angry, not paranoid. Cranial Nerves-Normal Bilaterally. Gait-Normal.  Neuropsychiatric Mental status exam performed with findings of-able to  articulate well with normal speech/language, rate, volume and coherence, thought content normal with ability to perform basic computations and apply abstract reasoning and no evidence of hallucinations, delusions, obsessions or homicidal/suicidal ideation.  Musculoskeletal Global Assessment Spine, Ribs and Pelvis - no instability, subluxation or laxity. Right Upper Extremity - no instability, subluxation or laxity.  Lymphatic Head & Neck  General Head & Neck Lymphatics: Bilateral - Description - No Localized lymphadenopathy. Axillary  General Axillary Region: Bilateral - Description - No Localized lymphadenopathy. Femoral & Inguinal  Generalized Femoral & Inguinal Lymphatics: Left - Description - No Localized lymphadenopathy.  Right - Description - No Localized lymphadenopathy.    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 Second portion antimesenteric near level of the ampulla.  I think he would benefit from excision before this becomes more problematic. Reasonable to do wide excision and closure. Robotic approach. Probably will require endoscopic evaluation for identification of proper location. I will work to try and get good margins without affecting the ampulla.  It is reasonable to coordinate at the same as the partial left nephrectomy for the probable renal cell cancer/solid renal mass. I think would be best if I went first to get rid of the tumor and then they can concentrate on the partial nephrectomy. Would have intraoperative ultrasound available. Would have EGD upper endoscopy available. Help localize the duodenal tumor.  Spent some time going over the anatomy and physiology. Pictures. Patient is still trying to wrap his had around this. He wishes to proceed while there are small before they become more serious of a problem. His male friend agrees.  We will work for a convenient time. The patient's hoping to wait until after mid-July when his training courses over. I think that is reasonable.  Current Plans You are being scheduled for surgery - Our schedulers will call you.  You should hear from our office's scheduling department within 5 working days about the location, date, and time of surgery. We try to make accommodations for patient's preferences in scheduling surgery, but sometimes the OR schedule or the surgeon's schedule prevents [place] from making those accommodations.  If you have not heard from our office [phone]) in 5 working days, call the office and ask for your surgeon's nurse.  If you have other questions about your diagnosis, plan, or surgery, call the office and ask for your surgeon's nurse.  Pt Education - [facility] Free Text Education/Instructions: discussed with patient and provided information. Pt Education - [facility] Laparosopic Post Op HCI (Takeru Bose) Pt Education - [facility] Good Bowel Health (Mihran Lebarron)  Mike Roach, M.D., F.A.C.S. Gastrointestinal and Minimally Invasive Surgery [facility] [address] [place], [place] [address] [phone] Main / Paging

## 2015-08-20 DIAGNOSIS — H353122 Nonexudative age-related macular degeneration, left eye, intermediate dry stage: Secondary | ICD-10-CM | POA: Diagnosis not present

## 2015-08-20 DIAGNOSIS — H353112 Nonexudative age-related macular degeneration, right eye, intermediate dry stage: Secondary | ICD-10-CM | POA: Diagnosis not present

## 2015-08-20 DIAGNOSIS — H401113 Primary open-angle glaucoma, right eye, severe stage: Secondary | ICD-10-CM | POA: Diagnosis not present

## 2015-08-20 DIAGNOSIS — H40053 Ocular hypertension, bilateral: Secondary | ICD-10-CM | POA: Diagnosis not present

## 2015-08-20 DIAGNOSIS — H401123 Primary open-angle glaucoma, left eye, severe stage: Secondary | ICD-10-CM | POA: Diagnosis not present

## 2015-08-27 ENCOUNTER — Other Ambulatory Visit: Payer: Self-pay | Admitting: Urology

## 2015-08-30 DIAGNOSIS — H04123 Dry eye syndrome of bilateral lacrimal glands: Secondary | ICD-10-CM | POA: Diagnosis not present

## 2015-08-30 DIAGNOSIS — H401113 Primary open-angle glaucoma, right eye, severe stage: Secondary | ICD-10-CM | POA: Diagnosis not present

## 2015-08-30 DIAGNOSIS — H04121 Dry eye syndrome of right lacrimal gland: Secondary | ICD-10-CM | POA: Diagnosis not present

## 2015-08-30 DIAGNOSIS — H401123 Primary open-angle glaucoma, left eye, severe stage: Secondary | ICD-10-CM | POA: Diagnosis not present

## 2015-09-10 ENCOUNTER — Ambulatory Visit: Payer: Medicare Other | Admitting: Orthopedic Surgery

## 2015-10-01 ENCOUNTER — Ambulatory Visit (INDEPENDENT_AMBULATORY_CARE_PROVIDER_SITE_OTHER): Payer: Self-pay | Admitting: Orthopedic Surgery

## 2015-10-01 ENCOUNTER — Encounter: Payer: Self-pay | Admitting: Orthopedic Surgery

## 2015-10-01 ENCOUNTER — Ambulatory Visit (HOSPITAL_COMMUNITY)
Admission: RE | Admit: 2015-10-01 | Discharge: 2015-10-01 | Disposition: A | Discharge status: Home / Self Care | Payer: Medicare HMO | Source: Ambulatory Visit | Attending: Orthopedic Surgery | Admitting: Orthopedic Surgery

## 2015-10-01 VITALS — BP 110/65 | Ht 73.0 in | Wt 212.0 lb

## 2015-10-01 DIAGNOSIS — M129 Arthropathy, unspecified: Secondary | ICD-10-CM

## 2015-10-01 DIAGNOSIS — M1711 Unilateral primary osteoarthritis, right knee: Secondary | ICD-10-CM

## 2015-10-01 DIAGNOSIS — M179 Osteoarthritis of knee, unspecified: Secondary | ICD-10-CM | POA: Diagnosis not present

## 2015-10-01 NOTE — Patient Instructions (Signed)
Normal activity    

## 2015-10-01 NOTE — Progress Notes (Signed)
Chief Complaint  Patient presents with  . Follow-up    ANNUAL XRAY RIGHT KNEE, SARK 01/21/13   HPI  Review of Systems  Constitutional: Negative for chills and fever.  Musculoskeletal: Negative for joint pain and myalgias.  Neurological: Negative for tingling.    Past Medical History:  Diagnosis Date  . Adenomatous colon polyp 09/21/2014  . Anemia in chronic illness 03/07/2015   being evaluated  . Arthritis   . Bilateral hearing loss 03/15/2015  . Chronic kidney disease   . Chronic kidney disease (CKD) 03/07/2015  . GERD (gastroesophageal reflux disease)   . Glaucoma   . Hypercholesterolemia   . Hypertension   . Iron deficiency anemia 03/15/2015  . Prostate enlargement   . Skin lesion of left leg 03/07/2015    Past Surgical History:  Procedure Laterality Date  . colonoscopy    . EUS N/A 07/05/2015   Procedure: UPPER ENDOSCOPIC ULTRASOUND (EUS) LINEAR;  Surgeon: Milus Banister, MD;  Location: [facility];  Service: Endoscopy;  Laterality: N/A;  . KNEE ARTHROSCOPY WITH LATERAL MENISECTOMY Right 01/21/2013   Procedure: KNEE ARTHROSCOPY WITH PARTIAL LATERAL MENISECTOMY;  Surgeon: Carole Civil, MD;  Location: [facility];  Service: Orthopedics;  Laterality: Right;  . ORIF ANKLE DISLOCATION Right    No family history on file. Social History  Substance Use Topics  . Smoking status: Former Smoker    Packs/day: 1.00    Years: 20.00    Types: Cigarettes    Quit date: 01/19/1981  . Smokeless tobacco: Never Used  . Alcohol use No    Current Outpatient Prescriptions:  .  ARTIFICIAL TEAR OINTMENT OP, Place 1 application into both eyes daily as needed (For dry eyes.)., Disp: , Rfl:  .  aspirin EC 81 MG tablet, Take 81 mg by mouth daily., Disp: , Rfl:  .  dorzolamide (TRUSOPT) 2 % ophthalmic solution, Place 1 drop into both eyes 2 (two) times daily. , Disp: , Rfl:  .  finasteride (PROSCAR) 5 MG tablet, Take 5 mg by mouth daily., Disp: , Rfl: 3 .  lisinopril (PRINIVIL,ZESTRIL) 20 MG  tablet, Take 20 mg by mouth daily., Disp: , Rfl:  .  Multiple Vitamins-Minerals (MULTIVITAMINS THER. W/MINERALS) TABS tablet, Take 1 tablet by mouth daily., Disp: , Rfl:  .  Omega-3 Fatty Acids (FISH OIL PO), Take 4 capsules by mouth daily., Disp: , Rfl:  .  omeprazole (PRILOSEC) 20 MG capsule, Take 20 mg by mouth daily., Disp: , Rfl:  .  simvastatin (ZOCOR) 20 MG tablet, Take 20 mg by mouth daily., Disp: , Rfl:  .  travoprost, benzalkonium, (TRAVATAN) 0.004 % ophthalmic solution, Place 1 drop into both eyes at bedtime. , Disp: , Rfl:   BP 110/65   Ht 6\' 1"  (1.854 m)   Wt 212 lb (96.2 kg)   BMI 27.97 kg/m   Physical Exam  Constitutional: He is oriented to person, place, and time. He appears well-developed and well-nourished. No distress.  Cardiovascular: Normal rate and intact distal pulses.   Musculoskeletal:       Right knee: He exhibits effusion.  Neurological: He is alert and oriented to person, place, and time.  Skin: Skin is warm and dry. No rash noted. He is not diaphoretic. No erythema. No pallor.  Psychiatric: He has a normal mood and affect. His behavior is normal. Judgment and thought content normal.    Right Ankle Exam   Tenderness  The patient is experiencing tenderness in the ATF.  Right Knee Exam   Tenderness  The patient is experiencing no tenderness.     Range of Motion  The patient has normal right knee ROM.  Muscle Strength   The patient has normal right knee strength.  Tests  Drawer:       Anterior - negative    Posterior - negative  Other  Erythema: absent Scars: absent Sensation: normal Pulse: present Swelling: none Other tests: effusion present  Comments:  Trace effusion    Left Knee Exam  Left knee exam is normal.  Tenderness  The patient is experiencing no tenderness.     Range of Motion  The patient has normal left knee ROM.  Muscle Strength   The patient has normal left knee strength.  Tests  McMurray:  Medial -  negative Lateral - negative Lachman:  Anterior - negative    Posterior - negative Drawer:       Anterior - negative     Posterior - negative Varus: negative Valgus: negative Pivot Shift: negative Patellar Apprehension: negative  Other  Erythema: absent Sensation: normal Pulse: present Swelling: none       ASSESSMENT: My personal interpretation of the images:  X-ray was done at the hospital 3 views right knee. Patient has mild to moderate arthritis lateral compartment   Encounter Diagnosis  Name Primary?  Marland Kitchen Arthritis of right knee Yes    PLAN Repeat x-rays 1 year right knee  Arther Abbott, MD 10/01/2015 9:41 AM

## 2015-10-05 NOTE — Patient Instructions (Signed)
Mike Roach  10/05/2015   Your procedure is scheduled on:    10/17/2015    Report to [facility] Main  Entrance take [place]  elevators to 3rd floor to  [place] at    [place] AM.  Call this number if you have problems the morning of surgery [phone]   Remember: ONLY 1 PERSON MAY GO WITH YOU TO SHORT STAY TO GET  READY MORNING OF [place].  Do not eat food or drink liquids :After Midnight.     Take these medicines the morning of surgery with A SIP OF WATER: artificial tears, trusopt eye drops, prilosec                                 You may not have any metal on your body including hair pins and              piercings  Do not wear jewelry, , lotions, powders or perfumes, deodorant              Men may shave face and neck.   Do not bring valuables to the hospital. [place].  Contacts, dentures or bridgework may not be worn into surgery.  Leave suitcase in the car. After surgery it may be brought to your room.       Special Instructions: coughing and deep breathing exercises, leg exercises               Please read over the following fact sheets you were given: _____________________________________________________________________             [facility] - Preparing for Surgery Before surgery, you can play an important role.  Because skin is not sterile, your skin needs to be as free of germs as possible.  You can reduce the number of germs on your skin by washing with CHG (chlorahexidine gluconate) soap before surgery.  CHG is an antiseptic cleaner which kills germs and bonds with the skin to continue killing germs even after washing. Please DO NOT use if you have an allergy to CHG or antibacterial soaps.  If your skin becomes reddened/irritated stop using the CHG and inform your nurse when you arrive at Short Stay. Do not shave (including legs and underarms) for at least 48 hours prior to the  first CHG shower.  You may shave your face/neck. Please follow these instructions carefully:  1.  Shower with CHG Soap the night before surgery and the  morning of Surgery.  2.  If you choose to wash your hair, wash your hair first as usual with your  normal  shampoo.  3.  After you shampoo, rinse your hair and body thoroughly to remove the  shampoo.                           4.  Use CHG as you would any other liquid soap.  You can apply chg directly  to the skin and wash                       Gently with a scrungie or clean washcloth.  5.  Apply the CHG Soap to your  body ONLY FROM THE NECK DOWN.   Do not use on face/ open                           Wound or open sores. Avoid contact with eyes, ears mouth and genitals (private parts).                       Wash face,  Genitals (private parts) with your normal soap.             6.  Wash thoroughly, paying special attention to the area where your surgery  will be performed.  7.  Thoroughly rinse your body with warm water from the neck down.  8.  DO NOT shower/wash with your normal soap after using and rinsing off  the CHG Soap.                9.  Pat yourself dry with a clean towel.            10.  Wear clean pajamas.            11.  Place clean sheets on your bed the night of your first shower and do not  sleep with pets. Day of Surgery : Do not apply any lotions/deodorants the morning of surgery.  Please wear clean clothes to the hospital/surgery center.  FAILURE TO FOLLOW THESE INSTRUCTIONS MAY RESULT IN THE CANCELLATION OF YOUR SURGERY PATIENT SIGNATURE_________________________________  NURSE SIGNATURE__________________________________  ________________________________________________________________________

## 2015-10-09 ENCOUNTER — Encounter (HOSPITAL_COMMUNITY)
Admission: RE | Admit: 2015-10-09 | Discharge: 2015-10-09 | Disposition: A | Discharge status: Home / Self Care | Payer: Medicare HMO | Source: Ambulatory Visit | Attending: Surgery | Admitting: Surgery

## 2015-10-09 ENCOUNTER — Encounter (INDEPENDENT_AMBULATORY_CARE_PROVIDER_SITE_OTHER): Payer: Self-pay

## 2015-10-09 DIAGNOSIS — C642 Malignant neoplasm of left kidney, except renal pelvis: Secondary | ICD-10-CM | POA: Diagnosis not present

## 2015-10-09 DIAGNOSIS — Z01812 Encounter for preprocedural laboratory examination: Secondary | ICD-10-CM | POA: Diagnosis not present

## 2015-10-09 DIAGNOSIS — N5201 Erectile dysfunction due to arterial insufficiency: Secondary | ICD-10-CM | POA: Diagnosis not present

## 2015-10-09 DIAGNOSIS — Z01810 Encounter for preprocedural cardiovascular examination: Secondary | ICD-10-CM | POA: Insufficient documentation

## 2015-10-09 DIAGNOSIS — R972 Elevated prostate specific antigen [PSA]: Secondary | ICD-10-CM | POA: Diagnosis not present

## 2015-10-09 LAB — BASIC METABOLIC PANEL
ANION GAP: 6 (ref 5–15)
BUN: 31 mg/dL — ABNORMAL HIGH (ref 6–20)
CALCIUM: 9.2 mg/dL (ref 8.9–10.3)
CO2: 22 mmol/L (ref 22–32)
CREATININE: 1.62 mg/dL — AB (ref 0.61–1.24)
Chloride: 111 mmol/L (ref 101–111)
GFR calc Af Amer: 47 mL/min — ABNORMAL LOW (ref >60)
GFR, EST NON AFRICAN AMERICAN: 40 mL/min — AB (ref >60)
GLUCOSE: 98 mg/dL (ref 65–99)
Potassium: 4.1 mmol/L (ref 3.5–5.1)
Sodium: 139 mmol/L (ref 135–145)

## 2015-10-09 LAB — CBC
HCT: 34.9 % — ABNORMAL LOW (ref 39.0–52.0)
HEMOGLOBIN: 11.8 g/dL — AB (ref 13.0–17.0)
MCH: 32.9 pg (ref 26.0–34.0)
MCHC: 33.8 g/dL (ref 30.0–36.0)
MCV: 97.2 fL (ref 78.0–100.0)
PLATELETS: 266 10*3/uL (ref 150–400)
RBC: 3.59 MIL/uL — ABNORMAL LOW (ref 4.22–5.81)
RDW: 14.2 % (ref 11.5–15.5)
WBC: 4.1 10*3/uL (ref 4.0–10.5)

## 2015-10-09 LAB — ABO/RH: ABO/RH(D): O POS

## 2015-10-09 NOTE — Progress Notes (Signed)
BMP done 10/09/15 faxed via EPIC to Dr Johney Maine and Dr Tresa Moore.

## 2015-10-09 NOTE — Progress Notes (Signed)
DR Ola Spurr ( anesthesia)  Made aware of EKG from 10/09/2015 with heart rate of 42.  Patient voices no complanits.  Only hx of hypertension.  EKG from 2014 shown to Dr Ola Spurr.  I looked back at office visit notes in EPIC to 03/2015 and ntoed pulse to be anywherew from 54-85.  DR Ola Spurr aware.  No new orders given.

## 2015-10-10 NOTE — Progress Notes (Signed)
Final EKg done 10/09/15-EPIC

## 2015-10-17 ENCOUNTER — Inpatient Hospital Stay (HOSPITAL_COMMUNITY)
Admission: RE | Admit: 2015-10-17 | Discharge: 2015-10-21 | DRG: 982 | Disposition: A | Discharge status: Home / Self Care | Payer: Medicare HMO | Source: Ambulatory Visit | Attending: Surgery | Admitting: Surgery

## 2015-10-17 ENCOUNTER — Encounter (HOSPITAL_COMMUNITY)
Admission: RE | Disposition: A | Discharge status: Home / Self Care | Payer: Self-pay | Source: Ambulatory Visit | Attending: Surgery

## 2015-10-17 ENCOUNTER — Inpatient Hospital Stay (HOSPITAL_COMMUNITY): Payer: Medicare HMO | Admitting: Anesthesiology

## 2015-10-17 ENCOUNTER — Encounter (HOSPITAL_COMMUNITY): Payer: Self-pay | Admitting: *Deleted

## 2015-10-17 DIAGNOSIS — K3189 Other diseases of stomach and duodenum: Secondary | ICD-10-CM | POA: Diagnosis present

## 2015-10-17 DIAGNOSIS — I129 Hypertensive chronic kidney disease with stage 1 through stage 4 chronic kidney disease, or unspecified chronic kidney disease: Secondary | ICD-10-CM | POA: Diagnosis not present

## 2015-10-17 DIAGNOSIS — N2889 Other specified disorders of kidney and ureter: Secondary | ICD-10-CM | POA: Diagnosis not present

## 2015-10-17 DIAGNOSIS — N183 Chronic kidney disease, stage 3 unspecified: Secondary | ICD-10-CM | POA: Diagnosis present

## 2015-10-17 DIAGNOSIS — R338 Other retention of urine: Secondary | ICD-10-CM | POA: Diagnosis not present

## 2015-10-17 DIAGNOSIS — Z8249 Family history of ischemic heart disease and other diseases of the circulatory system: Secondary | ICD-10-CM

## 2015-10-17 DIAGNOSIS — K219 Gastro-esophageal reflux disease without esophagitis: Secondary | ICD-10-CM | POA: Diagnosis not present

## 2015-10-17 DIAGNOSIS — N281 Cyst of kidney, acquired: Secondary | ICD-10-CM | POA: Diagnosis present

## 2015-10-17 DIAGNOSIS — H9193 Unspecified hearing loss, bilateral: Secondary | ICD-10-CM | POA: Diagnosis present

## 2015-10-17 DIAGNOSIS — I10 Essential (primary) hypertension: Secondary | ICD-10-CM | POA: Diagnosis present

## 2015-10-17 DIAGNOSIS — C642 Malignant neoplasm of left kidney, except renal pelvis: Secondary | ICD-10-CM

## 2015-10-17 DIAGNOSIS — Z79899 Other long term (current) drug therapy: Secondary | ICD-10-CM

## 2015-10-17 DIAGNOSIS — Z7982 Long term (current) use of aspirin: Secondary | ICD-10-CM

## 2015-10-17 DIAGNOSIS — N289 Disorder of kidney and ureter, unspecified: Secondary | ICD-10-CM

## 2015-10-17 DIAGNOSIS — Z87891 Personal history of nicotine dependence: Secondary | ICD-10-CM

## 2015-10-17 DIAGNOSIS — R339 Retention of urine, unspecified: Secondary | ICD-10-CM | POA: Diagnosis not present

## 2015-10-17 DIAGNOSIS — C49A3 Gastrointestinal stromal tumor of small intestine: Secondary | ICD-10-CM | POA: Diagnosis not present

## 2015-10-17 DIAGNOSIS — Z833 Family history of diabetes mellitus: Secondary | ICD-10-CM

## 2015-10-17 DIAGNOSIS — N189 Chronic kidney disease, unspecified: Secondary | ICD-10-CM | POA: Diagnosis present

## 2015-10-17 DIAGNOSIS — K567 Ileus, unspecified: Secondary | ICD-10-CM | POA: Diagnosis not present

## 2015-10-17 DIAGNOSIS — E7800 Pure hypercholesterolemia, unspecified: Secondary | ICD-10-CM | POA: Diagnosis present

## 2015-10-17 DIAGNOSIS — D638 Anemia in other chronic diseases classified elsewhere: Secondary | ICD-10-CM | POA: Diagnosis present

## 2015-10-17 HISTORY — PX: ROBOTIC ASSITED PARTIAL NEPHRECTOMY: SHX6087

## 2015-10-17 LAB — TYPE AND SCREEN
ABO/RH(D): O POS
ANTIBODY SCREEN: NEGATIVE

## 2015-10-17 SURGERY — FUNDOPLICATION, NISSEN, ROBOT-ASSISTED, LAPAROSCOPIC
Anesthesia: General

## 2015-10-17 MED ORDER — OXYCODONE HCL 5 MG PO TABS: 5.0000 mg | ORAL_TABLET | Freq: Once | ORAL | Status: DC | PRN

## 2015-10-17 MED ORDER — ONDANSETRON HCL 40 MG/20ML IJ SOLN
8.0000 mg | Freq: Four times a day (QID) | INTRAMUSCULAR | Status: DC | PRN
  Filled 2015-10-17: qty 4

## 2015-10-17 MED ORDER — ROCURONIUM BROMIDE 100 MG/10ML IV SOLN
INTRAVENOUS | Status: DC | PRN
  Administered 2015-10-17: 50 mg via INTRAVENOUS
  Administered 2015-10-17: 20 mg via INTRAVENOUS
  Administered 2015-10-17: 10 mg via INTRAVENOUS
  Administered 2015-10-17 (×2): 20 mg via INTRAVENOUS
  Administered 2015-10-17: 10 mg via INTRAVENOUS

## 2015-10-17 MED ORDER — GLYCOPYRROLATE 0.2 MG/ML IJ SOLN
INTRAMUSCULAR | Status: DC | PRN
  Administered 2015-10-17: 0.2 mg via INTRAVENOUS

## 2015-10-17 MED ORDER — DEXTROSE 5 % IV SOLN
INTRAVENOUS | Status: DC | PRN
  Administered 2015-10-17: 10 ug/min via INTRAVENOUS

## 2015-10-17 MED ORDER — FENTANYL CITRATE (PF) 100 MCG/2ML IJ SOLN
INTRAMUSCULAR | Status: AC
  Filled 2015-10-17: qty 2

## 2015-10-17 MED ORDER — ACETAMINOPHEN 650 MG RE SUPP: 650.0000 mg | Freq: Four times a day (QID) | RECTAL | Status: DC | PRN

## 2015-10-17 MED ORDER — CEFOTETAN DISODIUM-DEXTROSE 2-2.08 GM-% IV SOLR
2.0000 g | INTRAVENOUS | Status: AC
  Administered 2015-10-17: 2 g via INTRAVENOUS

## 2015-10-17 MED ORDER — CEFAZOLIN SODIUM-DEXTROSE 2-4 GM/100ML-% IV SOLN
INTRAVENOUS | Status: AC
  Filled 2015-10-17: qty 100

## 2015-10-17 MED ORDER — LIP MEDEX EX OINT
1.0000 "application " | TOPICAL_OINTMENT | Freq: Two times a day (BID) | CUTANEOUS | Status: DC
  Administered 2015-10-17 – 2015-10-21 (×8): 1 via TOPICAL
  Filled 2015-10-17 (×3): qty 7

## 2015-10-17 MED ORDER — ALUM & MAG HYDROXIDE-SIMETH 200-200-20 MG/5ML PO SUSP: 30.0000 mL | Freq: Four times a day (QID) | ORAL | Status: DC | PRN

## 2015-10-17 MED ORDER — SIMETHICONE 80 MG PO CHEW: 40.0000 mg | CHEWABLE_TABLET | Freq: Four times a day (QID) | ORAL | Status: DC | PRN

## 2015-10-17 MED ORDER — ONDANSETRON HCL 4 MG/2ML IJ SOLN
4.0000 mg | Freq: Four times a day (QID) | INTRAMUSCULAR | Status: DC | PRN
Start: 2015-10-17 — End: 2015-10-21

## 2015-10-17 MED ORDER — SUGAMMADEX SODIUM 200 MG/2ML IV SOLN
INTRAVENOUS | Status: AC
  Filled 2015-10-17: qty 2

## 2015-10-17 MED ORDER — SUGAMMADEX SODIUM 200 MG/2ML IV SOLN
INTRAVENOUS | Status: DC | PRN
  Administered 2015-10-17: 200 mg via INTRAVENOUS

## 2015-10-17 MED ORDER — METOPROLOL TARTRATE 12.5 MG HALF TABLET: 12.5000 mg | ORAL_TABLET | Freq: Two times a day (BID) | ORAL | Status: DC | PRN

## 2015-10-17 MED ORDER — PROCHLORPERAZINE EDISYLATE 5 MG/ML IJ SOLN: 5.0000 mg | INTRAMUSCULAR | Status: DC | PRN

## 2015-10-17 MED ORDER — TRAMADOL HCL 50 MG PO TABS: 50.0000 mg | ORAL_TABLET | Freq: Four times a day (QID) | ORAL | 0 refills | Status: DC | PRN

## 2015-10-17 MED ORDER — SODIUM CHLORIDE 0.9 % IJ SOLN
INTRAMUSCULAR | Status: AC
  Filled 2015-10-17: qty 10

## 2015-10-17 MED ORDER — FENTANYL CITRATE (PF) 100 MCG/2ML IJ SOLN
INTRAMUSCULAR | Status: DC | PRN
  Administered 2015-10-17 (×2): 50 ug via INTRAVENOUS
  Administered 2015-10-17: 100 ug via INTRAVENOUS

## 2015-10-17 MED ORDER — METHOCARBAMOL 1000 MG/10ML IJ SOLN
1000.0000 mg | Freq: Four times a day (QID) | INTRAMUSCULAR | Status: DC | PRN
  Filled 2015-10-17: qty 10

## 2015-10-17 MED ORDER — CHLORHEXIDINE GLUCONATE 4 % EX LIQD: 1.0000 "application " | Freq: Once | CUTANEOUS | Status: DC

## 2015-10-17 MED ORDER — OXYCODONE HCL 5 MG/5ML PO SOLN
5.0000 mg | Freq: Once | ORAL | Status: DC | PRN
  Filled 2015-10-17: qty 5

## 2015-10-17 MED ORDER — MAGIC MOUTHWASH
15.0000 mL | Freq: Four times a day (QID) | ORAL | Status: DC | PRN
  Filled 2015-10-17: qty 15

## 2015-10-17 MED ORDER — ONDANSETRON 4 MG PO TBDP: 4.0000 mg | ORAL_TABLET | Freq: Four times a day (QID) | ORAL | Status: DC | PRN

## 2015-10-17 MED ORDER — LACTATED RINGERS IV SOLN: INTRAVENOUS | Status: DC

## 2015-10-17 MED ORDER — DIPHENHYDRAMINE HCL 50 MG/ML IJ SOLN
12.5000 mg | Freq: Four times a day (QID) | INTRAMUSCULAR | Status: DC | PRN
Start: 2015-10-17 — End: 2015-10-21

## 2015-10-17 MED ORDER — DEXTROSE 5 % IV SOLN
2.0000 g | Freq: Two times a day (BID) | INTRAVENOUS | Status: AC
  Administered 2015-10-17: 2 g via INTRAVENOUS
  Filled 2015-10-17: qty 2

## 2015-10-17 MED ORDER — SODIUM CHLORIDE 0.9 % IJ SOLN
INTRAMUSCULAR | Status: AC
  Filled 2015-10-17: qty 20

## 2015-10-17 MED ORDER — LIDOCAINE HCL (CARDIAC) 20 MG/ML IV SOLN
INTRAVENOUS | Status: DC | PRN
  Administered 2015-10-17: 50 mg via INTRAVENOUS

## 2015-10-17 MED ORDER — GABAPENTIN 300 MG PO CAPS
300.0000 mg | ORAL_CAPSULE | ORAL | Status: AC
  Administered 2015-10-17: 300 mg via ORAL
  Filled 2015-10-17: qty 1

## 2015-10-17 MED ORDER — PHENYLEPHRINE HCL 10 MG/ML IJ SOLN
INTRAMUSCULAR | Status: AC
  Filled 2015-10-17: qty 1

## 2015-10-17 MED ORDER — LACTATED RINGERS IV BOLUS (SEPSIS): 1000.0000 mL | Freq: Three times a day (TID) | INTRAVENOUS | Status: AC | PRN

## 2015-10-17 MED ORDER — LACTATED RINGERS IV SOLN
INTRAVENOUS | Status: DC | PRN
  Administered 2015-10-17 (×3): via INTRAVENOUS

## 2015-10-17 MED ORDER — CEFOTETAN DISODIUM-DEXTROSE 2-2.08 GM-% IV SOLR
INTRAVENOUS | Status: AC
  Filled 2015-10-17: qty 50

## 2015-10-17 MED ORDER — ACETAMINOPHEN 325 MG PO TABS
650.0000 mg | ORAL_TABLET | ORAL | Status: AC
  Administered 2015-10-17: 650 mg via ORAL
  Filled 2015-10-17: qty 2

## 2015-10-17 MED ORDER — BUPIVACAINE LIPOSOME 1.3 % IJ SUSP
20.0000 mL | Freq: Once | INTRAMUSCULAR | Status: AC
  Administered 2015-10-17: 20 mL
  Filled 2015-10-17: qty 20

## 2015-10-17 MED ORDER — CEFAZOLIN SODIUM-DEXTROSE 2-4 GM/100ML-% IV SOLN
2.0000 g | INTRAVENOUS | Status: DC
  Filled 2015-10-17: qty 100

## 2015-10-17 MED ORDER — ONDANSETRON HCL 4 MG/2ML IJ SOLN: 4.0000 mg | Freq: Four times a day (QID) | INTRAMUSCULAR | Status: DC | PRN

## 2015-10-17 MED ORDER — ENOXAPARIN SODIUM 40 MG/0.4ML ~~LOC~~ SOLN
40.0000 mg | SUBCUTANEOUS | Status: DC
  Administered 2015-10-18 – 2015-10-21 (×4): 40 mg via SUBCUTANEOUS
  Filled 2015-10-17 (×4): qty 0.4

## 2015-10-17 MED ORDER — LACTATED RINGERS IR SOLN
Status: DC | PRN
  Administered 2015-10-17: 1000 mL

## 2015-10-17 MED ORDER — ONDANSETRON HCL 4 MG/2ML IJ SOLN
INTRAMUSCULAR | Status: DC | PRN
Start: 2015-10-17 — End: 2015-10-17
  Administered 2015-10-17: 4 mg via INTRAVENOUS

## 2015-10-17 MED ORDER — ALBUTEROL SULFATE (2.5 MG/3ML) 0.083% IN NEBU: 2.5000 mg | INHALATION_SOLUTION | Freq: Four times a day (QID) | RESPIRATORY_TRACT | Status: DC | PRN

## 2015-10-17 MED ORDER — MENTHOL 3 MG MT LOZG: 1.0000 | LOZENGE | OROMUCOSAL | Status: DC | PRN

## 2015-10-17 MED ORDER — PHENOL 1.4 % MT LIQD
2.0000 | OROMUCOSAL | Status: DC | PRN
  Filled 2015-10-17: qty 177

## 2015-10-17 MED ORDER — HYDROMORPHONE HCL 2 MG/ML IJ SOLN
INTRAMUSCULAR | Status: AC
  Filled 2015-10-17: qty 1

## 2015-10-17 MED ORDER — BISACODYL 10 MG RE SUPP: 10.0000 mg | Freq: Two times a day (BID) | RECTAL | Status: DC | PRN

## 2015-10-17 MED ORDER — BUPIVACAINE-EPINEPHRINE 0.25% -1:200000 IJ SOLN
INTRAMUSCULAR | Status: AC
  Filled 2015-10-17: qty 1

## 2015-10-17 MED ORDER — HYDROMORPHONE HCL 1 MG/ML IJ SOLN
0.2500 mg | INTRAMUSCULAR | Status: DC | PRN
  Administered 2015-10-17: 0.5 mg via INTRAVENOUS

## 2015-10-17 MED ORDER — EPHEDRINE SULFATE 50 MG/ML IJ SOLN
INTRAMUSCULAR | Status: DC | PRN
  Administered 2015-10-17: 5 mg via INTRAVENOUS
  Administered 2015-10-17 (×2): 10 mg via INTRAVENOUS

## 2015-10-17 MED ORDER — BUPIVACAINE-EPINEPHRINE 0.25% -1:200000 IJ SOLN
INTRAMUSCULAR | Status: DC | PRN
  Administered 2015-10-17: 5 mL

## 2015-10-17 MED ORDER — MANNITOL 25 % IV SOLN
25.0000 g | Freq: Once | INTRAVENOUS | Status: DC
  Filled 2015-10-17: qty 100

## 2015-10-17 MED ORDER — HYDROMORPHONE HCL 1 MG/ML IJ SOLN
0.5000 mg | INTRAMUSCULAR | Status: DC | PRN
Start: 2015-10-17 — End: 2015-10-21
  Administered 2015-10-17 – 2015-10-18 (×7): 1 mg via INTRAVENOUS
  Administered 2015-10-19: 0.5 mg via INTRAVENOUS
  Administered 2015-10-19 (×3): 1 mg via INTRAVENOUS
  Filled 2015-10-17 (×11): qty 1

## 2015-10-17 MED ORDER — ROCURONIUM BROMIDE 100 MG/10ML IV SOLN
INTRAVENOUS | Status: AC
  Filled 2015-10-17: qty 1

## 2015-10-17 MED ORDER — HYDRALAZINE HCL 20 MG/ML IJ SOLN: 10.0000 mg | INTRAMUSCULAR | Status: DC | PRN

## 2015-10-17 MED ORDER — EPHEDRINE SULFATE 50 MG/ML IJ SOLN
INTRAMUSCULAR | Status: AC
Start: 2015-10-17 — End: 2015-10-17
  Filled 2015-10-17: qty 1

## 2015-10-17 MED ORDER — LACTATED RINGERS IV SOLN
INTRAVENOUS | Status: DC | PRN
  Administered 2015-10-17 (×3): via INTRAVENOUS

## 2015-10-17 MED ORDER — PROPOFOL 10 MG/ML IV BOLUS
INTRAVENOUS | Status: AC
  Filled 2015-10-17: qty 20

## 2015-10-17 MED ORDER — ONDANSETRON HCL 4 MG/2ML IJ SOLN
INTRAMUSCULAR | Status: AC
  Filled 2015-10-17: qty 2

## 2015-10-17 MED ORDER — PROPOFOL 10 MG/ML IV BOLUS
INTRAVENOUS | Status: DC | PRN
  Administered 2015-10-17: 150 mg via INTRAVENOUS

## 2015-10-17 MED ORDER — LACTATED RINGERS IV SOLN
INTRAVENOUS | Status: DC
  Administered 2015-10-17: 23:00:00 via INTRAVENOUS
  Administered 2015-10-18: 1000 mL via INTRAVENOUS

## 2015-10-17 SURGICAL SUPPLY — 108 items
APPLICATOR SURGIFLO ENDO (HEMOSTASIS) ×6 IMPLANT
APPLIER CLIP 5 13 M/L LIGAMAX5 (MISCELLANEOUS)
APPLIER CLIP ROT 10 11.4 M/L (STAPLE)
BLADE SURG SZ11 CARB STEEL (BLADE) ×3 IMPLANT
CANNULA REDUC XI 12-8 STAPL (CANNULA) ×1
CANNULA REDUCER 12-8 DVNC XI (CANNULA) ×2 IMPLANT
CHLORAPREP W/TINT 26ML (MISCELLANEOUS) ×6 IMPLANT
CLIP APPLIE 5 13 M/L LIGAMAX5 (MISCELLANEOUS) IMPLANT
CLIP APPLIE ROT 10 11.4 M/L (STAPLE) IMPLANT
CLIP LIGATING HEM O LOK PURPLE (MISCELLANEOUS) ×3 IMPLANT
CLIP LIGATING HEMO LOK XL GOLD (MISCELLANEOUS) ×3 IMPLANT
CLIP LIGATING HEMO O LOK GREEN (MISCELLANEOUS) ×3 IMPLANT
CLIP SUT LAPRA TY ABSORB (SUTURE) ×3 IMPLANT
COVER TIP SHEARS 8 DVNC (MISCELLANEOUS) ×4 IMPLANT
COVER TIP SHEARS 8MM DA VINCI (MISCELLANEOUS) ×2
DECANTER SPIKE VIAL GLASS SM (MISCELLANEOUS) ×6 IMPLANT
DRAIN CHANNEL 15F RND FF 3/16 (WOUND CARE) IMPLANT
DRAIN CHANNEL 19F RND (DRAIN) ×3 IMPLANT
DRAIN PENROSE 18X1/2 LTX STRL (DRAIN) ×3 IMPLANT
DRAPE ARM DVNC X/XI (DISPOSABLE) ×8 IMPLANT
DRAPE COLUMN DVNC XI (DISPOSABLE) ×4 IMPLANT
DRAPE DA VINCI XI ARM (DISPOSABLE) ×4
DRAPE DA VINCI XI COLUMN (DISPOSABLE) ×2
DRAPE INCISE IOBAN 66X45 STRL (DRAPES) ×6 IMPLANT
DRAPE LAPAROSCOPIC ABDOMINAL (DRAPES) ×3 IMPLANT
DRAPE SHEET LG 3/4 BI-LAMINATE (DRAPES) ×9 IMPLANT
DRSG TEGADERM 2-3/8X2-3/4 SM (GAUZE/BANDAGES/DRESSINGS) IMPLANT
DRSG TEGADERM 4X4.75 (GAUZE/BANDAGES/DRESSINGS) ×3 IMPLANT
ELECT PENCIL ROCKER SW 15FT (MISCELLANEOUS) ×3 IMPLANT
ELECT REM PT RETURN 15FT ADLT (MISCELLANEOUS) IMPLANT
ELECT REM PT RETURN 9FT ADLT (ELECTROSURGICAL) ×3
ELECTRODE REM PT RTRN 9FT ADLT (ELECTROSURGICAL) ×2 IMPLANT
ENDOLOOP SUT PDS II  0 18 (SUTURE)
ENDOLOOP SUT PDS II 0 18 (SUTURE) IMPLANT
EVACUATOR SILICONE 100CC (DRAIN) ×3 IMPLANT
GAUZE SPONGE 2X2 8PLY STRL LF (GAUZE/BANDAGES/DRESSINGS) ×2 IMPLANT
GLOVE BIO SURGEON STRL SZ 6.5 (GLOVE) ×6 IMPLANT
GLOVE BIOGEL M STRL SZ7.5 (GLOVE) ×6 IMPLANT
GLOVE ECLIPSE 8.0 STRL XLNG CF (GLOVE) ×6 IMPLANT
GLOVE INDICATOR 8.0 STRL GRN (GLOVE) ×6 IMPLANT
GOWN STRL REUS W/TWL LRG LVL3 (GOWN DISPOSABLE) ×21 IMPLANT
GOWN STRL REUS W/TWL XL LVL3 (GOWN DISPOSABLE) ×12 IMPLANT
HEMOSTAT SURGICEL 4X8 (HEMOSTASIS) ×3 IMPLANT
IRRIG SUCT STRYKERFLOW 2 WTIP (MISCELLANEOUS) ×6
IRRIGATION SUCT STRKRFLW 2 WTP (MISCELLANEOUS) ×4 IMPLANT
KIT BASIN OR (CUSTOM PROCEDURE TRAY) ×3 IMPLANT
LIQUID BAND (GAUZE/BANDAGES/DRESSINGS) ×6 IMPLANT
LOOP VESSEL MAXI BLUE (MISCELLANEOUS) ×3 IMPLANT
MARKER SKIN DUAL TIP RULER LAB (MISCELLANEOUS) ×3 IMPLANT
NEEDLE HYPO 22GX1.5 SAFETY (NEEDLE) ×3 IMPLANT
NEEDLE INSUFFLATION 14GA 120MM (NEEDLE) ×3 IMPLANT
NS IRRIG 1000ML POUR BTL (IV SOLUTION) IMPLANT
PACK CARDIOVASCULAR III (CUSTOM PROCEDURE TRAY) ×3 IMPLANT
PAD POSITIONING PINK XL (MISCELLANEOUS) ×3 IMPLANT
PORT ACCESS TROCAR AIRSEAL 12 (TROCAR) ×2 IMPLANT
PORT ACCESS TROCAR AIRSEAL 5M (TROCAR) ×1
POSITIONER SURGICAL ARM (MISCELLANEOUS) ×6 IMPLANT
POUCH RETRIEVAL ECOSAC 10 (ENDOMECHANICALS) ×2 IMPLANT
POUCH RETRIEVAL ECOSAC 10MM (ENDOMECHANICALS) ×1
POUCH SPECIMEN RETRIEVAL 10MM (ENDOMECHANICALS) ×6 IMPLANT
RELOAD STAPLER WHITE 60MM (STAPLE) IMPLANT
SCISSORS LAP 5X35 DISP (ENDOMECHANICALS) ×3 IMPLANT
SEAL CANN UNIV 5-8 DVNC XI (MISCELLANEOUS) ×8 IMPLANT
SEAL XI 5MM-8MM UNIVERSAL (MISCELLANEOUS) ×4
SEALER VESSEL DA VINCI XI (MISCELLANEOUS) ×1
SEALER VESSEL EXT DVNC XI (MISCELLANEOUS) ×2 IMPLANT
SET BI-LUMEN FLTR TB AIRSEAL (TUBING) ×3 IMPLANT
SET TRI-LUMEN FLTR TB AIRSEAL (TUBING) ×3 IMPLANT
SOLUTION ELECTROLUBE (MISCELLANEOUS) ×3 IMPLANT
SPOGE SURGIFLO 8M (HEMOSTASIS) ×1
SPONGE GAUZE 2X2 STER 10/PKG (GAUZE/BANDAGES/DRESSINGS) ×1
SPONGE LAP 18X18 X RAY DECT (DISPOSABLE) IMPLANT
SPONGE LAP 4X18 X RAY DECT (DISPOSABLE) ×3 IMPLANT
SPONGE SURGIFLO 8M (HEMOSTASIS) ×2 IMPLANT
STAPLE ECHEON FLEX 60 POW ENDO (STAPLE) IMPLANT
STAPLER RELOAD WHITE 60MM (STAPLE)
SURGIFLO W/THROMBIN 8M KIT (HEMOSTASIS) ×3 IMPLANT
SUT ETHIBOND 0 36 GRN (SUTURE) IMPLANT
SUT ETHIBOND NAB CT1 #1 30IN (SUTURE) IMPLANT
SUT ETHILON 3 0 PS 1 (SUTURE) ×3 IMPLANT
SUT MNCRL AB 4-0 PS2 18 (SUTURE) ×9 IMPLANT
SUT PDS AB 1 CT1 27 (SUTURE) ×6 IMPLANT
SUT PROLENE 2 0 SH DA (SUTURE) ×3 IMPLANT
SUT V-LOC BARB 180 2/0GR6 GS22 (SUTURE) ×6
SUT VIC AB 0 CT1 27 (SUTURE) ×5
SUT VIC AB 0 CT1 27XBRD ANTBC (SUTURE) ×10 IMPLANT
SUT VIC AB 0 UR5 27 (SUTURE) ×3 IMPLANT
SUT VIC AB 2-0 SH 27 (SUTURE) ×2
SUT VIC AB 2-0 SH 27X BRD (SUTURE) ×4 IMPLANT
SUT VICRYL 0 UR6 27IN ABS (SUTURE) ×3 IMPLANT
SUT VLOC BARB 180 ABS3/0GR12 (SUTURE) ×3
SUTURE V-LC BRB 180 2/0GR6GS22 (SUTURE) ×4 IMPLANT
SUTURE VLOC BRB 180 ABS3/0GR12 (SUTURE) ×2 IMPLANT
SYR 20CC LL (SYRINGE) ×3 IMPLANT
SYRINGE 10CC LL (SYRINGE) ×3 IMPLANT
TAPE STRIPS DRAPE STRL (GAUZE/BANDAGES/DRESSINGS) IMPLANT
TIP INNERVISION DETACH 40FR (MISCELLANEOUS) IMPLANT
TIP INNERVISION DETACH 50FR (MISCELLANEOUS) IMPLANT
TIP INNERVISION DETACH 56FR (MISCELLANEOUS) IMPLANT
TIPS INNERVISION DETACH 40FR (MISCELLANEOUS)
TOWEL OR 17X26 10 PK STRL BLUE (TOWEL DISPOSABLE) ×6 IMPLANT
TOWEL OR NON WOVEN STRL DISP B (DISPOSABLE) ×6 IMPLANT
TRAY FOLEY W/METER SILVER 16FR (SET/KITS/TRAYS/PACK) ×3 IMPLANT
TRAY LAPAROSCOPIC (CUSTOM PROCEDURE TRAY) IMPLANT
TROCAR ADV FIXATION 5X100MM (TROCAR) ×3 IMPLANT
TROCAR BLADELESS OPT 5 100 (ENDOMECHANICALS) IMPLANT
TROCAR XCEL 12X100 BLDLESS (ENDOMECHANICALS) ×3 IMPLANT
WATER STERILE IRR 1500ML POUR (IV SOLUTION) IMPLANT

## 2015-10-17 NOTE — Op Note (Signed)
NAMEDAILEN, MILNE NO.:  [id number]  MEDICAL RECORD NO.:  [id number]  LOCATION:  77                         FACILITY:  [facility]  PHYSICIAN:  [name], MD     DATE OF BIRTH:  06-Feb-1941  DATE OF PROCEDURE: 10/17/2015                              OPERATIVE REPORT   DIAGNOSES: 1. Solid left renal mass and background of numerous left renal cyst     with mass effect. 2. Gastrointestinal mass.  PROCEDURES: 1. Robotic-assisted laparoscopic left partial nephrectomy. 2. Left robotic renal cyst decortication. 3. Intraoperative ultrasound with interpretation.  FIRS ASSISTANT:  Debbrah Alar, PA.  ESTIMATED BLOOD LOSS:  Less than 50 mL.  DRAINS:  Jackson-Pratt drain to bulb suction.  FINDINGS: 1. Numerous left renal cyst as anticipated, large dominant upper pole     cyst with mass effect and numerous small lower pole cyst. 2. Solid mass with some neovascularity in the lateral lower half of     the kidney, which between two simple cysts by intraoperative     ultrasound. 3. Single artery, single vein, left renovascular anatomy.  SPECIMENS: 1. Left partial nephrectomy. 2. Base of left partial nephrectomy. 3. Upper pole renal cyst wall. 4. Lower pole renal cyst wall #1. 5. Lower pole renal cyst wall #2. 6. Lower pole renal cyst wall #3. 7. Lower pole renal cyst wall #4. 8. Lower pole renal cyst wall #5 .  INDICATION:  Mr. Chiu is a very pleasant and very vigorous 75 year old gentleman, who was found to have left greater than right renal cyst, a small left enhancing renal mass and a solid GI mass.  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  He was evaluated by General Surgery team.  It was felt that surgical resection with robotic approach would be the most definitive means of management. We also evaluated the patient for his left renal mass and his cysts. 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  Options were discussed including surveillance versus ablative therapies versus surgical extirpation with and without minimally invasive assistance.  The patient adamantly wished to proceed with combined left robotic partial nephrectomy with cyst decortication at the same setting, as resection of his GI mass.  General Surgery team, Dr. Johney Maine and I both agreed this was safe and prudent and the patient wished to proceed.  Informed consent was obtained and placed in the medical record.  PROCEDURE IN DETAIL:  The patient being Wyatt Quattro, was verified. Procedure being left robotic partial nephrectomy was confirmed.  The GI portion of his procedure had already been performed today by Dr. Johney Maine, visualized, reports intraoperative findings inclusion to their case as well as positioning at that time, which was more of a head up position reports across the lower abdomen, left to right.  It was felt that complete repositioning left side up full flank would be most advantageous.  As such, port sites in the right hemiabdomen were closed by the General Surgery team and a closed suction drain was placed  on the right side with hemiabdomen, freely communicating the peritoneal cavity. A large Ioban was applied across the opened port site and he was completely repositioned in a left side up full flank position, applying 15 degrees of stable flexion, superior arm elevator, axillary roll, sequential compression devices, bottom leg bent, top leg straight. Beanbag was also used.  Foley catheter had already been placed previously and was functioning appropriately.  He was further fashioned to the operative table using 3-inch tape over foam padding across his supraxiphoid chest and his pelvis.  The Charlie Pitter was removed exposing the three remaining in situ port sites on the left  hemiabdomen and a new sterile field was created by prepping and draping the patient's entire left abdomen and flank using chlorhexidine gluconate.  Pneumoperitoneum was re-established by cannulating the prior port site that was approximately 2 fingerbreadths superolateral to the umbilicus, the robotic camera port and insufflating.  Laparoscopic vision revealed this would be a suitable position for the camera port.  However, the other left-sided previous port sites would not be suitable for reuse.  As such, additional new ports were then placed as follows:  Left subcostal 8-mm robotic port, left far lateral 8-mm robotic port 4 fingerbreadths superior-medial to the anterior superior iliac spine, left paramedian inferior robotic port approximately 1 handbreadth superior to the pubic ramus and two 12-mm assistant port sites in the midline, lower one in the supraumbilical crease and upper one approximately 2 fingerbreadths superior to the camera port and being an AirSeal-type port.  Robot was docked and passed through the electronic checks.  Initial attention was directed at development of the retroperitoneum.  Incision was made lateral to the descending colon for the area of splenic flexure towards the area of the internal ring and the colon was carefully swept medially.  There was some mild desmoplastic reaction around the surface of Gerota's fascia and cysts likely consistent with prior cyst infection, but no active inflammation was seen.  Lower pole of the kidney was identified, placed on gentle lateral traction.  Dissection proceeded medial to this.  The ureter and gonadal vessels were encountered, placed on gentle lateral traction, dissection proceeded within this triangle superiorly towards the area of the renal hilum. The renal hilum consisted of single artery, single vein, renovascular anatomy as anticipated.  The artery and vein were each circumferentially mobilized and marked with  vessel loop.  The attention was then directed at cyst decortication.  The upper pole cyst was easily identified and decorticated approximately half of its circumference, had straw-colored fluid.  Cyst wall was set aside, labeled as upper pole cyst wall.  Three extreme lower pole cysts were also identified and decorticated similarly and set aside as a separate pathologic specimen, the cyst walls; #1, 2 and 3 inferior on the more mid inferolateral aspect of the kidney, there were two dominant cysts with what appeared to be solid nodule between that would be consistent with the patient's prior mass.  Intraoperative ultrasound was then performed.  Intraoperative ultrasound was then performed using drop-in probe.  This did confirm a solid cortical mass with neovascularity on a stalk, that was wedged between two cysts.  The prior MRI was consulted and this was felt to confirm the area of the mass.  Given the stalk was relatively small, I felt that a non-clamping technique would be safest as the patient does have some baseline renal dysfunction.    The two additional cyst that bordered the mass were decorticated with their wall sent  separately and partial nephrectomy was performed using off-clamp technique keeping what appeared to be a normal rim of tissue with the base of the partial nephrectomy specimen, which was placed into an EndoCatch bag for later retrieval.  A representative separate base of margin was set aside for permanent pathology.  Point coagulation current provided good hemostasis; however, a single running renorrhaphy layer of 3-0 V-loc was used, which provided excellent hemostasis, sandwiched between Hem-o-Loks and lapper ties.  A strip of perirenal fat was lied in apposition to the partial nephrectomy specimen and lateral cyst wall cavities after and FloSeal was then applied to these and the fat was then sutured in place using Vicryl.  The kidney appeared to be sufficiently  anchors, such that nephropexy was not warranted.  Vessel loops were removed.  The ureter was inspected and uninjured.  At this point, all sponge and needle counts were correct.  We achieved goals of procedure today.  Robot was then undocked.  These specimens were retrieved by extending the previous lower assistant port site superiorly for total distance approximately 2.5 cm and removing the partial nephrectomy specimen, setting it aside for permanent pathology as well as the duodenal mass separately.  The upper assistant port site was closed at the level of fascia using Carter-Thomason suture passer under laparoscopic vision and the lower assistant port site, which again, the extraction port site was closed at the level of fascia using figure-of- eight PDS x2 followed by reapproximation of Scarpa's with running Vicryl.  All incision sites were infiltrated with dilute lyophilized Marcaine and closed at the level of the skin using subcuticular Monocryl followed by Dermabond and procedure was terminated.  The patient tolerated the procedure well.  There were no immediate periprocedural complications.  The patient was taken to the postanesthesia care unit in stable condition.          ______________________________ [name], MD     TM/MEDQ  D:  10/17/2015  T:  10/17/2015  Job:  [id number]

## 2015-10-17 NOTE — Brief Op Note (Signed)
10/17/2015  2:06 PM  PATIENT:  Mike Roach  75 y.o. male  PRE-OPERATIVE DIAGNOSIS: , LEFT RENAL MASS, Numerous Left Renal Cysts  POST-OPERATIVE DIAGNOSIS:   LEFT RENAL MASS, Numerous Left Renal Cysts  PROCEDURE:  Left robotic partial nephrectomy, Left renal cyst decortication, Intraoperative Ultrasound with interpretation  SURGEON:  Surgeon(s) and Role: Panel 1:       * [name], MD - Primary  PHYSICIAN ASSISTANT:    ASSISTANTS: Clemetine Marker, PA   ANESTHESIA:   general  EBL:  Total I/O In: 3000 [I.V.:3000] Out: 425 [Urine:375; Blood:50]  BLOOD ADMINISTERED:none  DRAINS: JP to bulb   LOCAL MEDICATIONS USED:  MARCAINE     SPECIMEN:  Source of Specimen:  1 - left renal mass, 2 - Left upper pole cyst wall, 3 - Left lower polce cyst wall #1, #2, #3, #4, #5 4 - Left Renal Mass deep margin  DISPOSITION OF SPECIMEN:  PATHOLOGY  COUNTS:  YES  TOURNIQUET:  * No tourniquets in log *  DICTATION: .Other Dictation: Dictation Number [id number]  PLAN OF CARE: Admit to inpatient   PATIENT DISPOSITION:  PACU - hemodynamically stable.   Delay start of Pharmacological VTE agent (>24hrs) due to surgical blood loss or risk of bleeding: yes

## 2015-10-17 NOTE — Anesthesia Procedure Notes (Signed)
Procedure Name: Intubation Date/Time: 10/17/2015 8:40 AM Performed by: Glory Buff Pre-anesthesia Checklist: Patient identified, Emergency Drugs available, Suction available and Patient being monitored Patient Re-evaluated:Patient Re-evaluated prior to inductionOxygen Delivery Method: Circle system utilized Preoxygenation: Pre-oxygenation with 100% oxygen Intubation Type: IV induction Ventilation: Mask ventilation without difficulty Laryngoscope Size: Miller and 3 Grade View: Grade I Tube type: Oral Tube size: 8.0 mm Number of attempts: 1 Airway Equipment and Method: Stylet and Oral airway Placement Confirmation: ETT inserted through vocal cords under direct vision,  positive ETCO2 and breath sounds checked- equal and bilateral Secured at: 22 cm Tube secured with: Tape Dental Injury: Teeth and Oropharynx as per pre-operative assessment

## 2015-10-17 NOTE — Interval H&P Note (Signed)
History and Physical Interval Note:  10/17/2015 8:31 AM  Mike Roach  has presented today for surgery, with the diagnosis of resection of duodenal mass, LEFT RENAL MASS  The various methods of treatment have been discussed with the patient and family. After consideration of risks, benefits and other options for treatment, the patient has consented to  Procedure(s): XI ROBOTIC REPAIR PARAESOPHAGEAL HIATAL HERNIA WITH  FUNDOPLICATION (N/A) XI ROBOTIC ASSITED LEFT PARTIAL NEPHRECTOMY (Left) as a surgical intervention .  The patient's history has been reviewed, patient examined, no change in status, stable for surgery.  I have reviewed the patient's chart and labs.  Questions were answered to the patient's satisfaction.     [name] C.

## 2015-10-17 NOTE — H&P (Signed)
Mike Roach is an 75 y.o. male.    Chief Complaint: pre-op LEFT partial nephrectomy   HPI:   1 - Solid Left Renal Mass - 1.9cm LLP enhancing mass by CT and dedicated MRI 06/2015 as part of duodenal tumor eval. Mass in background of numerous cysts as per below and actually appears to be siting directly on two confluent cortical cysts at deep edge. 1 artery / 1 vein left renovascular anatomy.   He is planning to have combined Urol / General Surgery partial nephrectomy and GIST excision on 10/17/2015 by myself and Dr. Johney Maine.    2 - Bilateral Renal Cysts - numerous bilateral mostly simple cysts, most <2cm. Dominant LUP 6cm simple cyst with some mass effect. RUP 2cm hemorragic cyst that appeared solid on prior CT, but dedicated MRI confirms hemoragic.    3 - Elevated PSA - Long h/o elevated PSA with negative BX x 3 previously mst recently 2006 at time TRUS 40mL. PSA peak of 14.    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 Alvy Bimler. Resection pending as per above.    PMH sig for solitary testis, knee arthroscopy. No CV disease. NO blood thinners. No prior chest / abd surgery. His PCP is Dr Matthias Hughs. 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    Past Medical History:  Diagnosis Date  . Adenomatous colon polyp 09/21/2014  . Anemia in chronic illness 03/07/2015   being evaluated  . Arthritis   . Bilateral hearing loss 03/15/2015  . Chronic kidney disease   . Chronic kidney disease (CKD) 03/07/2015  . GERD (gastroesophageal reflux disease)   . Glaucoma   . Hypercholesterolemia   . Hypertension   . Iron deficiency anemia 03/15/2015  . Prostate enlargement   . Skin lesion of left leg 03/07/2015    Past Surgical  History:  Procedure Laterality Date  . colonoscopy    . EUS N/A 07/05/2015   Procedure: UPPER ENDOSCOPIC ULTRASOUND (EUS) LINEAR;  Surgeon: Milus Banister, MD;  Location: [facility];  Service: Endoscopy;  Laterality: N/A;  . KNEE ARTHROSCOPY WITH LATERAL MENISECTOMY Right 01/21/2013   Procedure: KNEE ARTHROSCOPY WITH PARTIAL LATERAL MENISECTOMY;  Surgeon: Carole Civil, MD;  Location: [facility];  Service: Orthopedics;  Laterality: Right;  . ORIF ANKLE DISLOCATION Right     History reviewed. No pertinent family history. Social History:  reports that he quit smoking about 34 years ago. His smoking use included Cigarettes. He has a 20.00 pack-year smoking history. He has never used smokeless tobacco. He reports that he does not drink alcohol or use drugs.  Allergies: No Known Allergies  Medications Prior to Admission  Medication Sig Dispense Refill  . ARTIFICIAL TEAR OINTMENT OP Place 1 application into both eyes daily as needed (For dry eyes.).    Marland Kitchen aspirin EC 81 MG tablet Take 81 mg by mouth daily.    . dorzolamide (TRUSOPT) 2 % ophthalmic solution Place 1 drop into both eyes 2 (two) times daily.     . ferrous sulfate 325 (65 FE) MG tablet Take 325 mg by mouth daily with breakfast.    . finasteride (PROSCAR) 5 MG tablet Take 5 mg by mouth daily.  3  . lisinopril (PRINIVIL,ZESTRIL) 20  MG tablet Take 20 mg by mouth every morning.     . Multiple Vitamins-Minerals (MULTIVITAMINS THER. W/MINERALS) TABS tablet Take 1 tablet by mouth daily.    . Omega-3 Fatty Acids (FISH OIL PO) Take 4 capsules by mouth daily.    Marland Kitchen omeprazole (PRILOSEC) 20 MG capsule Take 20 mg by mouth daily.    . simvastatin (ZOCOR) 20 MG tablet Take 20 mg by mouth at bedtime.     . travoprost, benzalkonium, (TRAVATAN) 0.004 % ophthalmic solution Place 1 drop into both eyes at bedtime.       No results found for this or any previous visit (from the past 48 hour(s)). No results found.  Review of Systems  Constitutional:  Negative.  Negative for chills, fever and malaise/fatigue.  HENT: Negative.   Eyes: Negative.   Respiratory: Negative.   Cardiovascular: Negative.   Gastrointestinal: Negative.   Genitourinary: Negative.   Musculoskeletal: Negative.   Skin: Negative.   Neurological: Negative.   Endo/Heme/Allergies: Negative.   Psychiatric/Behavioral: Negative.     Blood pressure (!) 143/82, pulse (!) 55, temperature 97.8 F (36.6 C), temperature source Oral, resp. rate 16, height 6\' 2"  (1.88 m), weight 94.3 kg (208 lb), SpO2 100 %. Physical Exam  Constitutional: He is oriented to person, place, and time. He appears well-developed.  HENT:  Head: Normocephalic.  Eyes: Pupils are equal, round, and reactive to light.  Neck: Normal range of motion.  Cardiovascular: Normal rate.   Respiratory: Effort normal.  GI: Soft.  Genitourinary:  Genitourinary Comments: No CVAT  Musculoskeletal: Normal range of motion.  Neurological: He is alert and oriented to person, place, and time.  Skin: Skin is warm.  Psychiatric: He has a normal mood and affect. His behavior is normal. Judgment and thought content normal.     Assessment/Plan  Rediscussed risks, benefits, and typical peri-op course with partial nephrectomy. I again feel is safe to combine with GIST surgery as either surgery has high potential of causing adhesions that would make staged approach potentially more complicated. Our primary focus is solid left renal mass, but will also perform decortication of ipsilateral dominant cyst if anatomy favorable as there is some mass effect which could worsen.   He has good understanding and desires to proceed as planned.     [name], MD 10/17/2015, 7:08 AM

## 2015-10-17 NOTE — Anesthesia Postprocedure Evaluation (Signed)
Anesthesia Post Note  Patient: Mike Roach  Procedure(s) Performed: Procedure(s) (LRB): XI ROBOTIC Duodenal GIST Tumor Resection (N/A) XI ROBOTIC ASSITED LEFT PARTIAL NEPHRECTOMY, Left Cyst Decortication, Left Renal Ultrasound (Left)  Patient location during evaluation: PACU Anesthesia Type: General Level of consciousness: awake and alert and patient cooperative Pain management: pain level controlled Vital Signs Assessment: post-procedure vital signs reviewed and stable Respiratory status: spontaneous breathing and respiratory function stable Cardiovascular status: stable Anesthetic complications: no    Last Vitals:  Vitals:   10/17/15 1619 10/17/15 1727  BP: (!) 145/88 140/81  Pulse: 66 77  Resp: 16 15  Temp: 36.7 C 36.6 C    Last Pain:  Vitals:   10/17/15 1727  TempSrc: Oral                 Arin Vanosdol S

## 2015-10-17 NOTE — Op Note (Addendum)
10/17/2015  11:37 AM  PATIENT:  Mike Roach  75 y.o. male  Patient Care Team: Zella Richer. 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   PROCEDURE:    Robotically assisted partial duodenal resection to remove duodenal wall GIST tumor Omentopexy  Surgeon: [name], MD  ASSISTANT:  Arta Bruce Kinsinger, MD  ANESTHESIA:   local and general  EBL:  Total I/O In: 2000 [I.V.:2000] Out: 150 [Urine:150]  Delay start of Pharmacological VTE agent (>24hrs) due to surgical blood loss or risk of bleeding:  no  DRAINS: (19Fr Blake drain in the RUQ over the duodenal closure & omentopexy)   SPECIMEN:  Source of Specimen:  Duodenal wall mass  DISPOSITION OF SPECIMEN:  PATHOLOGY  COUNTS:  YES  PLAN OF CARE: Admit to inpatient   PATIENT DISPOSITION:  Operating room.  Intubated and stable.  Dr. Tresa Moore to assume surgical care for his partial nephrectomy  INDICATION: Pleasant male found to be anemic.  Underwent workup.  Negative colonoscopy.  Found to have protruding mass in the mid duodenum.  Biopsy consistent with gastrointestinal stromal tumor.  Not associated with the ampulla.  Recommended resection of the mass with duodenal wall and most likely primary closure.  The anatomy & physiology of the foregut and anti-reflux mechanism was discussed.  The pathophysiology of Gastrointestinal stromal tumors (GISTs) and differential diagnosis was discussed.  Natural history risks without surgery was discussed.   The patient's symptoms are not adequately controlled by medicines and other non-operative treatments.  I feel the risks of no intervention will lead to  serious problems that outweigh the operative risks; therefore, I recommended surgery to remove the mass.  Need for a thorough workup to rule out the differential diagnosis and plan treatment was explained.  I explained robotic &  laparoscopic techniques with possible need for an open approach.  I will need to remove the mass en bloc.  Therefore I will need extraction incision.  Possible hand assist port as well.  Risks such as bleeding, infection, abscess, leak, injury to other organs, need for repair of tissues / organs, need for further treatment, heart attack, death, and other risks were discussed.   I noted a good likelihood this will help address the problem.  Goals of post-operative recovery were discussed as well.  Possibility that this will not correct all symptoms was explained.  Post-operative dysphagia, need for short-term liquid & pureed diet, possible need for medicines to help control symptoms in addition to surgery were discussed.  We will work to minimize complications.  Possible need for Gleevec our Sutent postoperatively depending on the aggressiveness of the tumor was discussed as well.  That would involve medical oncology consultation.  Educational handouts further explaining the pathology, treatment options was given as well.  Questions were answered.  The patient expressed understanding & wished to proceed with surgery.  OR FINDINGS: Patient had a bulky duodenal wall mass 4 x 4 centimeters.    Location: the second parti of the duodenum proximal to the ampulla.  Primarily on the anterolateral side wall.  Primary closure tranverse over a nasogastric tube using 2-0 V Llock suture in a running [place] fashion.  Omentopexy on top of that as well.  DESCRIPTION:  Informed consent was confirmed.  Patient underwent Josie difficulty.  He was positioned supine with the bump on his right flank.  Abdomen was prepped and draped in a sterile fashion.  I placed a five [place] in the left upper  quadrant using a very static technique using a trach hook to help hold the fascia for countertraction.  His carbon dioxide insufflation.  Place port.  Entry was clean.  I placed additional robotic ports and an assist port under direct sensation.  Placed a Nathanson liver retractor and epigastric region to help lift the liver and gallbladder anteriorly and away from the antrum and duodenal sweep. Robot was docked carefully.  I mobilized the transverse colon in a superior to inferior fashion getting into the lesser sac and continuing more proximally towards the hepatic flexure.  With that I could better expose the duodenal sweep.  We began to mobilize the duodenal sweep and a lateral to medial fashion and the classic Programmer, applications.  With that, we encountered a protuberant firm mass within the second portion of the duodenum.  I was able to mobilize the duodenum well.  The mass involved about 80% of the antimesenteric border.  It was proximal to the ampulla.  It was anterolateral which was good.  Immediate and full flexion excision of the duodenal wall around the mass with 5 mm margins.  I did this  using scissors coming around circumferentially.  It was densely larger the original 2.5 cm estimate by ultrasound a few months ago I placed the specimen inside and a pouch bag.  This was later removed by Dr. Tresa Moore when he extracted the kidney tumor.  Tissues were viable.  Hemostasis was good.  The ampulla was not nearby.  Could see it more distally several centimeters.  We advanced the nasogastric true along the lesser curvature and brought it across duodenal wound, the tip going down towards the third portion.   I primarily closed this duodenal wound transversely using a running locking 2-0 V-lock  suture.  I started from each corner and met in the center.  This help close it, imbricating quite well.  We did an air leak test by placing the nasogastric tube with oxygen insufflation with the duodenum clamped distally.  It  inflated well with no leak of gas, implying an airtight seal.  Then had some greater omentum that I used to tack to the upper retroperitoneum and over the the anastomosis  for a good omentopexy of the anastomosis.  We did irrigation.  Tissues looked healthy.  Placed a drain as noted above.  Remove the right-sided ports and liver retractor.  Right-sided port sites are closed with Monocryl suture.  Left side covered in anticipation of urology using them further robotic nephrectomy.  Case was turned over to them.  Please see Dr. Tresa Moore for his OR note.  I discussed operative findings, updated the patient's status, discussed probable steps to recovery, and gave postoperative recommendations to the Patient's ex-wife upon his request.  Recommendations were made.  Questions were answered.  She expressed understanding & appreciation.   Adin Hector, M.D., F.A.C.S. Gastrointestinal and Minimally Invasive Surgery [facility] [address] [place], [place] [address] [phone] Main / Paging

## 2015-10-17 NOTE — H&P (Signed)
Mike Roach 08/06/2015 8:48 AM Location: [facility] Patient #: [id number] DOB: [birth year] Single / Language: Mike Roach / Race: Black or African American Male   History of Present Illness Patient words: Stomach/kidney tumor.   Patient Care Team: Mike Roach. Mike Court, Roach as PCP - General (Family Medicine) Mike Boston, Roach as Consulting Physician (General Surgery) Mike Frock, Roach as Consulting Physician (Urology) Mike Mayer, Roach as Consulting Physician (Gastroenterology) Mike Lark, Roach as Consulting Physician (Hematology and Oncology)      The patient is a 75 year old male who presents with a gastrointestinal foreign body. Note for "Gastrointestinal foreign body": Patient sent for surgical consultation by Dr. Phebe Colla with [facility]. 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 Request combined excision at the time of partial nephrectomy.  Pleasant active gentleman. 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 Underwent upper and lower endoscopy. Massive bulging at upper duodenum. Biopsies benign but concerning. Endorectal ultrasound done showed duodenal wall mass. Biopsies consistent with spindle cell neoplasm. Surgical consultation recommended. Patient also with left kidney mass. Concerning for possible renal cell cancer. Saw urology. Recommendation consider partial nephrectomy. Seen by hematology for anemia. Discussion made about coordination of surgeries.  Patient comes in today by himself. He's never abdominal surgery. He can walk a half hour without difficulty. He does not smoke. Usually has a bowel movement every day. He is convinced the iron gives him diarrhea when he takes it in the morning. Has some reflux controlled on omeprazole. On just maybe aspirin. No personal nor family history of GI/colon cancer, inflammatory bowel disease, irritable bowel syndrome, allergy such as Celiac Sprue, dietary/dairy problems, colitis, ulcers nor  gastritis. No recent sick contacts/gastroenteritis. No travel outside the country. No changes in diet. No dysphagia to solids or liquids. No hematochezia, hematemesis, coffee ground emesis. No evidence of prior gastric/peptic ulceration.   Other Problems Mike Roach, CMA; 08/06/2015 8:48 AM) Arthritis Enlarged Prostate Hemorrhoids High blood pressure  Past Surgical History Mike Roach, [place]; 08/06/2015 8:48 AM) Knee Surgery Right.  Diagnostic Studies History Mike Roach, [place]; 08/06/2015 8:48 AM) Colonoscopy 1-5 years ago  Allergies Mike Roach, [place]; 08/06/2015 8:49 AM) No Known Drug Allergies06/07/2015  Medication History Mike Roach, CMA; 08/06/2015 8:51 AM) Dorzolamide HCl-Timolol Mal (22.3-6.8MG /ML Solution, Ophthalmic) Active. Finasteride (5MG  Tablet, Oral) Active. Lisinopril (20MG  Tablet, Oral) Active. Omeprazole (20MG  Capsule DR, Oral) Active. Simvastatin (20MG  Tablet, Oral) Active. Artificial Tears (0.5-1.4) (Ophthalmic) Active. Aspirin (81MG  Tablet DR, Oral) Active. Multivitamin Adult (Oral) Active. Omega 3 (1000MG  Capsule, Oral) Active. Medications Reconciled  Social History Mike Roach; 08/06/2015 9:35 AM) Caffeine use Coffee, Tea. No drug use Tobacco use Former smoker. Semiretired Engineer, structural with [employer]  Family History Mike Roach, [place]; 08/06/2015 8:48 AM) Arthritis Father, Mother. Diabetes Mellitus Brother. Heart Disease Brother. Heart disease in male family member before age 69 Hypertension Brother.    Review of Systems Mike Roach CMA; 08/06/2015 8:48 AM) General Present- Weight Loss. Not Present- Appetite Loss, Chills, Fatigue, Fever, Night Sweats and Weight Gain. HEENT Present- Hearing Loss. Not Present- Earache, Hoarseness, Nose Bleed, Oral Ulcers, Ringing in the Ears, Seasonal Allergies, Sinus Pain, Sore Throat, Visual Disturbances, Wears glasses/contact lenses and Yellow Eyes. Cardiovascular  Not Present- Chest Pain, Difficulty Breathing Lying Down, Leg Cramps, Palpitations, Rapid Heart Rate, Shortness of Breath and Swelling of Extremities. Gastrointestinal Present- Hemorrhoids. Not Present- Abdominal Pain, Bloating, Bloody Stool, Change in Bowel Habits, Chronic diarrhea, Constipation, Difficulty Swallowing, Excessive gas, Gets full quickly at meals, Indigestion, Nausea, Rectal Pain and  Vomiting.  Vitals Mike Roach CMA; 08/06/2015 8:51 AM) 08/06/2015 8:51 AM Weight: 212.2 lb Height: 74.5in Height was reported by patient. Body Surface Area: 2.24 m Body Mass Index: 26.88 kg/m  Temp.: 98.33F  Pulse: 56 (Regular)  BP: 120/64 (Sitting, Left Arm, Standard)       Physical Exam Mike Hector Roach; 08/06/2015 9:37 AM) General Mental Status-Alert. General Appearance-Not in acute distress, Not Sickly. Orientation-Oriented X3. Hydration-Well hydrated. Voice-Normal.  Integumentary Global Assessment Upon inspection and palpation of skin surfaces of the - Axillae: non-tender, no inflammation or ulceration, no drainage. and Distribution of scalp and body hair is normal. General Characteristics Temperature - normal warmth is noted.  Head and Neck Head-normocephalic, atraumatic with no lesions or palpable masses. Face Global Assessment - atraumatic, no absence of expression. Neck Global Assessment - no abnormal movements, no bruit auscultated on the right, no bruit auscultated on the left, no decreased range of motion, non-tender. Trachea-midline. Thyroid Gland Characteristics - non-tender.  Eye Eyeball - Left-Extraocular movements intact, No Nystagmus. Eyeball - Right-Extraocular movements intact, No Nystagmus. Cornea - Left-No Hazy. Cornea - Right-No Hazy. Sclera/Conjunctiva - Left-No scleral icterus, No Discharge. Sclera/Conjunctiva - Right-No scleral icterus, No Discharge. Pupil - Left-Direct reaction to light normal. Pupil -  Right-Direct reaction to light normal.  ENMT Ears Pinna - Left - no drainage observed, no generalized tenderness observed. Right - no drainage observed, no generalized tenderness observed. Nose and Sinuses External Inspection of the Nose - no destructive lesion observed. Inspection of the nares - Left - quiet respiration. Right - quiet respiration. Mouth and Throat Lips - Upper Lip - no fissures observed, no pallor noted. Lower Lip - no fissures observed, no pallor noted. Nasopharynx - no discharge present. Oral Cavity/Oropharynx - Tongue - no dryness observed. Oral Mucosa - no cyanosis observed. Hypopharynx - no evidence of airway distress observed.  Chest and Lung Exam Inspection Movements - Normal and Symmetrical. Accessory muscles - No use of accessory muscles in breathing. Palpation Palpation of the chest reveals - Non-tender. Auscultation Breath sounds - Normal and Clear.  Cardiovascular Auscultation Rhythm - Regular. Murmurs & Other Heart Sounds - Auscultation of the heart reveals - No Murmurs and No Systolic Clicks.  Abdomen Inspection Inspection of the abdomen reveals - No Visible peristalsis and No Abnormal pulsations. Umbilicus - No Bleeding, No Urine drainage. Palpation/Percussion Palpation and Percussion of the abdomen reveal - Soft, Non Tender, No Rebound tenderness, No Rigidity (guarding) and No Cutaneous hyperesthesia. Note: Abdomen soft. Nontender, nondistended. No guarding. No umbilical no other hernias   Male Genitourinary Sexual Maturity Tanner 5 - Adult hair pattern and Adult penile size and shape. Note: No inguinal hernias. Normal external genitalia. Epididymi, testes, and spermatic cords normal without any masses.   Peripheral Vascular Upper Extremity Inspection - Left - No Cyanotic nailbeds, Not Ischemic. Right - No Cyanotic nailbeds, Not Ischemic.  Neurologic Neurologic evaluation reveals -normal attention span and ability to concentrate, able  to name objects and repeat phrases. Appropriate fund of knowledge , normal sensation and normal coordination. Mental Status Affect - not angry, not paranoid. Cranial Nerves-Normal Bilaterally. Gait-Normal.  Neuropsychiatric Mental status exam performed with findings of-able to articulate well with normal speech/language, rate, volume and coherence, thought content normal with ability to perform basic computations and apply abstract reasoning and no evidence of hallucinations, delusions, obsessions or homicidal/suicidal ideation.  Musculoskeletal Global Assessment Spine, Ribs and Pelvis - no instability, subluxation or laxity. Right Upper Extremity - no instability, subluxation or laxity.  Lymphatic Head & Neck  General Head & Neck Lymphatics: Bilateral - Description - No Localized lymphadenopathy. Axillary  General Axillary Region: Bilateral - Description - No Localized lymphadenopathy. Femoral & Inguinal  Generalized Femoral & Inguinal Lymphatics: Left - Description - No Localized lymphadenopathy. Right - Description - No Localized lymphadenopathy.  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 Second portion antimesenteric near level of the ampulla.  I think he would benefit from excision before this becomes more problematic. Reasonable to do wide excision and closure. Robotic approach. Probably will require endoscopic evaluation for identification of proper location. I will work to try and get good margins without affecting the ampulla.  It is reasonable to coordinate at the same as the partial left nephrectomy for the probable renal cell cancer/solid renal mass. I think would be best if I went first to get rid of the tumor and then they  can concentrate on the partial nephrectomy. Would have intraoperative ultrasound available. Would have EGD upper endoscopy available. Help localize the duodenal tumor.  Spent some time going over the anatomy and physiology. Pictures. Patient is still trying to wrap his had around this. He wishes to proceed while there are small before they become more serious of a problem. His male friend agrees. We will work for a convenient time. The patient's hoping to wait until after mid-July when his training courses over. I think that is reasonable.   Current Plans You are being scheduled for surgery - Our schedulers will call you.  You should hear from our office's scheduling department within 5 working days about the location, date, and time of surgery. We try to make accommodations for patient's preferences in scheduling surgery, but sometimes the OR schedule or the surgeon's schedule prevents [place] from making those accommodations.  If you have not heard from our office [phone]) in 5 working days, call the office and ask for your surgeon's nurse.  If you have other questions about your diagnosis, plan, or surgery, call the office and ask for your surgeon's nurse.  Pt Education - [facility] Free Text Education/Instructions: discussed with patient and provided information. Pt Education - [facility] Laparosopic Post Op HCI (Sigfredo Schreier) Pt Education - [facility] Good Bowel Health (Hasel Janish) Pt Education - Education: Pathology Report given to patient  Mike Roach, M.D., F.A.C.S. Gastrointestinal and Minimally Invasive Surgery [facility] [address], [place], [place] [address] [phone] Main / Paging

## 2015-10-17 NOTE — Anesthesia Preprocedure Evaluation (Addendum)
Anesthesia Evaluation  Patient identified by MRN, date of birth, ID band Patient awake    Reviewed: Allergy & Precautions, H&P , NPO status , Patient's Chart, lab work & pertinent test results  Airway Mallampati: II   Neck ROM: full    Dental   Pulmonary former smoker,    breath sounds clear to auscultation       Cardiovascular hypertension,  Rhythm:regular Rate:Normal     Neuro/Psych    GI/Hepatic GERD  ,  Endo/Other    Renal/GU Renal InsufficiencyRenal disease     Musculoskeletal  (+) Arthritis ,   Abdominal   Peds  Hematology   Anesthesia Other Findings   Reproductive/Obstetrics                             Anesthesia Physical Anesthesia Plan  ASA: III  Anesthesia Plan: General   Post-op Pain Management:    Induction: Intravenous  Airway Management Planned: Oral ETT  Additional Equipment:   Intra-op Plan:   Post-operative Plan: Extubation in OR  Informed Consent: I have reviewed the patients History and Physical, chart, labs and discussed the procedure including the risks, benefits and alternatives for the proposed anesthesia with the patient or authorized representative who has indicated his/her understanding and acceptance.     Plan Discussed with: CRNA, Anesthesiologist and Surgeon  Anesthesia Plan Comments:         Anesthesia Quick Evaluation

## 2015-10-17 NOTE — Transfer of Care (Signed)
Immediate Anesthesia Transfer of Care Note  Patient: Iokepa Liscio  Procedure(s) Performed: Procedure(s): XI ROBOTIC Duodenal GIST Tumor Resection (N/A) XI ROBOTIC ASSITED LEFT PARTIAL NEPHRECTOMY, Left Cyst Decortication, Left Renal Ultrasound (Left)  Patient Location: PACU  Anesthesia Type:General  Level of Consciousness: awake, alert  and oriented  Airway & Oxygen Therapy: Patient Spontanous Breathing and Patient connected to face mask oxygen  Post-op Assessment: Report given to RN and Post -op Vital signs reviewed and stable  Post vital signs: Reviewed and stable  Last Vitals:  Vitals:   10/17/15 0600  BP: (!) 143/82  Pulse: (!) 55  Resp: 16  Temp: 36.6 C    Last Pain:  Vitals:   10/17/15 0600  TempSrc: Oral      Patients Stated Pain Goal: 4 ([id number])  Complications: No apparent anesthesia complications

## 2015-10-17 NOTE — Discharge Instructions (Signed)
SURGERY: POST OP INSTRUCTIONS °(Surgery for small bowel obstruction, colon resection, etc) ° ° °###################################################################### ° °EAT °Gradually transition to a high fiber diet with a fiber supplement over the next few days after discharge ° °WALK °Walk an hour a day.  Control your pain to do that.   ° °CONTROL PAIN °Control pain so that you can walk, sleep, tolerate sneezing/coughing, go up/down stairs. ° °HAVE A BOWEL MOVEMENT DAILY °Keep your bowels regular to avoid problems.  OK to try a laxative to override constipation.  OK to use an antidairrheal to slow down diarrhea.  Call if not better after 2 tries ° °CALL IF YOU HAVE PROBLEMS/CONCERNS °Call if you are still struggling despite following these instructions. °Call if you have concerns not answered by these instructions ° °###################################################################### ° ° °DIET °Follow a light diet the first few days at home.  Start with a bland diet such as soups, liquids, starchy foods, low fat foods, etc.  If you feel full, bloated, or constipated, stay on a ful liquid or pureed/blenderized diet for a few days until you feel better and no longer constipated. °Be sure to drink plenty of fluids every day to avoid getting dehydrated (feeling dizzy, not urinating, etc.). °Gradually add a fiber supplement to your diet over the next week.  Gradually get back to a regular solid diet.  Avoid fast food or heavy meals the first week as you are more likely to get nauseated. °It is expected for your digestive tract to need a few months to get back to normal.  It is common for your bowel movements and stools to be irregular.  You will have occasional bloating and cramping that should eventually fade away.  Until you are eating solid food normally, off all pain medications, and back to regular activities; your bowels will not be normal. °Focus on eating a low-fat, high fiber diet the rest of your life  (See Getting to Good Bowel Health, below). ° °CARE of your INCISION or WOUND °It is good for closed incision and even open wounds to be washed every day.  Shower every day.  Short baths are fine.  Wash the incisions and wounds clean with soap & water.    °If you have a closed incision(s), wash the incision with soap & water every day.  You may leave closed incisions open to air if it is dry.   You may cover the incision with clean gauze & replace it after your daily shower for comfort. °If you have skin tapes (Steristrips) or skin glue (Dermabond) on your incision, leave them in place.  They will fall off on their own like a scab.  You may trim any edges that curl up with clean scissors.  If you have staples, set up an appointment for them to be removed in the office in 10 days after surgery.  °If you have a drain, wash around the skin exit site with soap & water and place a new dressing of gauze or band aid around the skin every day.  Keep the drain site clean & dry.    °If you have an open wound with packing, see wound care instructions.  In general, it is encouraged that you remove your dressing and packing, shower with soap & water, and replace your dressing once a day.  Pack the wound with clean gauze moistened with normal (0.9%) saline to keep the wound moist & uninfected.  Pressure on the dressing for 30 minutes will stop most wound   bleeding.  Eventually your body will heal & pull the open wound closed over the next few months.  °Raw open wounds will occasionally bleed or secrete yellow drainage until it heals closed.  Drain sites will drain a little until the drain is removed.  Even closed incisions can have mild bleeding or drainage the first few days until the skin edges scab over & seal.   °If you have an open wound with a wound vac, see wound vac care instructions. ° ° ° ° °ACTIVITIES as tolerated °Start light daily activities --- self-care, walking, climbing stairs-- beginning the day after surgery.   Gradually increase activities as tolerated.  Control your pain to be active.  Stop when you are tired.  Ideally, walk several times a day, eventually an hour a day.   °Most people are back to most day-to-day activities in a few weeks.  It takes 4-8 weeks to get back to unrestricted, intense activity. °If you can walk 30 minutes without difficulty, it is safe to try more intense activity such as jogging, treadmill, bicycling, low-impact aerobics, swimming, etc. °Save the most intensive and strenuous activity for last (Usually 4-8 weeks after surgery) such as sit-ups, heavy lifting, contact sports, etc.  Refrain from any intense heavy lifting or straining until you are off narcotics for pain control.  You will have off days, but things should improve week-by-week. °DO NOT PUSH THROUGH PAIN.  Let pain be your guide: If it hurts to do something, don't do it.  Pain is your body warning you to avoid that activity for another week until the pain goes down. °You may drive when you are no longer taking narcotic prescription pain medication, you can comfortably wear a seatbelt, and you can safely make sudden turns/stops to protect yourself without hesitating due to pain. °You may have sexual intercourse when it is comfortable. If it hurts to do something, stop. ° °MEDICATIONS °Take your usually prescribed home medications unless otherwise directed.   °Blood thinners:  °Usually you can restart any strong blood thinners after the second postoperative day.  It is OK to take aspirin right away.    ° If you are on strong blood thinners (warfarin/Coumadin, Plavix, Xerelto, Eliquis, Pradaxa, etc), discuss with your surgeon, medicine PCP, and/or cardiologist for instructions on when to restart the blood thinner & if blood monitoring is needed (PT/INR blood check, etc).   ° ° °PAIN CONTROL °Pain after surgery or related to activity is often due to strain/injury to muscle, tendon, nerves and/or incisions.  This pain is usually  short-term and will improve in a few months.  °To help speed the process of healing and to get back to regular activity more quickly, DO THE FOLLOWING THINGS TOGETHER: °1. Increase activity gradually.  DO NOT PUSH THROUGH PAIN °2. Use Ice and/or Heat °3. Try Gentle Massage and/or Stretching °4. Take over the counter pain medication °5. Take Narcotic prescription pain medication for more severe pain ° °Good pain control = faster recovery.  It is better to take more medicine to be more active than to stay in bed all day to avoid medications. °1.  Increase activity gradually °Avoid heavy lifting at first, then increase to lifting as tolerated over the next 6 weeks. °Do not “push through” the pain.  Listen to your body and avoid positions and maneuvers than reproduce the pain.  Wait a few days before trying something more intense °Walking an hour a day is encouraged to help your body recover faster   and more safely.  Start slowly and stop when getting sore.  If you can walk 30 minutes without stopping or pain, you can try more intense activity (running, jogging, aerobics, cycling, swimming, treadmill, sex, sports, weightlifting, etc.) °Remember: If it hurts to do it, then don’t do it! °2. Use Ice and/or Heat °You will have swelling and bruising around the incisions.  This will take several weeks to resolve. °Ice packs or heating pads (6-8 times a day, 30-60 minutes at a time) will help sooth soreness & bruising. °Some people prefer to use ice alone, heat alone, or alternate between ice & heat.  Experiment and see what works best for you.  Consider trying ice for the first few days to help decrease swelling and bruising; then, switch to heat to help relax sore spots and speed recovery. °Shower every day.  Short baths are fine.  It feels good!  Keep the incisions and wounds clean with soap & water.   °3. Try Gentle Massage and/or Stretching °Massage at the area of pain many times a day °Stop if you feel pain - do not  overdo it °4. Take over the counter pain medication °This helps the muscle and nerve tissues become less irritable and calm down faster °Choose ONE of the following over-the-counter anti-inflammatory medications: °Acetaminophen 500mg tabs (Tylenol) 1-2 pills with every meal and just before bedtime (avoid if you have liver problems or if you have acetaminophen in you narcotic prescription) °Naproxen 220mg tabs (ex. Aleve, Naprosyn) 1-2 pills twice a day (avoid if you have kidney, stomach, IBD, or bleeding problems) °Ibuprofen 200mg tabs (ex. Advil, Motrin) 3-4 pills with every meal and just before bedtime (avoid if you have kidney, stomach, IBD, or bleeding problems) °Take with food/snack several times a day as directed for at least 2 weeks to help keep pain / soreness down & more manageable. °5. Take Narcotic prescription pain medication for more severe pain °A prescription for strong pain control is often given to you upon discharge (for example: oxycodone/Percocet, hydrocodone/Norco/Vicodin, or tramadol/Ultram) °Take your pain medication as prescribed. °Be mindful that most narcotic prescriptions contain Tylenol (acetaminophen) as well - avoid taking too much Tylenol. °If you are having problems/concerns with the prescription medicine (does not control pain, nausea, vomiting, rash, itching, etc.), please call us [phone] to see if we need to switch you to a different pain medicine that will work better for you and/or control your side effects better. °If you need a refill on your pain medication, you must call the office before 4 pm and on weekdays only.  By federal law, prescriptions for narcotics cannot be called into a pharmacy.  They must be filled out on paper & picked up from our office by the patient or authorized caretaker.  Prescriptions cannot be filled after 4 pm nor on weekends.   ° °WHEN TO CALL US [phone] °Severe uncontrolled or worsening pain  °Fever over 101 F (38.5 C) °Concerns with  the incision: Worsening pain, redness, rash/hives, swelling, bleeding, or drainage °Reactions / problems with new medications (itching, rash, hives, nausea, etc.) °Nausea and/or vomiting °Difficulty urinating °Difficulty breathing °Worsening fatigue, dizziness, lightheadedness, blurred vision °Other concerns °If you are not getting better after two weeks or are noticing you are getting worse, contact our office [phone] for further advice.  We may need to adjust your medications, re-evaluate you in the office, send you to the emergency room, or see what other things we can do to help. °The   clinic staff is available to answer your questions during regular business hours (8:30am-5pm).  Please don’t hesitate to call and ask to speak to one of our nurses for clinical concerns.    °A surgeon from [facility] is always on call at the hospitals 24 hours/day °If you have a medical emergency, go to the nearest emergency room or call 911. ° °FOLLOW UP in our office °One the day of your discharge from the hospital (or the next business weekday), please call [facility] to set up or confirm an appointment to see your surgeon in the office for a follow-up appointment.  Usually it is 2-3 weeks after your surgery.   °If you have skin staples at your incision(s), let the office know so we can set up a time in the office for the nurse to remove them (usually around 10 days after surgery). °Make sure that you call for appointments the day of discharge (or the next business weekday) from the hospital to ensure a convenient appointment time. °IF YOU HAVE DISABILITY OR FAMILY LEAVE FORMS, BRING THEM TO THE OFFICE FOR PROCESSING.  DO NOT GIVE THEM TO YOUR DOCTOR. ° °[facility] °[address], [address], [place], [place]  [address] ? °[phone] - Main °[phone] - Toll Free,  [phone] - Fax °[email or web address] ° °GETTING TO GOOD BOWEL HEALTH. °It is  expected for your digestive tract to need a few months to get back to normal.  It is common for your bowel movements and stools to be irregular.  You will have occasional bloating and cramping that should eventually fade away.  Until you are eating solid food normally, off all pain medications, and back to regular activities; your bowels will not be normal.   °Avoiding constipation °The goal: ONE SOFT BOWEL MOVEMENT A DAY!    °Drink plenty of fluids.  Choose water first. °TAKE A FIBER SUPPLEMENT EVERY DAY THE REST OF YOUR LIFE °During your first week back home, gradually add back a fiber supplement every day °Experiment which form you can tolerate.   There are many forms such as powders, tablets, wafers, gummies, etc °Psyllium bran (Metamucil), methylcellulose (Citrucel), Miralax or Glycolax, Benefiber, Flax Seed.  °Adjust the dose week-by-week (1/2 dose/day to 6 doses a day) until you are moving your bowels 1-2 times a day.  Cut back the dose or try a different fiber product if it is giving you problems such as diarrhea or bloating. °Sometimes a laxative is needed to help jump-start bowels if constipated until the fiber supplement can help regulate your bowels.  If you are tolerating eating & you are farting, it is okay to try a gentle laxative such as double dose MiraLax, prune juice, or Milk of Magnesia.  Avoid using laxatives too often. °Stool softeners can sometimes help counteract the constipating effects of narcotic pain medicines.  It can also cause diarrhea, so avoid using for too long. °If you are still constipated despite taking fiber daily, eating solids, and a few doses of laxatives, call our office. °Controlling diarrhea °Try drinking liquids and eating bland foods for a few days to avoid stressing your intestines further. °Avoid dairy products (especially milk & ice cream) for a short time.  The intestines often can lose the ability to digest lactose when stressed. °Avoid foods that cause gassiness or  bloating.  Typical foods include beans and other legumes, cabbage, broccoli, and dairy foods.  Avoid greasy, spicy, fast foods.  Every person has   some sensitivity to other foods, so listen to your body and avoid those foods that trigger problems for you. °Probiotics (such as active yogurt, Align, etc) may help repopulate the intestines and colon with normal bacteria and calm down a sensitive digestive tract °Adding a fiber supplement gradually can help thicken stools by absorbing excess fluid and retrain the intestines to act more normally.  Slowly increase the dose over a few weeks.  Too much fiber too soon can backfire and cause cramping & bloating. °It is okay to try and slow down diarrhea with a few doses of antidiarrheal medicines.   °Bismuth subsalicylate (ex. Kayopectate, Pepto Bismol) for a few doses can help control diarrhea.  Avoid if pregnant.   °Loperamide (Imodium) can slow down diarrhea.  Start with one tablet (2mg) first.  Avoid if you are having fevers or severe pain.  °ILEOSTOMY PATIENTS WILL HAVE CHRONIC DIARRHEA since their colon is not in use.    °Drink plenty of liquids.  You will need to drink even more glasses of water/liquid a day to avoid getting dehydrated. °Record output from your ileostomy.  Expect to empty the bag every 3-4 hours at first.  Most people with a permanent ileostomy empty their bag 4-6 times at the least.   °Use antidiarrheal medicine (especially Imodium) several times a day to avoid getting dehydrated.  Start with a dose at bedtime & breakfast.  Adjust up or down as needed.  Increase antidiarrheal medications as directed to avoid emptying the bag more than 8 times a day (every 3 hours). °Work with your wound ostomy nurse to learn care for your ostomy.  See ostomy care instructions. °TROUBLESHOOTING IRREGULAR BOWELS °1) Start with a soft & bland diet. No spicy, greasy, or fried foods.  °2) Avoid gluten/wheat or dairy products from diet to see if symptoms improve. °3) Miralax  17gm or flax seed mixed in 8oz. water or juice-daily. May use 2-4 times a day as needed. °4) Gas-X, Phazyme, etc. as needed for gas & bloating.  °5) Prilosec (omeprazole) over-the-counter as needed °6)  Consider probiotics (Align, Activa, etc) to help calm the bowels down ° °Call your doctor if you are getting worse or not getting better.  Sometimes further testing (cultures, endoscopy, X-ray studies, CT scans, bloodwork, etc.) may be needed to help diagnose and treat the cause of the diarrhea. °[facility] °[address], [address], [place], [place]  [address] °[phone] - Main.    °[phone]  - Toll Free.   [phone] - Fax °[email or web address] ° ° °

## 2015-10-18 ENCOUNTER — Encounter (HOSPITAL_COMMUNITY): Payer: Self-pay | Admitting: Surgery

## 2015-10-18 DIAGNOSIS — K567 Ileus, unspecified: Secondary | ICD-10-CM | POA: Diagnosis not present

## 2015-10-18 DIAGNOSIS — K219 Gastro-esophageal reflux disease without esophagitis: Secondary | ICD-10-CM | POA: Diagnosis present

## 2015-10-18 DIAGNOSIS — I10 Essential (primary) hypertension: Secondary | ICD-10-CM | POA: Diagnosis present

## 2015-10-18 DIAGNOSIS — R338 Other retention of urine: Secondary | ICD-10-CM | POA: Diagnosis not present

## 2015-10-18 DIAGNOSIS — Z79899 Other long term (current) drug therapy: Secondary | ICD-10-CM | POA: Diagnosis not present

## 2015-10-18 DIAGNOSIS — E7800 Pure hypercholesterolemia, unspecified: Secondary | ICD-10-CM | POA: Diagnosis present

## 2015-10-18 DIAGNOSIS — C642 Malignant neoplasm of left kidney, except renal pelvis: Secondary | ICD-10-CM | POA: Diagnosis not present

## 2015-10-18 DIAGNOSIS — R339 Retention of urine, unspecified: Secondary | ICD-10-CM | POA: Diagnosis not present

## 2015-10-18 DIAGNOSIS — C49A3 Gastrointestinal stromal tumor of small intestine: Secondary | ICD-10-CM | POA: Diagnosis not present

## 2015-10-18 DIAGNOSIS — N281 Cyst of kidney, acquired: Secondary | ICD-10-CM | POA: Diagnosis not present

## 2015-10-18 DIAGNOSIS — Z833 Family history of diabetes mellitus: Secondary | ICD-10-CM | POA: Diagnosis not present

## 2015-10-18 DIAGNOSIS — Z87891 Personal history of nicotine dependence: Secondary | ICD-10-CM | POA: Diagnosis not present

## 2015-10-18 DIAGNOSIS — N2889 Other specified disorders of kidney and ureter: Secondary | ICD-10-CM | POA: Diagnosis present

## 2015-10-18 DIAGNOSIS — Z8249 Family history of ischemic heart disease and other diseases of the circulatory system: Secondary | ICD-10-CM | POA: Diagnosis not present

## 2015-10-18 DIAGNOSIS — Z7982 Long term (current) use of aspirin: Secondary | ICD-10-CM | POA: Diagnosis not present

## 2015-10-18 DIAGNOSIS — H9193 Unspecified hearing loss, bilateral: Secondary | ICD-10-CM | POA: Diagnosis present

## 2015-10-18 DIAGNOSIS — I129 Hypertensive chronic kidney disease with stage 1 through stage 4 chronic kidney disease, or unspecified chronic kidney disease: Secondary | ICD-10-CM | POA: Diagnosis not present

## 2015-10-18 DIAGNOSIS — K3189 Other diseases of stomach and duodenum: Secondary | ICD-10-CM | POA: Diagnosis present

## 2015-10-18 DIAGNOSIS — N183 Chronic kidney disease, stage 3 (moderate): Secondary | ICD-10-CM | POA: Diagnosis not present

## 2015-10-18 LAB — CREATININE, FLUID (PLEURAL, PERITONEAL, JP DRAINAGE): CREAT FL: 1.4 mg/dL

## 2015-10-18 LAB — BASIC METABOLIC PANEL
ANION GAP: 5 (ref 5–15)
BUN: 18 mg/dL (ref 6–20)
CO2: 26 mmol/L (ref 22–32)
Calcium: 8.6 mg/dL — ABNORMAL LOW (ref 8.9–10.3)
Chloride: 105 mmol/L (ref 101–111)
Creatinine, Ser: 1.48 mg/dL — ABNORMAL HIGH (ref 0.61–1.24)
GFR calc Af Amer: 52 mL/min — ABNORMAL LOW (ref >60)
GFR, EST NON AFRICAN AMERICAN: 45 mL/min — AB (ref >60)
Glucose, Bld: 107 mg/dL — ABNORMAL HIGH (ref 65–99)
POTASSIUM: 4.1 mmol/L (ref 3.5–5.1)
SODIUM: 136 mmol/L (ref 135–145)

## 2015-10-18 LAB — CBC
HEMATOCRIT: 31.7 % — AB (ref 39.0–52.0)
HEMOGLOBIN: 10.8 g/dL — AB (ref 13.0–17.0)
MCH: 32.5 pg (ref 26.0–34.0)
MCHC: 34.1 g/dL (ref 30.0–36.0)
MCV: 95.5 fL (ref 78.0–100.0)
Platelets: 232 10*3/uL (ref 150–400)
RBC: 3.32 MIL/uL — ABNORMAL LOW (ref 4.22–5.81)
RDW: 14.1 % (ref 11.5–15.5)
WBC: 7.6 10*3/uL (ref 4.0–10.5)

## 2015-10-18 LAB — MAGNESIUM: Magnesium: 1.6 mg/dL — ABNORMAL LOW (ref 1.7–2.4)

## 2015-10-18 MED ORDER — METOPROLOL TARTRATE 5 MG/5ML IV SOLN: 5.0000 mg | Freq: Four times a day (QID) | INTRAVENOUS | Status: DC | PRN

## 2015-10-18 MED ORDER — PANTOPRAZOLE SODIUM 40 MG PO TBEC
80.0000 mg | DELAYED_RELEASE_TABLET | Freq: Every day | ORAL | Status: DC
  Administered 2015-10-18 – 2015-10-21 (×4): 80 mg via ORAL
  Filled 2015-10-18 (×5): qty 2

## 2015-10-18 MED ORDER — DORZOLAMIDE HCL 2 % OP SOLN
1.0000 [drp] | Freq: Two times a day (BID) | OPHTHALMIC | Status: DC
  Administered 2015-10-18 – 2015-10-21 (×7): 1 [drp] via OPHTHALMIC
  Filled 2015-10-18 (×2): qty 10

## 2015-10-18 MED ORDER — ACETAMINOPHEN 500 MG PO TABS
1000.0000 mg | ORAL_TABLET | Freq: Three times a day (TID) | ORAL | Status: DC
  Administered 2015-10-18 – 2015-10-21 (×10): 1000 mg via ORAL
  Filled 2015-10-18 (×9): qty 2

## 2015-10-18 NOTE — Progress Notes (Signed)
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 Path pending.   Today "Vignesh" is stable. Hgb and Cr acceptable. JP with <139mL output since surgery.   Objective: Vital signs in last 24 hours: Temp:  [97.3 F (36.3 C)-99.2 F (37.3 C)] 99.2 F (37.3 C) (08/17 0421) Pulse Rate:  [62-95] 63 (08/17 0421) Resp:  [10-16] 15 (08/17 0421) BP: (104-145)/(40-92) 110/59 (08/17 0421) SpO2:  [98 %-100 %] 100 % (08/17 0421) Last BM Date: 10/16/15  Intake/Output from previous day: 08/16 0701 - 08/17 0700 In: 5301.7 [I.V.:5301.7] Out: 1740 [Urine:1450; Emesis/NG output:105; Drains:135; Blood:50] Intake/Output this shift: No intake/output data recorded.  General appearance: alert, cooperative and appears stated age Eyes: negative Nose: Nares normal. Septum midline. Mucosa normal. No drainage or sinus tenderness., NGT in place with small voluem bilous output Resp: non-labored Cardio: Nl rate GI: soft, non-tender; bowel sounds normal; no masses,  no organomegaly and recent surgical sites c/d/i. No hernias. JP with scant serosanguinous outptu.  Male genitalia: normal Extremities: extremities normal, atraumatic, no cyanosis or edema Neurologic: Grossly normal  Lab Results:   Recent Labs  10/18/15 0442  WBC 7.6  HGB 10.8*  HCT 31.7*  PLT 232   BMET  Recent Labs  10/18/15 0442  NA 136  K 4.1  CL 105  CO2 26  GLUCOSE 107*  BUN 18  CREATININE 1.48*  CALCIUM 8.6*   PT/INR No results for input(s): LABPROT, INR in the last 72 hours. ABG No results for input(s): PHART, HCO3 in the last 72 hours.  Invalid input(s): PCO2, PO2  Studies/Results: No results found.  Anti-infectives: Anti-infectives    Start     Dose/Rate Route Frequency Ordered Stop   10/17/15 2200  cefoTEtan (CEFOTAN) 2 g in dextrose 5 % 50 mL IVPB     2 g 100 mL/hr over 30 Minutes Intravenous Every 12 hours 10/17/15 1626  10/17/15 2319   10/17/15 0615  ceFAZolin (ANCEF) IVPB 2g/100 mL premix  Status:  Discontinued     2 g 200 mL/hr over 30 Minutes Intravenous 30 min pre-op 10/17/15 0559 10/17/15 1607   10/17/15 0558  cefoTEtan in Dextrose 5% (CEFOTAN) IVPB 2 g    Comments:  Pharmacy may adjust dose strength for optimal dosing.   Send with patient on call to the OR.  Anesthesia to complete antibiotic administration <54min prior to incision per [facility].   2 g Intravenous On call to O.R. 10/17/15 [id number] 10/17/15 0843      Assessment/Plan:  1 - Solid Left Renal Mass - doing well POD 1. JP Cr today.   [facility], Alphonsa Brickle 10/18/2015

## 2015-10-18 NOTE — Progress Notes (Signed)
[facility]  [address] [place], [place] [address] Phone: [phone] FAX: [phone]   Mike Roach [id number] 1940-05-20  CARE TEAM:  PCP: Deloria Lair, MD  Outpatient Care Team: Patient Care Team: Zella Richer. 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  If feeling nauseated or bloated or increased output, return dissection.  If tolerates and NG tube clamping trial today, remove NG tube tomorrow and advance to full liquids. -Foley catheter per urology. -HTN - low so holding meds.  PRN only -GERD - PPI -ambulate -VTE proph - SCDs.  OK to restart lovenox POD#1 if urology agress  Adin Hector, M.D., F.A.C.S. Gastrointestinal and Minimally Invasive Surgery [facility] [address] [place],  [address] [phone] Main /  Paging   10/18/2015  Subjective:  Mildly sore.  Once sustained bed.  No nausea.  Nasogastric tube output minimal.  No flatus or bowel movement yet.  Objective:  Vital signs:  Vitals:   10/17/15 1836 10/17/15 1930 10/18/15 0129 10/18/15 0421  BP: (!) 134/40 (!) 130/55 104/65 (!) 110/59  Pulse: 75 80 95 63  Resp: 15 16 16 15   Temp:  98 F (36.7 C) 98.6 F (37 C) 99.2 F (37.3 C)  TempSrc:  Oral Oral Oral  SpO2: 100% 100% 98% 100%  Weight:      Height:        Last BM Date: 10/16/15  Intake/Output   Yesterday:  08/16 0701 - 08/17 0700 In: 5301.7 [I.V.:5301.7] Out: 1740 [Urine:1450; Emesis/NG output:105; Drains:135; Blood:50] This shift:  No intake/output data recorded.  Bowel function:  Flatus: No  BM:  No  Drain: Serosanguinous   Physical Exam:  General: Pt awake/alert/oriented x4 in No acute distress Eyes: PERRL, normal EOM.  Sclera clear.  No icterus Neuro: CN II-XII intact w/o focal sensory/motor deficits. Lymph: No head/neck/groin lymphadenopathy Psych:  No delerium/psychosis/paranoia HENT: Normocephalic, Mucus membranes moist.  No thrush Neck: Supple, No tracheal deviation Chest: No chest wall pain w good excursion CV:  Pulses intact.  Regular rhythm MS: Normal AROM mjr joints.  No obvious deformity Abdomen: Soft.  Mildy distended.  Mildly tender at incisions only.  No evidence of peritonitis.  No incarcerated hernias. Ext:  SCDs BLE.  No mjr edema.  No cyanosis Skin: No petechiae / purpura  Results:   Labs: Results for orders placed or performed during the hospital encounter of 10/17/15 (from the past 48 hour(s))  Basic metabolic panel     Status: Abnormal   Collection Time: 10/18/15  4:42 AM  Result Value Ref Range   Sodium 136 135 - 145 mmol/L   Potassium 4.1 3.5 - 5.1 mmol/L   Chloride 105 101 - 111 mmol/L   CO2 26 22 - 32 mmol/L   Glucose, Bld 107 (H) 65 - 99 mg/dL   BUN 18 6 - 20 mg/dL   Creatinine, Ser 1.48 (H) 0.61 - 1.24  mg/dL   Calcium 8.6 (L) 8.9 - 10.3 mg/dL   GFR calc non Af Amer 45 (L) >60 mL/min   GFR calc Af Amer 52 (L) >60 mL/min    Comment: (NOTE) The eGFR has been calculated using the CKD EPI equation. This calculation has not been validated in all clinical situations. eGFR's persistently <60 mL/min signify possible Chronic Kidney Disease.    Anion gap 5 5 - 15  CBC     Status: Abnormal   Collection Time: 10/18/15  4:42 AM  Result Value Ref Range   WBC 7.6 4.0 - 10.5 K/uL   RBC 3.32 (L) 4.22 - 5.81 MIL/uL   Hemoglobin 10.8 (L) 13.0 - 17.0 g/dL   HCT 31.7 (L) 39.0 - 52.0 %   MCV 95.5 78.0 - 100.0 fL   MCH 32.5 26.0 - 34.0 pg   MCHC 34.1 30.0 - 36.0 g/dL   RDW 14.1 11.5 - 15.5 %   Platelets 232 150 - 400 K/uL  Magnesium     Status: Abnormal   Collection Time: 10/18/15  4:42 AM  Result Value Ref Range   Magnesium 1.6 (L) 1.7 - 2.4 mg/dL    Imaging / Studies: No results found.  Medications / Allergies: per chart  Antibiotics: Anti-infectives    Start     Dose/Rate Route Frequency Ordered Stop   10/17/15 2200  cefoTEtan (CEFOTAN) 2 g in dextrose 5 % 50 mL IVPB     2 g 100 mL/hr over 30 Minutes Intravenous Every 12 hours 10/17/15 1626 10/17/15 2319   10/17/15 0615  ceFAZolin (ANCEF) IVPB 2g/100 mL premix  Status:  Discontinued     2 g 200 mL/hr over 30 Minutes Intravenous 30 min pre-op 10/17/15 0559 10/17/15 1607   10/17/15 0558  cefoTEtan in Dextrose 5% (CEFOTAN) IVPB 2 g    Comments:  Pharmacy may adjust dose strength for optimal dosing.   Send with patient on call to the OR.  Anesthesia to complete antibiotic administration <92mn [facility]   2 g Intravenous On call to O.R. 10/17/15 [id number]/16/17 0843        Note: Portions of this report may have been transcribed using voice recognition software. Every effort was made to ensure accuracy; however, inadvertent computerized transcription errors may be present.   Any transcriptional errors that result  from this process are unintentional.     SAdin Hector M.D., F.A.C.S. Gastrointestinal and Minimally Invasive Surgery [facility] [address] [place] [phone] / Paging   10/18/2015

## 2015-10-19 DIAGNOSIS — C642 Malignant neoplasm of left kidney, except renal pelvis: Secondary | ICD-10-CM

## 2015-10-19 MED ORDER — TRAVOPROST 0.004 % OP SOLN: 1.0000 [drp] | Freq: Every day | OPHTHALMIC | Status: DC

## 2015-10-19 MED ORDER — TAMSULOSIN HCL 0.4 MG PO CAPS
0.4000 mg | ORAL_CAPSULE | Freq: Every day | ORAL | Status: DC
  Administered 2015-10-19 – 2015-10-21 (×3): 0.4 mg via ORAL
  Filled 2015-10-19 (×3): qty 1

## 2015-10-19 MED ORDER — FINASTERIDE 5 MG PO TABS
5.0000 mg | ORAL_TABLET | Freq: Every day | ORAL | Status: DC
  Administered 2015-10-19 – 2015-10-21 (×3): 5 mg via ORAL
  Filled 2015-10-19 (×3): qty 1

## 2015-10-19 MED ORDER — LATANOPROST 0.005 % OP SOLN
1.0000 [drp] | Freq: Every day | OPHTHALMIC | Status: DC
  Administered 2015-10-19 – 2015-10-20 (×2): 1 [drp] via OPHTHALMIC
  Filled 2015-10-19: qty 2.5

## 2015-10-19 MED ORDER — LISINOPRIL 20 MG PO TABS
20.0000 mg | ORAL_TABLET | Freq: Every day | ORAL | Status: DC
  Administered 2015-10-19 – 2015-10-21 (×3): 20 mg via ORAL
  Filled 2015-10-19 (×3): qty 1

## 2015-10-19 MED ORDER — ASPIRIN EC 81 MG PO TBEC
81.0000 mg | DELAYED_RELEASE_TABLET | Freq: Every day | ORAL | Status: DC
  Administered 2015-10-19 – 2015-10-21 (×3): 81 mg via ORAL
  Filled 2015-10-19 (×3): qty 1

## 2015-10-19 MED ORDER — SIMVASTATIN 20 MG PO TABS
20.0000 mg | ORAL_TABLET | Freq: Every day | ORAL | Status: DC
  Administered 2015-10-19 – 2015-10-20 (×2): 20 mg via ORAL
  Filled 2015-10-19 (×2): qty 1

## 2015-10-19 MED ORDER — ENSURE ENLIVE PO LIQD
237.0000 mL | Freq: Two times a day (BID) | ORAL | Status: DC
  Administered 2015-10-19 – 2015-10-21 (×5): 237 mL via ORAL

## 2015-10-19 MED ORDER — ADULT MULTIVITAMIN W/MINERALS CH
1.0000 | ORAL_TABLET | Freq: Every day | ORAL | Status: DC
  Administered 2015-10-19 – 2015-10-21 (×3): 1 via ORAL
  Filled 2015-10-19 (×3): qty 1

## 2015-10-19 NOTE — Progress Notes (Signed)
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 JP Cr same as serum 8/17.  2 - Post-op Urinary Retention - minimal baseline voiding complaints. Unable to void after DC foley 8/17. Catheter replaced and put on empiric tamsulosin.  Today "Mike Roach" is stable. Foley replaced last PM for retention.   Objective: Vital signs in last 24 hours: Temp:  [97.6 F (36.4 C)-98.6 F (37 C)] 98.1 F (36.7 C) (08/18 1421) Pulse Rate:  [74-86] 86 (08/18 1421) Resp:  [15-16] 16 (08/18 1421) BP: (120-142)/(65-80) 120/65 (08/18 1421) SpO2:  [94 %-99 %] 99 % (08/18 1421) Last BM Date: 10/16/15  Intake/Output from previous day: 08/17 0701 - 08/18 0700 In: 1909.2 [P.O.:720; I.V.:1164.2] Out: 1460 [Urine:1200; Emesis/NG output:170; Drains:90] Intake/Output this shift: Total I/O In: 1405.3 [P.O.:237; I.V.:248.3; IV Piggyback:920] Out: 620 [Urine:600; Drains:20]  General appearance: alert, cooperative and appears stated age Eyes: negative Nose: NGT in place Throat: lips, mucosa, and tongue normal; teeth and gums normal Back: symmetric, no curvature. ROM normal. No CVA tenderness. Resp: non-labored Cardio: Nl rate GI: soft, non-tender; bowel sounds normal; no masses,  no organomegaly and recent surgical sites c/d/i. JP with scant serosanguinous fluid.  Male genitalia: normal, foley c/d/i with yellow urine.  Extremities: extremities normal, atraumatic, no cyanosis or edema Pulses: 2+ and symmetric Neurologic: Grossly normal  Lab Results:   Recent Labs  10/18/15 0442  WBC 7.6  HGB 10.8*  HCT 31.7*  PLT 232   BMET  Recent Labs  10/18/15 0442  NA 136  K 4.1  CL 105  CO2 26  GLUCOSE 107*  BUN 18  CREATININE 1.48*  CALCIUM 8.6*   PT/INR No results for input(s): LABPROT, INR in the last 72 hours. ABG No results for input(s):  PHART, HCO3 in the last 72 hours.  Invalid input(s): PCO2, PO2  Studies/Results: No results found.  Anti-infectives: Anti-infectives    Start     Dose/Rate Route Frequency Ordered Stop   10/17/15 2200  cefoTEtan (CEFOTAN) 2 g in dextrose 5 % 50 mL IVPB     2 g 100 mL/hr over 30 Minutes Intravenous Every 12 hours 10/17/15 1626 10/17/15 2319   10/17/15 0615  ceFAZolin (ANCEF) IVPB 2g/100 mL premix  Status:  Discontinued     2 g 200 mL/hr over 30 Minutes Intravenous 30 min pre-op 10/17/15 0559 10/17/15 1607   10/17/15 0558  cefoTEtan in Dextrose 5% (CEFOTAN) IVPB 2 g    Comments:  Pharmacy may adjust dose strength for optimal dosing.   Send with patient on call to the OR.  Anesthesia to complete antibiotic administration <14min prior to incision per [facility].   2 g Intravenous On call to O.R. 10/17/15 0558 10/17/15 0843      Assessment/Plan:  1 - Solid Left Renal Mass - excellent prognosis following margin-negative resection for stage 1 disease. No further treatment in house. OK to DC JP from Urol perspective anytime.   2 - Post-op Urinary Retention - start tamsulosin, repeat voiding trial 8/19.   [facility], Mike Roach 10/19/2015

## 2015-10-19 NOTE — Progress Notes (Addendum)
[facility]  [address] [place], [place] [address] Phone: [phone] FAX: [phone]   Brennin Durfee [id number] [birth year]  CARE TEAM:  PCP: Deloria Lair, MD  Outpatient Care Team: Patient Care Team: Zella Richer. 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 Soft tissue mass, simple excision, duodenal wall ? gist tumor - GASTROINTESTINAL STROMAL TUMOR, 3.5 CM. - SURGICAL MARGIN INVOLVED BY TUMOR. - SEE ONCOLOGY TABLE.  9. 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 - MARGINS NOT INVOLVED. - SEE ONCOLOGY TABLE.  2. Cyst, excision, kidney left upper pole wall - BENIGN RENAL CYST WALL. - NO MALIGNANCY IDENTIFIED. 3. Cyst, excision, kidney left lower pole wall #1 - BENIGN RENAL CYST WALL. - NO MALIGNANCY IDENTIFIED. 4. Cyst,  excision, kidney left lower pole wall #2 - BENIGN RENAL CYST WALL. - NO MALIGNANCY IDENTIFIED. 5. Cyst, excision, kidney left lower pole wall #3 - BENIGN FIBROVASCULAR CONNECTIVE TISSUE. - NO MALIGNANCY IDENTIFIED. 6. Cyst, excision, kidney left lower pole wall #4 - BENIGN RENAL CYST WALL. - NO MALIGNANCY IDENTIFIED. 7. Cyst, excision, kidney left lower pole wall #% - BENIGN RENAL CYST WALL. - NO MALIGNANCY IDENTIFIED. 8. Kidney, biopsy, left renal mass deep margin - BENIGN KIDNEY. - NO MALIGNANCY IDENTIFIED.   Assessment  Doing relatively okay status post duodenal mass resection and partial left nephrectomy  Plan:  -d/c NG tube since tolerated clamping trial.  Adv full liquids -adv to solids when has flatus -If feeling nauseated or bloated, dec to sips & replace NGT PRN -Foley catheter per urology. -HTN - restart meds -GERD - PPI -ambulate -VTE proph - SCDs.  OK to restart lovenox POD#1 if urology agress  Adin Hector, M.D., F.A.C.S. Gastrointestinal and Minimally Invasive Surgery [facility] [address] [place], [place] [address] [phone] Main / Paging   10/19/2015  Subjective:  Mildly sore.  Sitting  No nausea with NGT clamping trial & tol PO  No flatus or bowel movement yet.  Foley replaced   Objective:  Vital signs:  Vitals:   10/18/15 1313 10/18/15 2102 10/18/15 2145 10/19/15 0515  BP: 98/63 136/80 138/65 (!) 142/74  Pulse: 68 74 76 79  Resp: 18 16 15 16   Temp: 98.6 F (37 C)  97.6 F (36.4 C) 98.6 F (37 C)  TempSrc: Oral  Oral Oral  SpO2: 100% 94% 99% 99%  Weight:      Height:        Last BM  Date: 10/16/15  Intake/Output   Yesterday:  08/17 0701 - 08/18 0700 In: 1909.2 [P.O.:720; I.V.:1164.2] Out: 1460 [Urine:1200; Emesis/NG output:170; Drains:90] This shift:  No intake/output data recorded.  Bowel function:  Flatus: No  BM:  No  Drain: Serosanguinous   Physical  Exam:  General: Pt awake/alert/oriented x4 in No acute distress Eyes: PERRL, normal EOM.  Sclera clear.  No icterus Neuro: CN II-XII intact w/o focal sensory/motor deficits. Lymph: No head/neck/groin lymphadenopathy Psych:  No delerium/psychosis/paranoia HENT: Normocephalic, Mucus membranes moist.  No thrush Neck: Supple, No tracheal deviation Chest: No chest wall pain w good excursion CV:  Pulses intact.  Regular rhythm MS: Normal AROM mjr joints.  No obvious deformity Abdomen: Soft.  Mildy distended.  Mildly tender at incisions only.  No evidence of peritonitis.  No incarcerated hernias. Ext:  SCDs BLE.  No mjr edema.  No cyanosis Skin: No petechiae / purpura  Results:   Labs: Results for orders placed or performed during the hospital encounter of 10/17/15 (from the past 48 hour(s))  Basic metabolic panel     Status: Abnormal   Collection Time: 10/18/15  4:42 AM  Result Value Ref Range   Sodium 136 135 - 145 mmol/L   Potassium 4.1 3.5 - 5.1 mmol/L   Chloride 105 101 - 111 mmol/L   CO2 26 22 - 32 mmol/L   Glucose, Bld 107 (H) 65 - 99 mg/dL   BUN 18 6 - 20 mg/dL   Creatinine, Ser 1.48 (H) 0.61 - 1.24 mg/dL   Calcium 8.6 (L) 8.9 - 10.3 mg/dL   GFR calc non Af Amer 45 (L) >60 mL/min   GFR calc Af Amer 52 (L) >60 mL/min    Comment: (NOTE) The eGFR has been calculated using the CKD EPI equation. This calculation has not been validated in all clinical situations. eGFR's persistently <60 mL/min signify possible Chronic Kidney Disease.    Anion gap 5 5 - 15  CBC     Status: Abnormal   Collection Time: 10/18/15  4:42 AM  Result Value Ref Range   WBC 7.6 4.0 - 10.5 K/uL   RBC 3.32 (L) 4.22 - 5.81 MIL/uL   Hemoglobin 10.8 (L) 13.0 - 17.0 g/dL   HCT 31.7 (L) 39.0 - 52.0 %   MCV 95.5 78.0 - 100.0 fL   MCH 32.5 26.0 - 34.0 pg   MCHC 34.1 30.0 - 36.0 g/dL   RDW 14.1 11.5 - 15.5 %   Platelets 232 150 - 400 K/uL  Magnesium     Status: Abnormal   Collection Time: 10/18/15  4:42 AM   Result Value Ref Range   Magnesium 1.6 (L) 1.7 - 2.4 mg/dL  Urology - JP creatinine     Status: None   Collection Time: 10/18/15  8:12 AM  Result Value Ref Range   Creat, Fluid 1.4 mg/dL    Comment: (NOTE) No normal range established for this test Results should be evaluated in conjunction with serum values Performed at [facility]    Fluid Type-FCRE JP DRAINAGE     Imaging / Studies: No results found.  Medications / Allergies: per chart  Antibiotics: Anti-infectives    Start     Dose/Rate Route Frequency Ordered Stop   10/17/15 2200  cefoTEtan (CEFOTAN) 2 g in dextrose 5 % 50 mL IVPB     2 g 100 mL/hr over 30 Minutes Intravenous Every 12 hours 10/17/15 1626 10/17/15 2319   10/17/15 0615  ceFAZolin (ANCEF) IVPB 2g/100  mL premix  Status:  Discontinued     2 g 200 mL/hr over 30 Minutes Intravenous 30 min pre-op 10/17/15 0559 10/17/15 1607   10/17/15 0558  cefoTEtan in Dextrose 5% (CEFOTAN) IVPB 2 g    Comments:  Pharmacy may adjust dose strength for optimal dosing.   Send with patient on call to the OR.  Anesthesia to complete antibiotic administration [facility]32mn [facility]   2 g Intravenous On call to O.R. 10/17/15 [id number]/16/17 0843        Note: Portions of this report may have been transcribed using voice recognition software. Every effort was made to ensure accuracy; however, inadvertent computerized transcription errors may be present.   Any transcriptional errors that result from this process are unintentional.     SAdin Hector M.D., F.A.C.S. Gastrointestinal and Minimally Invasive Surgery [facility] [address] [place] [phone] / Paging   10/19/2015

## 2015-10-19 NOTE — Progress Notes (Signed)
Pt having problem voiding, complains of being full on his bladder, dribbling urination. Bladder scan shows 460cc of urine, in and out cath was performed but unsuccessful. Paged on call provider- ordered to insert a coude cath and to keep it for tonight. Nurse from 4th floor urology unit was called was the one who successfully put in the catheter. Patient tolerate the procedure and we will continue to monitor.

## 2015-10-20 ENCOUNTER — Telehealth: Payer: Self-pay | Admitting: Hematology and Oncology

## 2015-10-20 MED ORDER — OXYCODONE HCL 5 MG PO TABS
5.0000 mg | ORAL_TABLET | ORAL | Status: DC | PRN
Start: 2015-10-20 — End: 2015-10-21

## 2015-10-20 MED ORDER — METHOCARBAMOL 500 MG PO TABS: 1000.0000 mg | ORAL_TABLET | Freq: Four times a day (QID) | ORAL | Status: DC | PRN

## 2015-10-20 NOTE — Telephone Encounter (Signed)
Called patient to conf appt. L/M Appt letter and schd mailed. 10/20/15 °

## 2015-10-20 NOTE — Progress Notes (Signed)
Mike Roach., [place], [place] [address]26-5[address] Phone: [phone] FAX: [phone]   Lacy Countess [id number] 1940/09/29  CARE TEAM:  PCP: Deloria Lair, MD  Outpatient Care Team: Patient Care Team: Mike Roach. 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 Soft tissue mass, simple excision, duodenal wall ? gist tumor - GASTROINTESTINAL STROMAL TUMOR, 3.5 CM. - SURGICAL MARGIN INVOLVED BY TUMOR. - SEE ONCOLOGY TABLE.  9. 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 - MARGINS NOT INVOLVED. - SEE ONCOLOGY TABLE.  2. Cyst, excision, kidney left upper pole wall - BENIGN RENAL CYST WALL. - NO MALIGNANCY IDENTIFIED. 3.  Cyst, excision, kidney left lower pole wall #1 - BENIGN RENAL CYST WALL. - NO MALIGNANCY IDENTIFIED. 4. Cyst, excision, kidney left lower pole wall #2 - BENIGN RENAL CYST WALL. - NO MALIGNANCY IDENTIFIED. 5. Cyst, excision, kidney left lower pole wall #3 - BENIGN FIBROVASCULAR CONNECTIVE TISSUE. - NO MALIGNANCY IDENTIFIED. 6. Cyst, excision, kidney left lower pole wall #4 - BENIGN RENAL CYST WALL. - NO MALIGNANCY IDENTIFIED. 7. Cyst, excision, kidney left lower pole wall #% - BENIGN RENAL CYST WALL. - NO MALIGNANCY IDENTIFIED. 8. Kidney, biopsy, left renal mass deep margin - BENIGN KIDNEY. - NO MALIGNANCY IDENTIFIED.   Assessment  Doing better status post duodenal mass resection and partial left nephrectomy  Plan:  -Path c/w GIST low mit rate but >2cm.  ?Need for Gleevec post adjucvant? - set up for GI tumor board -renal cell CA - resected - per urology -adv to solids  -If feeling nauseated or bloated, dec to sips & replace NGT PRN -Foley catheter per urology. - voiding trial today -d/c blake drain -HTN - stable - follow -GERD - PPI -ambulate -VTE proph - SCDs.  OK to restart lovenox   D/C patient from hospital when patient meets criteria (anticipate in 1-2 day(s)):  Tolerating oral intake well Ambulating well Adequate pain control without IV medications Urinating  Having flatus Disposition planning in place  I updated the patient's status to the patient and nurse.  Pathology discussed.  Recommendations were made.  Questions were answered.  They expressed understanding & appreciation.   Mike Roach, M.D., F.A.C.S. Gastrointestinal and Minimally Invasive Surgery [facility] [address], [place], [place] [address] [phone] Main / Paging   10/20/2015  Subjective:  Less sore.  Sitting  Foley replaced   Objective:  Vital signs:  Vitals:   10/19/15 0515 10/19/15 1421 10/19/15 2211 10/20/15 0519  BP: (!)  142/74 120/65 (!) 131/95 121/61  Pulse: 79 86 93 85  Resp: 16 16 18 18   Temp: 98.6 F (37 C) 98.1 F (36.7 C) 98.6 F (37 C) 98.3 F (36.8 C)  TempSrc: Oral Oral Oral Oral  SpO2: 99% 99% 97% 100%  Weight:      Height:        Last BM Date: 10/16/15  Intake/Output   Yesterday:  08/18 0701 - 08/19 0700 In: 1765.3 [P.O.:597; I.V.:248.3; IV Piggyback:920] Out: [id number] [Urine:1600; Drains:80] This shift:  No intake/output data recorded.  Bowel function:  Flatus: YES  BM:  No  Drain: Serosanguinous   Physical Exam:  General: Pt awake/alert/oriented x4 in No acute distress Eyes: PERRL, normal EOM.  Sclera clear.  No icterus Neuro: CN II-XII intact w/o focal sensory/motor deficits. Lymph: No head/neck/groin lymphadenopathy Psych:  No delerium/psychosis/paranoia HENT: Normocephalic, Mucus membranes moist.  No thrush Neck: Supple, No tracheal deviation Chest: No chest wall pain w good excursion CV:  Pulses intact.  Regular rhythm MS: Normal AROM mjr joints.  No obvious deformity Abdomen: Soft.  Nondistended.  Mildly tender at incisions only.  No evidence of peritonitis.  No incarcerated hernias. Ext:  SCDs BLE.  No mjr edema.  No cyanosis Skin: No petechiae / purpura  Results:   Labs: No results found for this or any previous visit (from the past 48 hour(s)).  Imaging / Studies: No results found.  Medications / Allergies: per chart  Antibiotics: Anti-infectives    Start     Dose/Rate Route Frequency Ordered Stop   10/17/15 2200  cefoTEtan (CEFOTAN) 2 g in dextrose 5 % 50 mL IVPB     2 g 100 mL/hr over 30 Minutes Intravenous Every 12 hours 10/17/15 1626 10/17/15 2319   10/17/15 0615  ceFAZolin (ANCEF) IVPB 2g/100 mL premix  Status:  Discontinued     2 g 200 mL/hr over 30 Minutes Intravenous 30 min pre-op 10/17/15 0559 10/17/15 1607   10/17/15 0558  cefoTEtan in Dextrose 5% (CEFOTAN) IVPB 2 g    Comments:  Pharmacy may adjust dose strength for optimal dosing.    Send with patient on call to the OR.  Anesthesia to complete antibiotic administration <39min prior to incision per [facility].   2 g Intravenous On call to O.R. 10/17/15 [id number] 10/17/15 0843        Note: Portions of this report may have been transcribed using voice recognition software. Every effort was made to ensure accuracy; however, inadvertent computerized transcription errors may be present.   Any transcriptional errors that result from this process are unintentional.     Mike Roach, M.D., F.A.C.S. Gastrointestinal and Minimally Invasive Surgery [facility] [address] [place], [place] [address] [phone] Main / Paging   10/20/2015

## 2015-10-20 NOTE — Progress Notes (Signed)
Patient ID: Mike Roach, male   DOB: 1940/07/15, 75 y.o.   MRN: [id number]  Assessment: Postop urinary retention - we discussed the fact that this can occur and is typically temporary. He will undergo a voiding trial this morning having been started on tamsulosin.  Plan: 1. Continue tamsulosin for now. 2. Foley catheter out for voiding trial today.    Subjective: Patient reports no urologic complaints this morning. He denies any bladder spasms or suprapubic discomfort. He reports he has no underlying voiding difficulty and has never had urinary retention previously. He has been started on tamsulosin.  Objective: Vital signs in last 24 hours: Temp:  [98.1 F (36.7 C)-98.6 F (37 C)] 98.3 F (36.8 C) (08/19 0519) Pulse Rate:  [85-93] 85 (08/19 0519) Resp:  [16-18] 18 (08/19 0519) BP: (120-131)/(61-95) 121/61 (08/19 0519) SpO2:  [97 %-100 %] 100 % (08/19 0519)A  Intake/Output from previous day: 08/18 0701 - 08/19 0700 In: 1765.3 [P.O.:597; I.V.:248.3; IV Piggyback:920] Out: [id number] [Urine:1600; Drains:80] Intake/Output this shift: No intake/output data recorded.  Past Medical History:  Diagnosis Date  . Adenomatous colon polyp 09/21/2014  . Anemia in chronic illness 03/07/2015   being evaluated  . Arthritis   . Bilateral hearing loss 03/15/2015  . Chronic kidney disease   . Chronic kidney disease (CKD) 03/07/2015  . GERD (gastroesophageal reflux disease)   . Glaucoma   . Hypercholesterolemia   . Hypertension   . Iron deficiency anemia 03/15/2015  . Prostate enlargement   . Skin lesion of left leg 03/07/2015    Physical Exam:  Lungs - Normal respiratory effort, chest expands symmetrically.  Abdomen - Soft, non-tender & non-distended.  Lab Results:  Recent Labs  10/18/15 0442  WBC 7.6  HGB 10.8*  HCT 31.7*   BMET  Recent Labs  10/18/15 0442  NA 136  K 4.1  CL 105  CO2 26  GLUCOSE 107*  BUN 18  CREATININE 1.48*  CALCIUM 8.6*   No results for input(s):  LABURIN in the last 72 hours. No results found for this or any previous visit.  Studies/Results: No results found.    Mike Roach C 10/20/2015, 7:12 AM

## 2015-10-21 MED ORDER — TAMSULOSIN HCL 0.4 MG PO CAPS: 0.4000 mg | ORAL_CAPSULE | Freq: Every day | ORAL | 1 refills | Status: DC

## 2015-10-21 NOTE — Progress Notes (Signed)
Patient ID: Mike Roach, male   DOB: January 27, 1941, 75 y.o.   MRN: [id number]  Assessment: 1. Status post left partial nephrectomy - patient is doing well no flank pain or nausea. His creatinine from his JP was consistent with serum. He has progressed appropriately and is ready for discharge from urologic standpoint.  2. Postoperative urinary retention - he has passed his voiding trial and is voiding spontaneously currently.  Plan: He will be discharged home today with follow-up with Dr. Tresa Moore on 11/01/15.     Subjective: Patient reports   Objective: Vital signs in last 24 hours: Temp:  [98.2 F (36.8 C)-98.7 F (37.1 C)] 98.7 F (37.1 C) (08/20 0623) Pulse Rate:  [80-92] 82 (08/20 0623) Resp:  [18] 18 (08/20 0623) BP: (103-131)/(63-67) 103/67 (08/20 0623) SpO2:  [99 %-100 %] 99 % (08/20 0623)A  Intake/Output from previous day: 08/19 0701 - 08/20 0700 In: -  Out: 1160 [Urine:1150; Drains:10] Intake/Output this shift: Total I/O In: 240 [P.O.:240] Out: -   Past Medical History:  Diagnosis Date  . Adenomatous colon polyp 09/21/2014  . Anemia in chronic illness 03/07/2015   being evaluated  . Arthritis   . Bilateral hearing loss 03/15/2015  . Chronic kidney disease   . Chronic kidney disease (CKD) 03/07/2015  . GERD (gastroesophageal reflux disease)   . Glaucoma   . Hypercholesterolemia   . Hypertension   . Iron deficiency anemia 03/15/2015  . Prostate enlargement   . Skin lesion of left leg 03/07/2015    Physical Exam:  Lungs - Normal respiratory effort, chest expands symmetrically.  Abdomen - Soft, non-tender & non-distended.  Lab Results: No results for input(s): WBC, HGB, HCT in the last 72 hours. BMET No results for input(s): NA, K, CL, CO2, GLUCOSE, BUN, CREATININE, CALCIUM in the last 72 hours. No results for input(s): LABURIN in the last 72 hours. No results found for this or any previous visit.  Studies/Results: No results found.    Jayda White  C 10/21/2015, 9:34 AM

## 2015-10-21 NOTE — Care Management Important Message (Signed)
Important Message  Patient Details  Name: Daid Tresch MRN: [id number] Date of Birth: 1941-01-28   Medicare Important Message Given:  Yes    Apolonio Schneiders, RN 10/21/2015, 10:11 AM

## 2015-10-21 NOTE — Discharge Summary (Signed)
Physician Discharge Summary  Patient ID: Mike Roach MRN: [id number] DOB/AGE: October 27, 1940 75 y.o.  Admit date: 10/17/2015 Discharge date: 10/21/2015  Patient Care Team: Zella Richer. 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 10/17/2015  Active Problems:   Anemia in chronic illness   Chronic kidney disease, stage III (moderate)   Kidney lesion, native, left   Hypertension   Chronic kidney disease (CKD)   GERD (gastroesophageal reflux disease)   Duodenal mass   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/17/2015   Discharge Diagnoses:  Principal Problem:   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 Solid left renal mass and background of numerous left renal cyst     with mass effect. 2. Gastrointestinal mass.  PROCEDURES: 1. Robotic-assisted laparoscopic left partial nephrectomy. 2. Left robotic renal cyst decortication. 3. Intraoperative  ultrasound with interpretation.  FIRS ASSISTANT:  Debbrah Alar, PA.   PATHOLOGY  Diagnosis 1. Soft tissue mass, simple excision, duodenal wall ? gist tumor - GASTROINTESTINAL STROMAL TUMOR, 3.5 CM. - SURGICAL MARGIN INVOLVED BY TUMOR. - SEE ONCOLOGY TABLE.  9. 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 - MARGINS NOT INVOLVED. - SEE ONCOLOGY TABLE.  2. Cyst, excision, kidney left upper pole wall - BENIGN RENAL CYST WALL. - NO MALIGNANCY IDENTIFIED. 3. Cyst, excision, kidney left lower pole wall #1 - BENIGN RENAL CYST WALL. - NO MALIGNANCY IDENTIFIED. 4. Cyst, excision, kidney left lower pole wall #2 - BENIGN RENAL CYST WALL. - NO MALIGNANCY IDENTIFIED. 5. Cyst, excision, kidney left lower pole wall #3 - BENIGN FIBROVASCULAR CONNECTIVE TISSUE. - NO MALIGNANCY IDENTIFIED. 6. Cyst, excision, kidney left lower pole wall #4 - BENIGN RENAL CYST WALL. - NO MALIGNANCY IDENTIFIED. 7. Cyst, excision, kidney left lower pole wall #% - BENIGN RENAL CYST WALL. - NO MALIGNANCY IDENTIFIED. 8. Kidney, biopsy, left renal mass deep margin - BENIGN KIDNEY. - NO MALIGNANCY IDENTIFIED.   Consults: urology  Hospital Course:   The patient underwent the surgery above.  Postoperatively, the patient gradually mobilized.  Ileus resolved.  NGT removed.  Gradually advanced to a solid diet.  Pain and other symptoms were treated aggressively.  Urinary retention Tx with tamsulosin.  By the time of discharge, the patient was walking well the hallways, eating food, having flatus.  Pain was well-controlled on an oral medications.  Based on meeting discharge criteria and continuing to recover, I felt it was safe for the patient to be discharged from the hospital to further recover with close followup. Postoperative  recommendations were discussed in detail.  They are written as well.   Significant Diagnostic Studies:  Results for orders  placed or performed during the hospital encounter of 10/17/15 (from the past 72 hour(s))  Urology - JP creatinine     Status: None   Collection Time: 10/18/15  8:12 AM  Result Value Ref Range   Creat, Fluid 1.4 mg/dL    Comment: (NOTE) No normal range established for this test Results should be evaluated in conjunction with serum values Performed at [facility]     No results found.  Discharge Exam: Blood pressure 103/67, pulse 82, temperature 98.7 F (37.1 C), temperature source Oral, resp. rate 18, height 6\' 2"  (1.88 m), weight 94.3 kg (208 lb), SpO2 99 %.  General: Pt awake/alert/oriented x4 in no major acute distress.  Sitting up in chair, eating.  Smiling Eyes: PERRL, normal EOM. Sclera nonicteric Neuro: CN II-XII intact w/o focal sensory/motor deficits. Lymph: No head/neck/groin lymphadenopathy Psych:  No delerium/psychosis/paranoia HENT: Normocephalic, Mucus membranes moist.  No thrush Neck: Supple, No tracheal deviation Chest: No pain.  Good respiratory excursion. CV:  Pulses intact.  Regular rhythm MS: Normal AROM mjr joints.  No obvious deformity Abdomen: Soft, Nondistended.  Min tender at incisions only.  No incarcerated hernias.  Abd drain serosanguinous (being removed) Ext:  SCDs BLE.  No significant edema.  No cyanosis Skin: No petechiae / purpura  Discharged Condition: good   Past Medical History:  Diagnosis Date  . Adenomatous colon polyp 09/21/2014  . Anemia in chronic illness 03/07/2015   being evaluated  . Arthritis   . Bilateral hearing loss 03/15/2015  . Chronic kidney disease   . Chronic kidney disease (CKD) 03/07/2015  . GERD (gastroesophageal reflux disease)   . Glaucoma   . Hypercholesterolemia   . Hypertension   . Iron deficiency anemia 03/15/2015  . Prostate enlargement   . Skin lesion of left leg 03/07/2015    Past Surgical History:  Procedure Laterality Date  . colonoscopy    . EUS N/A 07/05/2015    Procedure: UPPER ENDOSCOPIC ULTRASOUND (EUS) LINEAR;  Surgeon: Milus Banister, MD;  Location: [facility];  Service: Endoscopy;  Laterality: N/A;  . KNEE ARTHROSCOPY WITH LATERAL MENISECTOMY Right 01/21/2013   Procedure: KNEE ARTHROSCOPY WITH PARTIAL LATERAL MENISECTOMY;  Surgeon: Carole Civil, MD;  Location: [facility];  Service: Orthopedics;  Laterality: Right;  . ORIF ANKLE DISLOCATION Right   . ROBOTIC ASSITED PARTIAL NEPHRECTOMY Left 10/17/2015   Procedure: XI ROBOTIC ASSITED LEFT PARTIAL NEPHRECTOMY, Left Cyst Decortication, Left Renal Ultrasound;  Surgeon: [name], MD;  Location: [facility];  Service: Urology;  Laterality: Left;    Social History   Social History  . Marital status: Divorced    Spouse name: N/A  . Number of children: N/A  . Years of education: N/A   Occupational History  . Not on file.   Social History Main Topics  . Smoking status: Former Smoker    Packs/day: 1.00    Years: 20.00    Types: Cigarettes    Quit date: 01/19/1981  . Smokeless tobacco: Never Used  . Alcohol use No  . Drug use: No  . 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 Not on file   Social History Narrative  . No narrative on file  History reviewed. No pertinent family history.  Current Facility-Administered Medications  Medication Dose Route Frequency Provider Last Rate Last Dose  . acetaminophen (TYLENOL) tablet 1,000 mg  1,000 mg Oral TID [name], MD   1,000 mg at 10/20/15 2133  . albuterol (PROVENTIL) (2.5 MG/3ML) 0.083% nebulizer solution 2.5 mg  2.5 mg Nebulization Q6H PRN [name], MD      . alum & mag hydroxide-simeth (MAALOX/MYLANTA) 200-200-20 MG/5ML suspension 30 mL  30 mL Oral Q6H PRN [name], MD      . aspirin EC tablet 81 mg  81 mg Oral Daily [name], MD   81 mg at 10/20/15 1021  . bisacodyl (DULCOLAX) suppository 10 mg  10 mg  Rectal Q12H PRN [name], MD      . diphenhydrAMINE (BENADRYL) injection 12.5-25 mg  12.5-25 mg Intravenous Q6H PRN [name], MD      . dorzolamide (TRUSOPT) 2 % ophthalmic solution 1 drop  1 drop Both Eyes BID [name], MD   1 drop at 10/20/15 2134  . enoxaparin (LOVENOX) injection 40 mg  40 mg Subcutaneous Q24H [name], MD   40 mg at 10/21/15 0740  . feeding supplement (ENSURE ENLIVE) (ENSURE ENLIVE) liquid 237 mL  237 mL Oral BID BM [name], MD   237 mL at 10/20/15 1400  . finasteride (PROSCAR) tablet 5 mg  5 mg Oral Daily [name], MD   5 mg at 10/20/15 1026  . hydrALAZINE (APRESOLINE) injection 10 mg  10 mg Intravenous Q2H PRN [name], MD      . HYDROmorphone (DILAUDID) injection 0.5-2 mg  0.5-2 mg Intravenous Q2H PRN [name], MD   1 mg at 10/19/15 2114  . lactated ringers bolus 1,000 mL  1,000 mL Intravenous Q8H PRN [name], MD      . latanoprost (XALATAN) 0.005 % ophthalmic solution 1 drop  1 drop Both Eyes QHS [name], MD   1 drop at 10/20/15 2134  . lip balm (CARMEX) ointment 1 application  1 application Topical BID [name], MD   1 application at [id number]  . lisinopril (PRINIVIL,ZESTRIL) tablet 20 mg  20 mg Oral Daily [name], MD   20 mg at 10/20/15 1022  . magic mouthwash  15 mL Oral QID PRN [name], MD      . menthol-cetylpyridinium (CEPACOL) lozenge 3 mg  1 lozenge Oral PRN [name], MD      . methocarbamol (ROBAXIN) 1,000 mg in dextrose 5 % 50 mL IVPB  1,000 mg Intravenous Q6H PRN [name], MD      . methocarbamol (ROBAXIN) tablet 1,000 mg  1,000 mg Oral Q6H PRN [name], MD      . metoprolol (LOPRESSOR) injection 5 mg  5 mg Intravenous Q6H PRN [name], MD      . metoprolol tartrate (LOPRESSOR) tablet 12.5 mg  12.5 mg Oral Q12H PRN [name], MD      . multivitamin with minerals tablet 1 tablet  1 tablet Oral Daily [name], MD   1 tablet at 10/20/15 1026  . ondansetron (ZOFRAN) injection 4 mg  4  mg Intravenous Q6H PRN [name], MD       Or  . ondansetron (ZOFRAN) 8 mg in sodium chloride 0.9 % 50 mL IVPB  8 mg Intravenous Q6H PRN [name], MD      . ondansetron (ZOFRAN-ODT) disintegrating tablet 4-8 mg  4-8 mg Oral Q6H PRN [name], MD      .  oxyCODONE (Oxy IR/ROXICODONE) immediate release tablet 5-10 mg  5-10 mg Oral Q4H PRN [name], MD      . pantoprazole (PROTONIX) EC tablet 80 mg  80 mg Oral Daily [name], MD   80 mg at 10/20/15 1021  . phenol (CHLORASEPTIC) mouth spray 2 spray  2 spray Mouth/Throat PRN [name], MD      . prochlorperazine (COMPAZINE) injection 5-10 mg  5-10 mg Intravenous Q4H PRN [name], MD      . simethicone [facility]) chewable tablet 40 mg  40 mg Oral Q6H PRN [name], MD      . simvastatin (ZOCOR) tablet 20 mg  20 mg Oral QHS [name], MD   20 mg at 10/20/15 2133  . tamsulosin (FLOMAX) capsule 0.4 mg  0.4 mg Oral Daily [name], MD   0.4 mg at 10/20/15 1021     No Known Allergies  Disposition: 01-Home or Self Care  Discharge Instructions    Call MD for:    Complete by:  As directed   Temperature > 101.23F   Call MD for:    Complete by:  As directed   Temperature > 101.23F   Call MD for:  extreme fatigue    Complete by:  As directed   Call MD for:  extreme fatigue    Complete by:  As directed   Call MD for:  hives    Complete by:  As directed   Call MD for:  hives    Complete by:  As directed   Call MD for:  persistant nausea and vomiting    Complete by:  As directed   Call MD for:  persistant nausea and vomiting    Complete by:  As directed   Call MD for:  redness, tenderness, or signs of infection (pain, swelling, redness, odor or green/yellow discharge around incision site)    Complete by:  As directed   Call MD for:  redness, tenderness, or signs of infection (pain, swelling, redness, odor or green/yellow discharge around incision site)    Complete by:  As directed   Call MD for:  severe uncontrolled pain     Complete by:  As directed   Call MD for:  severe uncontrolled pain    Complete by:  As directed   Diet - low sodium heart healthy    Complete by:  As directed   Start with bland, low residue diet for a few days, then advance to a heart healthy (low fat, high fiber) diet.  If you feel nauseated or constipated, simplify to a liquid only diet for 48 hours until you are feeling better (no more nausea, farting/passing gas, having a bowel movement, etc...).  If you cannot tolerate even drinking liquids, or feeling worse, let your surgeon know or go to the Emergency Department for help.   Diet - low sodium heart healthy    Complete by:  As directed   Start with bland, low residue diet for a few days, then advance to a heart healthy (low fat, high fiber) diet.  If you feel nauseated or constipated, simplify to a liquid only diet for 48 hours until you are feeling better (no more nausea, farting/passing gas, having a bowel movement, etc...).  If you cannot tolerate even drinking liquids, or feeling worse, let your surgeon know or go to the Emergency Department for help.   Discharge instructions    Complete by:  As directed   Please see discharge instruction sheets.  Also refer to any handouts/printouts that may have been given from the [facility] surgery office (if you visited [place] there before surgery) Please call our office if you have any questions or concerns [phone]   Discharge instructions    Complete by:  As directed   Please see discharge instruction sheets.   Also refer to any handouts/printouts that may have been given from the [facility] surgery office (if you visited [place] there before surgery) Please call our office if you have any questions or concerns [phone]   Discharge wound care:    Complete by:  As directed   If you have closed incisions: Shower and bathe over these incisions with soap and water every day.  It is OK to wash over the dressings: they are waterproof. Remove all surgical dressings  on postoperative day #3.  You do not need to replace dressings over the closed incisions unless you feel more comfortable with a Band-Aid covering it.   If you have an open wound: That requires packing, so please see wound care instructions.   In general, remove all dressings, wash wound with soap and water and then replace with saline moistened gauze.  Do the dressing change at least every day.    Please call our office [phone] if you have further questions.   Discharge wound care:    Complete by:  As directed   If you have closed incisions: Shower and bathe over these incisions with soap and water every day.  It is OK to wash over the dressings: they are waterproof. Remove all surgical dressings on postoperative day #3.  You do not need to replace dressings over the closed incisions unless you feel more comfortable with a Band-Aid covering it.    Please call our office [phone] if you have further questions.   Driving Restrictions    Complete by:  As directed   No driving until off narcotics and can safely swerve away without pain during an emergency   Driving Restrictions    Complete by:  As directed   No driving until off narcotics and can safely swerve away without pain during an emergency   Increase activity slowly    Complete by:  As directed   Increase activity slowly    Complete by:  As directed   Lifting restrictions    Complete by:  As directed   Avoid heavy lifting initially, <20 pounds at first.   Do not push through pain.   You have no specific weight limit: If it hurts to do, DON'T DO IT.    If you feel no pain, you are not injuring anything.  Pain will protect you from injury.   Coughing and sneezing are far more stressful to your incision than any lifting.   Avoid resuming heavy lifting (>50 pounds) or other intense activity until off all narcotic pain medications.   When want to exercise more, give yourself 2 weeks to gradually get back to full intense  exercise/activity.   Lifting restrictions    Complete by:  As directed   Avoid heavy lifting initially, <20 pounds at first.   Do not push through pain.   You have no specific weight limit: If it hurts to do, DON'T DO IT.    If you feel no pain, you are not injuring anything.  Pain will protect you from injury.   Coughing and sneezing are far more stressful to your incision than any lifting.   Avoid resuming heavy lifting (>  50 pounds) or other intense activity until off all narcotic pain medications.   When want to exercise more, give yourself 2 weeks to gradually get back to full intense exercise/activity.   May shower / Bathe    Complete by:  As directed   [place].  It is fine for dressings or wounds to be washed/rinsed.  Use gentle soap & water.  This will help the incisions and/or wounds get clean & minimize infection.   May shower / Bathe    Complete by:  As directed   [place].  It is fine for dressings or wounds to be washed/rinsed.  Use gentle soap & water.  This will help the incisions and/or wounds get clean & minimize infection.   May walk up steps    Complete by:  As directed   May walk up steps    Complete by:  As directed   Sexual Activity Restrictions    Complete by:  As directed   Sexual activity as tolerated.  Do not push through pain.  Pain will protect you from injury.   Sexual Activity Restrictions    Complete by:  As directed   Sexual activity as tolerated.  Do not push through pain.  Pain will protect you from injury.   Walk with assistance    Complete by:  As directed   Walk over an hour a day.  May use a walker/cane/companion to help with balance and stamina.   Walk with assistance    Complete by:  As directed   Walk over an hour a day.  May use a walker/cane/companion to help with balance and stamina.       Medication List    TAKE these medications   ARTIFICIAL TEAR OINTMENT OP Place 1 application into both eyes daily as needed (For dry  eyes.).   aspirin EC 81 MG tablet Take 81 mg by mouth daily.   dorzolamide 2 % ophthalmic solution Commonly known as:  TRUSOPT Place 1 drop into both eyes 2 (two) times daily.   ferrous sulfate 325 (65 FE) MG tablet Take 325 mg by mouth daily with breakfast.   finasteride 5 MG tablet Commonly known as:  PROSCAR Take 5 mg by mouth daily.   FISH OIL PO Take 4 capsules by mouth daily.   lisinopril 20 MG tablet Commonly known as:  PRINIVIL,ZESTRIL Take 20 mg by mouth every morning.   multivitamins ther. w/minerals Tabs tablet Take 1 tablet by mouth daily.   omeprazole 20 MG capsule Commonly known as:  PRILOSEC Take 20 mg by mouth daily.   simvastatin 20 MG tablet Commonly known as:  ZOCOR Take 20 mg by mouth at bedtime.   tamsulosin 0.4 MG Caps capsule Commonly known as:  FLOMAX Take 1 capsule (0.4 mg total) by mouth daily.   traMADol 50 MG tablet Commonly known as:  ULTRAM Take 1-2 tablets (50-100 mg total) by mouth every 6 (six) hours as needed for moderate pain or severe pain.   travoprost (benzalkonium) 0.004 % ophthalmic solution Commonly known as:  TRAVATAN Place 1 drop into both eyes at bedtime.      Follow-up Information    Alanah Sakuma C., MD. Schedule an appointment as soon as possible for a visit in 2 week(s).   Specialty:  General Surgery Why:  To follow up after your operation, To follow up after your hospital stay Contact information: [place] [address] [phone]  [name], MD Follow up on 11/01/2015.   Specialty:  Urology Why:  at 11:15 Contact information: [place] [address] [phone]            Signed: Morton Peters, M.D., F.A.C.S. Gastrointestinal and Minimally Invasive Surgery [facility] [address] [place], [place] [address] [phone] Main / Paging   10/21/2015, 8:08 AM

## 2015-10-21 NOTE — Progress Notes (Signed)
Nurse reviewed discharge information with pt. Pt verbalized understanding of discharge instructions, follow up appointments and new medications.  Pt's dressing from drain removal was changed prior to discharge.  No concerns at time of discharge.

## 2015-11-01 DIAGNOSIS — R350 Frequency of micturition: Secondary | ICD-10-CM | POA: Diagnosis not present

## 2015-11-07 ENCOUNTER — Other Ambulatory Visit: Payer: Self-pay | Admitting: Hematology and Oncology

## 2015-11-07 DIAGNOSIS — C642 Malignant neoplasm of left kidney, except renal pelvis: Secondary | ICD-10-CM

## 2015-11-07 DIAGNOSIS — C49A3 Gastrointestinal stromal tumor of small intestine: Secondary | ICD-10-CM

## 2015-11-08 ENCOUNTER — Other Ambulatory Visit (HOSPITAL_BASED_OUTPATIENT_CLINIC_OR_DEPARTMENT_OTHER): Payer: Medicare HMO

## 2015-11-08 ENCOUNTER — Telehealth: Payer: Self-pay | Admitting: Hematology and Oncology

## 2015-11-08 ENCOUNTER — Encounter: Payer: Self-pay | Admitting: Hematology and Oncology

## 2015-11-08 ENCOUNTER — Ambulatory Visit (HOSPITAL_BASED_OUTPATIENT_CLINIC_OR_DEPARTMENT_OTHER): Payer: Medicare HMO | Admitting: Hematology and Oncology

## 2015-11-08 VITALS — BP 111/52 | HR 68 | Temp 98.5°F | Resp 18 | Wt 213.5 lb

## 2015-11-08 DIAGNOSIS — N183 Chronic kidney disease, stage 3 unspecified: Secondary | ICD-10-CM

## 2015-11-08 DIAGNOSIS — D631 Anemia in chronic kidney disease: Secondary | ICD-10-CM

## 2015-11-08 DIAGNOSIS — C642 Malignant neoplasm of left kidney, except renal pelvis: Secondary | ICD-10-CM

## 2015-11-08 DIAGNOSIS — C49A3 Gastrointestinal stromal tumor of small intestine: Secondary | ICD-10-CM | POA: Diagnosis not present

## 2015-11-08 DIAGNOSIS — D638 Anemia in other chronic diseases classified elsewhere: Secondary | ICD-10-CM

## 2015-11-08 DIAGNOSIS — Z85528 Personal history of other malignant neoplasm of kidney: Secondary | ICD-10-CM | POA: Diagnosis not present

## 2015-11-08 DIAGNOSIS — D509 Iron deficiency anemia, unspecified: Secondary | ICD-10-CM

## 2015-11-08 LAB — COMPREHENSIVE METABOLIC PANEL
ALT: 26 U/L (ref 0–55)
AST: 19 U/L (ref 5–34)
Albumin: 3.2 g/dL — ABNORMAL LOW (ref 3.5–5.0)
Alkaline Phosphatase: 73 U/L (ref 40–150)
Anion Gap: 6 mEq/L (ref 3–11)
BUN: 36.7 mg/dL — ABNORMAL HIGH (ref 7.0–26.0)
CO2: 22 meq/L (ref 22–29)
Calcium: 9.2 mg/dL (ref 8.4–10.4)
Chloride: 110 mEq/L — ABNORMAL HIGH (ref 98–109)
Creatinine: 1.7 mg/dL — ABNORMAL HIGH (ref 0.7–1.3)
EGFR: 44 mL/min/{1.73_m2} — AB (ref >90)
GLUCOSE: 110 mg/dL (ref 70–140)
POTASSIUM: 4.8 meq/L (ref 3.5–5.1)
SODIUM: 138 meq/L (ref 136–145)
TOTAL PROTEIN: 7.2 g/dL (ref 6.4–8.3)
Total Bilirubin: 0.34 mg/dL (ref 0.20–1.20)

## 2015-11-08 LAB — CBC WITH DIFFERENTIAL/PLATELET
BASO%: 1.2 % (ref 0.0–2.0)
Basophils Absolute: 0.1 10*3/uL (ref 0.0–0.1)
EOS ABS: 0.3 10*3/uL (ref 0.0–0.5)
EOS%: 5.2 % (ref 0.0–7.0)
HCT: 30.6 % — ABNORMAL LOW (ref 38.4–49.9)
HGB: 10.2 g/dL — ABNORMAL LOW (ref 13.0–17.1)
LYMPH%: 26.3 % (ref 14.0–49.0)
MCH: 32.7 pg (ref 27.2–33.4)
MCHC: 33.3 g/dL (ref 32.0–36.0)
MCV: 98 fL (ref 79.3–98.0)
MONO#: 0.7 10*3/uL (ref 0.1–0.9)
MONO%: 10.9 % (ref 0.0–14.0)
NEUT%: 56.4 % (ref 39.0–75.0)
NEUTROS ABS: 3.4 10*3/uL (ref 1.5–6.5)
Platelets: 349 10*3/uL (ref 140–400)
RBC: 3.12 10*6/uL — AB (ref 4.20–5.82)
RDW: 14 % (ref 11.0–14.6)
WBC: 6.1 10*3/uL (ref 4.0–10.3)
lymph#: 1.6 10*3/uL (ref 0.9–3.3)

## 2015-11-08 NOTE — Assessment & Plan Note (Signed)
Surgical resection was complete. He does not require adjuvant treatment. He will continue close follow-up with urologist

## 2015-11-08 NOTE — Assessment & Plan Note (Signed)
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 He will continue medical management. His kidney function is stable after surgical resection

## 2015-11-08 NOTE — Assessment & Plan Note (Signed)
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 He may benefit from ESA in the future. I will check this in 1 year

## 2015-11-08 NOTE — Telephone Encounter (Signed)
Avs report and  appt schd given per  11/08/15 los °

## 2015-11-08 NOTE — Assessment & Plan Note (Signed)
His case was reviewed in the most recent GI tumor board. Overall, there is no data to support adjuvant Gleevec due to low mitotic index and small size of tumor, despite positive margin. I recommend close observation with repeat endoscopy in one year and CT imaging study in one year. I will see him back in 6 months for blood work, history and physical examination

## 2015-11-08 NOTE — Progress Notes (Signed)
[facility] PROGRESS NOTE  Patient Care Team: Zella Richer. 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       06/29/2015 Imaging    MRI abdomen showed 1.9 cm enhancing lesion along the lateral left lower kidney, corresponding to the CT abnormality, suspicious for solid renal neoplasm such as renal cell carcinoma      07/05/2015 Procedure    EUS confirmed 2.6 cm mass opposite opening of the ampulla      07/05/2015 Pathology Results    Accession: [id number] FNA was positive for GIST      10/17/2015 Surgery    He underwent combined stomach resection and kidney resection      10/17/2015 Pathology Results    Accession: [id number] pathology from stomach revealed 3.5 cm GIST with low mitotic rate but positive margin. 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 He returns for follow-up. He is recovering nicely after recent surgery. He denies pain.The patient denies any recent signs or symptoms of bleeding such as spontaneous epistaxis, hematuria or hematochezia.   REVIEW OF SYSTEMS:   Constitutional: Denies fevers, chills or abnormal weight loss Eyes: Denies blurriness of vision Ears, nose, mouth, throat, and face: Denies mucositis or sore throat Respiratory: Denies cough, dyspnea or wheezes Cardiovascular: Denies palpitation, chest discomfort or lower extremity swelling Gastrointestinal:  Denies nausea, heartburn or change in bowel habits Skin: Denies abnormal skin rashes Lymphatics: Denies new lymphadenopathy or easy bruising Neurological:Denies numbness, tingling or new weaknesses Behavioral/Psych: Mood is stable, no new changes  All other systems were reviewed with the patient and are negative.  I have reviewed the past medical history, past surgical history, social history and family history with the patient and they are unchanged from previous note.  ALLERGIES:  has No Known Allergies.  MEDICATIONS:  Current Outpatient Prescriptions  Medication Sig Dispense Refill  . ARTIFICIAL TEAR OINTMENT OP Place 1 application into both eyes daily as needed (For dry eyes.).    Marland Kitchen aspirin EC 81 MG tablet Take 81 mg by mouth daily.    . dorzolamide (TRUSOPT) 2 % ophthalmic solution Place 1 drop into both eyes 2 (two) times daily.     . ferrous sulfate 325 (65 FE) MG tablet Take 325 mg by mouth daily with  breakfast.    . finasteride (PROSCAR) 5 MG tablet Take 5 mg by mouth daily.  3  . lisinopril (PRINIVIL,ZESTRIL) 20 MG tablet Take 20 mg by mouth every morning.     . Multiple Vitamins-Minerals (MULTIVITAMINS THER. W/MINERALS) TABS tablet Take 1 tablet by mouth daily.    . Omega-3 Fatty Acids (FISH OIL PO) Take 4 capsules by mouth daily.    Marland Kitchen omeprazole  (PRILOSEC) 20 MG capsule Take 20 mg by mouth daily.    . simvastatin (ZOCOR) 20 MG tablet Take 20 mg by mouth at bedtime.     . tamsulosin (FLOMAX) 0.4 MG CAPS capsule Take 1 capsule (0.4 mg total) by mouth daily. 30 capsule 1  . traMADol (ULTRAM) 50 MG tablet Take 1-2 tablets (50-100 mg total) by mouth every 6 (six) hours as needed for moderate pain or severe pain. 30 tablet 0  . travoprost, benzalkonium, (TRAVATAN) 0.004 % ophthalmic solution Place 1 drop into both eyes at bedtime.      No current facility-administered medications for this visit.     PHYSICAL EXAMINATION: ECOG PERFORMANCE STATUS: 0 - Asymptomatic  Vitals:   11/08/15 1353  BP: (!) 111/52  Pulse: 68  Resp: 18  Temp: 98.5 F (36.9 C)   Filed Weights   11/08/15 1353  Weight: 213 lb 8 oz (96.8 kg)    GENERAL:alert, no distress and comfortable SKIN: skin color, texture, turgor are normal, no rashes or significant lesions EYES: normal, Conjunctiva are pink and non-injected, sclera clear ABDOMEN:abdomen soft, non-tender and normal bowel sounds. Well healed surgical scars Musculoskeletal:no cyanosis of digits and no clubbing  NEURO: alert & oriented x 3 with fluent speech, no focal motor/sensory deficits  LABORATORY DATA:  I have reviewed the data as listed    Component Value Date/Time   NA 138 11/08/2015 1329   K 4.8 11/08/2015 1329   CL 105 10/18/2015 0442   CO2 22 11/08/2015 1329   GLUCOSE 110 11/08/2015 1329   BUN 36.7 (H) 11/08/2015 1329   CREATININE 1.7 (H) 11/08/2015 1329   CALCIUM 9.2 11/08/2015 1329   PROT 7.2 11/08/2015 1329   ALBUMIN 3.2 (L) 11/08/2015 1329   AST 19 11/08/2015 1329   ALT 26 11/08/2015 1329   ALKPHOS 73 11/08/2015 1329   BILITOT 0.34 11/08/2015 1329   GFRNONAA 45 (L) 10/18/2015 0442   GFRAA 52 (L) 10/18/2015 0442    No results found for: SPEP, UPEP  Lab Results  Component Value Date   WBC 6.1 11/08/2015   NEUTROABS 3.4 11/08/2015   HGB 10.2 (L) 11/08/2015   HCT 30.6 (L)  11/08/2015   MCV 98.0 11/08/2015   PLT 349 11/08/2015      Chemistry      Component Value Date/Time   NA 138 11/08/2015 1329   K 4.8 11/08/2015 1329   CL 105 10/18/2015 0442   CO2 22 11/08/2015 1329   BUN 36.7 (H) 11/08/2015 1329   CREATININE 1.7 (H) 11/08/2015 1329      Component Value Date/Time   CALCIUM 9.2 11/08/2015 1329   ALKPHOS 73 11/08/2015 1329   AST 19 11/08/2015 1329   ALT 26 11/08/2015 1329   BILITOT 0.34 11/08/2015 1329      ASSESSMENT & PLAN:  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 Overall, there is no data to support adjuvant Gleevec due to low mitotic index  and small size of tumor, despite positive margin. I recommend close observation with repeat endoscopy in one year and CT imaging study in one year. 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 He does not require adjuvant treatment. 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 He will continue medical management. 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 He may benefit from ESA in the future. I will check this in 1 year   Orders Placed This Encounter  Procedures  . CBC & Diff and Retic    Standing Status:   Future    Standing Expiration Date:   12/12/2016  . Comprehensive metabolic panel    Standing Status:   Future    Standing Expiration Date:   12/12/2016  . Ferritin    Standing Status:   Future    Standing Expiration Date:   12/12/2016  . Iron and TIBC     Standing Status:   Future    Standing Expiration Date:   12/12/2016   All questions were answered. The patient knows to call the clinic with any problems, questions or concerns. No barriers to learning was detected. I spent 15 minutes counseling the patient face to face. The total time spent in the appointment was 20 minutes and more than 50% was on counseling and review of test results     [facility], [place], MD 11/08/2015 2:49 PM

## 2015-12-31 DIAGNOSIS — H401133 Primary open-angle glaucoma, bilateral, severe stage: Secondary | ICD-10-CM | POA: Diagnosis not present

## 2015-12-31 DIAGNOSIS — H04123 Dry eye syndrome of bilateral lacrimal glands: Secondary | ICD-10-CM | POA: Diagnosis not present

## 2015-12-31 DIAGNOSIS — H47233 Glaucomatous optic atrophy, bilateral: Secondary | ICD-10-CM | POA: Diagnosis not present

## 2016-01-10 DIAGNOSIS — H401123 Primary open-angle glaucoma, left eye, severe stage: Secondary | ICD-10-CM | POA: Diagnosis not present

## 2016-01-10 DIAGNOSIS — H401113 Primary open-angle glaucoma, right eye, severe stage: Secondary | ICD-10-CM | POA: Diagnosis not present

## 2016-01-16 DIAGNOSIS — R972 Elevated prostate specific antigen [PSA]: Secondary | ICD-10-CM | POA: Diagnosis not present

## 2016-02-12 ENCOUNTER — Ambulatory Visit (INDEPENDENT_AMBULATORY_CARE_PROVIDER_SITE_OTHER): Payer: Medicare HMO | Admitting: Urology

## 2016-02-12 DIAGNOSIS — R972 Elevated prostate specific antigen [PSA]: Secondary | ICD-10-CM | POA: Diagnosis not present

## 2016-02-12 DIAGNOSIS — N400 Enlarged prostate without lower urinary tract symptoms: Secondary | ICD-10-CM

## 2016-02-12 DIAGNOSIS — N5201 Erectile dysfunction due to arterial insufficiency: Secondary | ICD-10-CM

## 2016-02-12 DIAGNOSIS — C642 Malignant neoplasm of left kidney, except renal pelvis: Secondary | ICD-10-CM

## 2016-02-18 DIAGNOSIS — H04121 Dry eye syndrome of right lacrimal gland: Secondary | ICD-10-CM | POA: Diagnosis not present

## 2016-03-05 DIAGNOSIS — Z0000 Encounter for general adult medical examination without abnormal findings: Secondary | ICD-10-CM | POA: Diagnosis not present

## 2016-03-05 DIAGNOSIS — D649 Anemia, unspecified: Secondary | ICD-10-CM | POA: Diagnosis not present

## 2016-03-05 DIAGNOSIS — N183 Chronic kidney disease, stage 3 (moderate): Secondary | ICD-10-CM | POA: Diagnosis not present

## 2016-03-05 DIAGNOSIS — E7800 Pure hypercholesterolemia, unspecified: Secondary | ICD-10-CM | POA: Diagnosis not present

## 2016-03-05 DIAGNOSIS — I87329 Chronic venous hypertension (idiopathic) with inflammation of unspecified lower extremity: Secondary | ICD-10-CM | POA: Diagnosis not present

## 2016-03-05 DIAGNOSIS — Z1211 Encounter for screening for malignant neoplasm of colon: Secondary | ICD-10-CM | POA: Diagnosis not present

## 2016-03-05 DIAGNOSIS — I10 Essential (primary) hypertension: Secondary | ICD-10-CM | POA: Diagnosis not present

## 2016-03-05 DIAGNOSIS — Z6829 Body mass index (BMI) 29.0-29.9, adult: Secondary | ICD-10-CM | POA: Diagnosis not present

## 2016-03-05 DIAGNOSIS — N182 Chronic kidney disease, stage 2 (mild): Secondary | ICD-10-CM | POA: Diagnosis not present

## 2016-03-06 DIAGNOSIS — H04123 Dry eye syndrome of bilateral lacrimal glands: Secondary | ICD-10-CM | POA: Diagnosis not present

## 2016-03-06 DIAGNOSIS — H04122 Dry eye syndrome of left lacrimal gland: Secondary | ICD-10-CM | POA: Diagnosis not present

## 2016-03-06 DIAGNOSIS — H401133 Primary open-angle glaucoma, bilateral, severe stage: Secondary | ICD-10-CM | POA: Diagnosis not present

## 2016-05-08 ENCOUNTER — Telehealth: Payer: Self-pay | Admitting: Hematology and Oncology

## 2016-05-08 ENCOUNTER — Ambulatory Visit (HOSPITAL_BASED_OUTPATIENT_CLINIC_OR_DEPARTMENT_OTHER): Payer: Medicare HMO | Admitting: Hematology and Oncology

## 2016-05-08 ENCOUNTER — Other Ambulatory Visit (HOSPITAL_BASED_OUTPATIENT_CLINIC_OR_DEPARTMENT_OTHER): Payer: Medicare HMO

## 2016-05-08 VITALS — BP 144/59 | HR 70 | Temp 97.9°F | Resp 17 | Ht 74.0 in | Wt 220.2 lb

## 2016-05-08 DIAGNOSIS — C49A3 Gastrointestinal stromal tumor of small intestine: Secondary | ICD-10-CM | POA: Diagnosis not present

## 2016-05-08 DIAGNOSIS — Z85528 Personal history of other malignant neoplasm of kidney: Secondary | ICD-10-CM

## 2016-05-08 DIAGNOSIS — D509 Iron deficiency anemia, unspecified: Secondary | ICD-10-CM | POA: Diagnosis not present

## 2016-05-08 DIAGNOSIS — D638 Anemia in other chronic diseases classified elsewhere: Secondary | ICD-10-CM | POA: Diagnosis not present

## 2016-05-08 DIAGNOSIS — C642 Malignant neoplasm of left kidney, except renal pelvis: Secondary | ICD-10-CM

## 2016-05-08 LAB — CBC & DIFF AND RETIC
BASO%: 0.7 % (ref 0.0–2.0)
Basophils Absolute: 0 10*3/uL (ref 0.0–0.1)
EOS%: 3.7 % (ref 0.0–7.0)
Eosinophils Absolute: 0.2 10*3/uL (ref 0.0–0.5)
HCT: 32.6 % — ABNORMAL LOW (ref 38.4–49.9)
HGB: 11.1 g/dL — ABNORMAL LOW (ref 13.0–17.1)
IMMATURE RETIC FRACT: 15.3 % — AB (ref 3.00–10.60)
LYMPH%: 26.3 % (ref 14.0–49.0)
MCH: 32.8 pg (ref 27.2–33.4)
MCHC: 34 g/dL (ref 32.0–36.0)
MCV: 96.4 fL (ref 79.3–98.0)
MONO#: 0.6 10*3/uL (ref 0.1–0.9)
MONO%: 11.1 % (ref 0.0–14.0)
NEUT%: 58.2 % (ref 39.0–75.0)
NEUTROS ABS: 3.1 10*3/uL (ref 1.5–6.5)
PLATELETS: 247 10*3/uL (ref 140–400)
RBC: 3.38 10*6/uL — AB (ref 4.20–5.82)
RDW: 14.7 % — ABNORMAL HIGH (ref 11.0–14.6)
Retic %: 1.54 % (ref 0.80–1.80)
Retic Ct Abs: 52.05 10*3/uL (ref 34.80–93.90)
WBC: 5.4 10*3/uL (ref 4.0–10.3)
lymph#: 1.4 10*3/uL (ref 0.9–3.3)

## 2016-05-08 LAB — COMPREHENSIVE METABOLIC PANEL
ALT: 23 U/L (ref 0–55)
ANION GAP: 8 meq/L (ref 3–11)
AST: 26 U/L (ref 5–34)
Albumin: 3.6 g/dL (ref 3.5–5.0)
Alkaline Phosphatase: 81 U/L (ref 40–150)
BUN: 23.5 mg/dL (ref 7.0–26.0)
CHLORIDE: 110 meq/L — AB (ref 98–109)
CO2: 23 meq/L (ref 22–29)
Calcium: 9.5 mg/dL (ref 8.4–10.4)
Creatinine: 1.6 mg/dL — ABNORMAL HIGH (ref 0.7–1.3)
EGFR: 48 mL/min/{1.73_m2} — AB (ref >90)
GLUCOSE: 87 mg/dL (ref 70–140)
POTASSIUM: 4.2 meq/L (ref 3.5–5.1)
SODIUM: 141 meq/L (ref 136–145)
Total Bilirubin: 0.29 mg/dL (ref 0.20–1.20)
Total Protein: 7.2 g/dL (ref 6.4–8.3)

## 2016-05-08 LAB — IRON AND TIBC
%SAT: 36 % (ref 20–55)
IRON: 100 ug/dL (ref 42–163)
TIBC: 275 ug/dL (ref 202–409)
UIBC: 176 ug/dL (ref 117–376)

## 2016-05-08 LAB — FERRITIN: Ferritin: 49 ng/ml (ref 22–316)

## 2016-05-08 NOTE — Telephone Encounter (Signed)
Appointments scheduled per 05/08/16 los. Patient was given a copy of the AVS report and appointment schedule per 05/08/16 los. °

## 2016-05-09 ENCOUNTER — Encounter: Payer: Self-pay | Admitting: Hematology and Oncology

## 2016-05-09 NOTE — Assessment & Plan Note (Signed)
His case was reviewed in the most recent GI tumor board. Overall, there is no data to support adjuvant Gleevec due to low mitotic index and small size of tumor, despite positive margin. I recommend close observation with repeat endoscopy in one year and CT imaging study in one year, due in August 2018 I will see him back in 5 months for blood work, history and physical examination along with imaging study

## 2016-05-09 NOTE — Assessment & Plan Note (Signed)
Surgical resection was complete. He does not require adjuvant treatment. He will continue close follow-up with urologist As above, I plan to repeat imaging study and we will stage his kidney disease as well.

## 2016-05-09 NOTE — Progress Notes (Signed)
[place] OFFICE PROGRESS NOTE  Patient Care Team: Zella Richer. 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       06/29/2015 Imaging    MRI abdomen showed 1.9 cm enhancing lesion along the lateral left lower kidney, corresponding to the CT abnormality, suspicious for solid renal neoplasm such as renal cell carcinoma      07/05/2015 Procedure    EUS confirmed 2.6 cm mass opposite opening of the ampulla      07/05/2015 Pathology Results    Accession: [id number] FNA was positive for GIST      10/17/2015 Surgery    He underwent combined stomach resection and kidney resection      10/17/2015 Pathology Results    Accession: [id number] pathology from stomach revealed 3.5 cm GIST with low mitotic rate but positive margin. He also has papillary cell carcinoma involving the kidney       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/17/2015   10/17/2015 Pathology Results    Accession: [id number] kidney pathology 1.9 cm papillary carcinoma      10/19/2015 Initial Diagnosis    Renal cell carcinoma of left kidney s/p partial  nephrectomy 10/17/2015      INTERVAL HISTORY: Please see below for problem oriented charting. He returns for follow-up. He feels well. The patient denies any recent signs or symptoms of bleeding such as spontaneous epistaxis, hematuria or hematochezia. He denies abdominal pain.  No recent hematuria  REVIEW OF SYSTEMS:   Constitutional: Denies fevers, chills or abnormal weight loss Eyes: Denies blurriness of vision Ears, nose, mouth, throat, and face: Denies mucositis or sore throat Respiratory: Denies cough, dyspnea or wheezes Cardiovascular: Denies palpitation, chest discomfort or lower extremity swelling Gastrointestinal:  Denies nausea, heartburn or change in bowel habits Skin: Denies abnormal skin rashes Lymphatics: Denies new lymphadenopathy or easy bruising Neurological:Denies numbness, tingling or new weaknesses Behavioral/Psych: Mood is stable, no new changes  All other systems were reviewed with the patient and are negative.  I have reviewed the past medical history, past surgical history, social history and family history with the patient and they are unchanged from previous note.  ALLERGIES:  has No Known Allergies.  MEDICATIONS:  Current Outpatient Prescriptions  Medication Sig Dispense Refill  . ARTIFICIAL TEAR OINTMENT OP Place 1 application into both eyes daily as needed (For dry eyes.).    Marland Kitchen aspirin EC 81 MG tablet Take 81 mg by mouth daily.    . Cyanocobalamin (VITAMIN B 12 PO) Take by mouth.    . dorzolamide (TRUSOPT) 2 % ophthalmic solution Place 1 drop into both eyes 2 (two) times daily.     . ferrous  sulfate 325 (65 FE) MG tablet Take 325 mg by mouth daily with breakfast.    . finasteride (PROSCAR) 5 MG tablet Take 5 mg by mouth daily.  3  . lisinopril (PRINIVIL,ZESTRIL) 20 MG tablet Take 20 mg by mouth every morning.     . Multiple Vitamins-Minerals (MULTIVITAMINS THER. W/MINERALS) TABS tablet Take 1 tablet by mouth daily.    . Omega-3 Fatty Acids (FISH OIL  PO) Take 4 capsules by mouth daily.    Marland Kitchen omeprazole (PRILOSEC) 20 MG capsule Take 20 mg by mouth daily.    . simvastatin (ZOCOR) 20 MG tablet Take 20 mg by mouth at bedtime.     . tamsulosin (FLOMAX) 0.4 MG CAPS capsule Take 1 capsule (0.4 mg total) by mouth daily. 30 capsule 1  . travoprost, benzalkonium, (TRAVATAN) 0.004 % ophthalmic solution Place 1 drop into both eyes at bedtime.     . traMADol (ULTRAM) 50 MG tablet Take 1-2 tablets (50-100 mg total) by mouth every 6 (six) hours as needed for moderate pain or severe pain. (Patient not taking: Reported on 05/08/2016) 30 tablet 0   No current facility-administered medications for this visit.     PHYSICAL EXAMINATION: ECOG PERFORMANCE STATUS: 0 - Asymptomatic  Vitals:   05/08/16 0903  BP: (!) 144/59  Pulse: 70  Resp: 17  Temp: 97.9 F (36.6 C)   Filed Weights   05/08/16 0903  Weight: 220 lb 3.2 oz (99.9 kg)    GENERAL:alert, no distress and comfortable SKIN: skin color, texture, turgor are normal, no rashes or significant lesions EYES: normal, Conjunctiva are pink and non-injected, sclera clear OROPHARYNX:no exudate, no erythema and lips, buccal mucosa, and tongue normal  NECK: supple, thyroid normal size, non-tender, without nodularity LYMPH:  no palpable lymphadenopathy in the cervical, axillary or inguinal LUNGS: clear to auscultation and percussion with normal breathing effort HEART: regular rate & rhythm and no murmurs and no lower extremity edema ABDOMEN:abdomen soft, non-tender and normal bowel sounds Musculoskeletal:no cyanosis of digits and no clubbing  NEURO: alert & oriented x 3 with fluent speech, no focal motor/sensory deficits  LABORATORY DATA:  I have reviewed the data as listed    Component Value Date/Time   NA 141 05/08/2016 0847   K 4.2 05/08/2016 0847   CL 105 10/18/2015 0442   CO2 23 05/08/2016 0847   GLUCOSE 87 05/08/2016 0847   BUN 23.5 05/08/2016 0847   CREATININE 1.6 (H) 05/08/2016 0847    CALCIUM 9.5 05/08/2016 0847   PROT 7.2 05/08/2016 0847   ALBUMIN 3.6 05/08/2016 0847   AST 26 05/08/2016 0847   ALT 23 05/08/2016 0847   ALKPHOS 81 05/08/2016 0847   BILITOT 0.29 05/08/2016 0847   GFRNONAA 45 (L) 10/18/2015 0442   GFRAA 52 (L) 10/18/2015 0442    No results found for: SPEP, UPEP  Lab Results  Component Value Date   WBC 5.4 05/08/2016   NEUTROABS 3.1 05/08/2016   HGB 11.1 (L) 05/08/2016   HCT 32.6 (L) 05/08/2016   MCV 96.4 05/08/2016   PLT 247 05/08/2016      Chemistry      Component Value Date/Time   NA 141 05/08/2016 0847   K 4.2 05/08/2016 0847   CL 105 10/18/2015 0442   CO2 23 05/08/2016 0847   BUN 23.5 05/08/2016 0847   CREATININE 1.6 (H) 05/08/2016 0847      Component Value Date/Time   CALCIUM 9.5 05/08/2016 0847   ALKPHOS 81 05/08/2016 0847   AST 26  05/08/2016 0847   ALT 23 05/08/2016 0847   BILITOT 0.29 05/08/2016 0847       ASSESSMENT & PLAN:  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 Overall, there is no data to support adjuvant Gleevec due to low mitotic index and small size of tumor, despite positive margin. 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 He does not require adjuvant treatment. He will continue close follow-up with urologist As above, I plan to repeat imaging study and we will stage his kidney disease as well.  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 His anemia is stable. He does not require any further workup or treatment now.   Orders Placed  This Encounter  Procedures  . CT ABDOMEN PELVIS W CONTRAST    Standing Status:   Future    Standing Expiration Date:   06/12/2017    Order Specific Question:   Reason for exam:    Answer:   staging renal cancer and GIST cancer s/p resesction, exclude recurrence    Order Specific Question:   Preferred imaging location?    Answer:   [facility]  . CT CHEST W CONTRAST    Standing Status:   Future    Standing Expiration Date:   06/12/2017    Order Specific Question:   Reason for exam:    Answer:   staging renal cancer and GIST cancer s/p resesction, exclude recurrence    Order Specific Question:   Preferred imaging location?    Answer:   [facility]  . CBC with Differential/Platelet    Standing Status:   Future    Standing Expiration Date:   06/12/2017  . Comprehensive metabolic panel    Standing Status:   Future    Standing Expiration Date:   06/12/2017   All questions were answered. The patient knows to call the clinic with any problems, questions or concerns. No barriers to learning was detected. I spent 15 minutes counseling the patient face to face. The total time spent in the appointment was 20 minutes and more than 50% was on counseling and review of test results     [name], MD 05/09/2016 7:53 AM

## 2016-05-09 NOTE — Assessment & Plan Note (Signed)
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 His anemia is stable. He does not require any further workup or treatment now. 

## 2016-07-21 ENCOUNTER — Encounter: Payer: Self-pay | Admitting: Orthopedic Surgery

## 2016-08-21 DIAGNOSIS — H401133 Primary open-angle glaucoma, bilateral, severe stage: Secondary | ICD-10-CM | POA: Diagnosis not present

## 2016-08-21 DIAGNOSIS — H353132 Nonexudative age-related macular degeneration, bilateral, intermediate dry stage: Secondary | ICD-10-CM | POA: Diagnosis not present

## 2016-08-21 DIAGNOSIS — H25012 Cortical age-related cataract, left eye: Secondary | ICD-10-CM | POA: Diagnosis not present

## 2016-08-21 DIAGNOSIS — H2512 Age-related nuclear cataract, left eye: Secondary | ICD-10-CM | POA: Diagnosis not present

## 2016-08-21 DIAGNOSIS — H25013 Cortical age-related cataract, bilateral: Secondary | ICD-10-CM | POA: Diagnosis not present

## 2016-08-21 DIAGNOSIS — H2513 Age-related nuclear cataract, bilateral: Secondary | ICD-10-CM | POA: Diagnosis not present

## 2016-08-25 DIAGNOSIS — N183 Chronic kidney disease, stage 3 (moderate): Secondary | ICD-10-CM | POA: Diagnosis not present

## 2016-08-25 DIAGNOSIS — I10 Essential (primary) hypertension: Secondary | ICD-10-CM | POA: Diagnosis not present

## 2016-08-25 DIAGNOSIS — D649 Anemia, unspecified: Secondary | ICD-10-CM | POA: Diagnosis not present

## 2016-08-25 DIAGNOSIS — C49A3 Gastrointestinal stromal tumor of small intestine: Secondary | ICD-10-CM | POA: Diagnosis not present

## 2016-09-23 DIAGNOSIS — H2512 Age-related nuclear cataract, left eye: Secondary | ICD-10-CM | POA: Diagnosis not present

## 2016-09-23 DIAGNOSIS — H25812 Combined forms of age-related cataract, left eye: Secondary | ICD-10-CM | POA: Diagnosis not present

## 2016-09-29 ENCOUNTER — Ambulatory Visit: Payer: Medicare HMO | Admitting: Orthopedic Surgery

## 2016-10-13 DIAGNOSIS — H25011 Cortical age-related cataract, right eye: Secondary | ICD-10-CM | POA: Diagnosis not present

## 2016-10-13 DIAGNOSIS — H2511 Age-related nuclear cataract, right eye: Secondary | ICD-10-CM | POA: Diagnosis not present

## 2016-10-15 ENCOUNTER — Other Ambulatory Visit: Payer: Self-pay

## 2016-10-15 DIAGNOSIS — C49A3 Gastrointestinal stromal tumor of small intestine: Secondary | ICD-10-CM

## 2016-10-16 ENCOUNTER — Ambulatory Visit (HOSPITAL_COMMUNITY)
Admission: RE | Admit: 2016-10-16 | Discharge: 2016-10-16 | Disposition: A | Discharge status: Home / Self Care | Payer: Medicare HMO | Source: Ambulatory Visit | Attending: Hematology and Oncology | Admitting: Hematology and Oncology

## 2016-10-16 ENCOUNTER — Other Ambulatory Visit (HOSPITAL_BASED_OUTPATIENT_CLINIC_OR_DEPARTMENT_OTHER): Payer: Medicare HMO

## 2016-10-16 DIAGNOSIS — I313 Pericardial effusion (noninflammatory): Secondary | ICD-10-CM | POA: Diagnosis not present

## 2016-10-16 DIAGNOSIS — C49A3 Gastrointestinal stromal tumor of small intestine: Secondary | ICD-10-CM | POA: Diagnosis present

## 2016-10-16 DIAGNOSIS — K7689 Other specified diseases of liver: Secondary | ICD-10-CM | POA: Diagnosis not present

## 2016-10-16 DIAGNOSIS — C642 Malignant neoplasm of left kidney, except renal pelvis: Secondary | ICD-10-CM

## 2016-10-16 DIAGNOSIS — Z905 Acquired absence of kidney: Secondary | ICD-10-CM | POA: Diagnosis not present

## 2016-10-16 DIAGNOSIS — N400 Enlarged prostate without lower urinary tract symptoms: Secondary | ICD-10-CM | POA: Insufficient documentation

## 2016-10-16 DIAGNOSIS — N281 Cyst of kidney, acquired: Secondary | ICD-10-CM | POA: Diagnosis not present

## 2016-10-16 DIAGNOSIS — Z9049 Acquired absence of other specified parts of digestive tract: Secondary | ICD-10-CM | POA: Diagnosis not present

## 2016-10-16 LAB — CBC WITH DIFFERENTIAL/PLATELET
BASO%: 1.2 % (ref 0.0–2.0)
Basophils Absolute: 0.1 10*3/uL (ref 0.0–0.1)
EOS ABS: 0.2 10*3/uL (ref 0.0–0.5)
EOS%: 4.1 % (ref 0.0–7.0)
HCT: 34.5 % — ABNORMAL LOW (ref 38.4–49.9)
HGB: 11.7 g/dL — ABNORMAL LOW (ref 13.0–17.1)
LYMPH#: 1.1 10*3/uL (ref 0.9–3.3)
LYMPH%: 23.1 % (ref 14.0–49.0)
MCH: 33 pg (ref 27.2–33.4)
MCHC: 33.9 g/dL (ref 32.0–36.0)
MCV: 97.2 fL (ref 79.3–98.0)
MONO#: 0.5 10*3/uL (ref 0.1–0.9)
MONO%: 11.3 % (ref 0.0–14.0)
NEUT#: 2.9 10*3/uL (ref 1.5–6.5)
NEUT%: 60.3 % (ref 39.0–75.0)
PLATELETS: 227 10*3/uL (ref 140–400)
RBC: 3.55 10*6/uL — AB (ref 4.20–5.82)
RDW: 15.1 % — AB (ref 11.0–14.6)
WBC: 4.8 10*3/uL (ref 4.0–10.3)

## 2016-10-16 LAB — COMPREHENSIVE METABOLIC PANEL
ALBUMIN: 3.5 g/dL (ref 3.5–5.0)
ALK PHOS: 69 U/L (ref 40–150)
ALT: 16 U/L (ref 0–55)
ANION GAP: 6 meq/L (ref 3–11)
AST: 20 U/L (ref 5–34)
BILIRUBIN TOTAL: 0.44 mg/dL (ref 0.20–1.20)
BUN: 17.4 mg/dL (ref 7.0–26.0)
CO2: 24 mEq/L (ref 22–29)
Calcium: 9.6 mg/dL (ref 8.4–10.4)
Chloride: 110 mEq/L — ABNORMAL HIGH (ref 98–109)
Creatinine: 1.8 mg/dL — ABNORMAL HIGH (ref 0.7–1.3)
EGFR: 42 mL/min/{1.73_m2} — AB (ref >90)
Glucose: 96 mg/dl (ref 70–140)
Potassium: 4.1 mEq/L (ref 3.5–5.1)
Sodium: 140 mEq/L (ref 136–145)
TOTAL PROTEIN: 7.2 g/dL (ref 6.4–8.3)

## 2016-10-16 MED ORDER — IOPAMIDOL (ISOVUE-300) INJECTION 61%
INTRAVENOUS | Status: AC
  Filled 2016-10-16: qty 100

## 2016-10-16 MED ORDER — IOPAMIDOL (ISOVUE-300) INJECTION 61%
100.0000 mL | Freq: Once | INTRAVENOUS | Status: AC | PRN
  Administered 2016-10-16: 80 mL via INTRAVENOUS

## 2016-10-17 ENCOUNTER — Ambulatory Visit (HOSPITAL_BASED_OUTPATIENT_CLINIC_OR_DEPARTMENT_OTHER): Payer: Medicare HMO | Admitting: Hematology and Oncology

## 2016-10-17 ENCOUNTER — Telehealth: Payer: Self-pay

## 2016-10-17 ENCOUNTER — Encounter: Payer: Self-pay | Admitting: Hematology and Oncology

## 2016-10-17 VITALS — BP 137/65 | HR 60 | Temp 98.3°F | Resp 18 | Ht 74.0 in | Wt 221.9 lb

## 2016-10-17 DIAGNOSIS — D539 Nutritional anemia, unspecified: Secondary | ICD-10-CM

## 2016-10-17 DIAGNOSIS — D631 Anemia in chronic kidney disease: Secondary | ICD-10-CM

## 2016-10-17 DIAGNOSIS — D638 Anemia in other chronic diseases classified elsewhere: Secondary | ICD-10-CM | POA: Diagnosis not present

## 2016-10-17 DIAGNOSIS — D509 Iron deficiency anemia, unspecified: Secondary | ICD-10-CM | POA: Diagnosis not present

## 2016-10-17 DIAGNOSIS — C642 Malignant neoplasm of left kidney, except renal pelvis: Secondary | ICD-10-CM

## 2016-10-17 DIAGNOSIS — C49A3 Gastrointestinal stromal tumor of small intestine: Secondary | ICD-10-CM

## 2016-10-17 DIAGNOSIS — N189 Chronic kidney disease, unspecified: Secondary | ICD-10-CM | POA: Diagnosis not present

## 2016-10-17 NOTE — Progress Notes (Signed)
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       06/29/2015 Imaging    MRI abdomen showed 1.9 cm enhancing lesion along the lateral left lower kidney, corresponding to the CT abnormality, suspicious for solid renal neoplasm such as renal cell carcinoma      07/05/2015 Procedure    EUS confirmed 2.6 cm mass opposite opening of the ampulla      07/05/2015 Pathology Results    Accession: [id number] FNA was positive for GIST      10/17/2015 Surgery    He underwent combined stomach resection and kidney resection      10/17/2015 Pathology Results    Accession: [id number] pathology from stomach revealed 3.5 cm GIST with low mitotic rate but positive margin. He also has papillary cell carcinoma involving the kidney      10/16/2016 Imaging    1. Status post resection of a duodenum GIST. No findings for residual or recurrent tumor or metastatic disease. 2. Status post partial left nephrectomy for small renal cell carcinoma. No findings for recurrent tumor or metastatic  disease. 3. Numerous bilateral renal cysts, some of which are hyperdense/hemorrhagic. No worrisome renal lesions. 4. Markedly enlarged prostate gland. 5. Small pericardial effusion and scattered mediastinal lymph nodes but no mass or overt adenopathy. 6. Benign-appearing hepatic cysts.       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 He returns for further follow-up He is doing well Denies recent weight loss No abdominal pain Denies hematuria  REVIEW OF SYSTEMS:   Constitutional: Denies fevers, chills or abnormal weight loss Eyes: Denies blurriness of vision Ears, nose, mouth, throat, and face: Denies mucositis or sore throat Respiratory: Denies cough, dyspnea or wheezes Cardiovascular: Denies palpitation, chest discomfort or lower extremity swelling Gastrointestinal:  Denies nausea, heartburn or change in bowel habits Skin: Denies abnormal skin rashes Lymphatics: Denies new lymphadenopathy or easy bruising Neurological:Denies numbness, tingling or new weaknesses Behavioral/Psych: Mood is stable, no new changes  All other systems were reviewed with the patient and are negative.  I have reviewed the past medical history, past surgical history, social history and family history with the patient and they are unchanged from previous note.  ALLERGIES:  has No Known Allergies.  MEDICATIONS:  Current Outpatient Prescriptions  Medication Sig Dispense Refill  .  ARTIFICIAL TEAR OINTMENT OP Place 1 application into both eyes daily as needed (For dry eyes.).    Marland Kitchen aspirin EC 81 MG tablet Take 81 mg by mouth daily.    . Cyanocobalamin (VITAMIN B 12 PO) Take by mouth.    . dorzolamide  (TRUSOPT) 2 % ophthalmic solution Place 1 drop into both eyes 2 (two) times daily.     . ferrous sulfate 325 (65 FE) MG tablet Take 325 mg by mouth daily with breakfast.    . finasteride (PROSCAR) 5 MG tablet Take 5 mg by mouth daily.  3  . lisinopril (PRINIVIL,ZESTRIL) 20 MG tablet Take 20 mg by mouth every morning.     . Multiple Vitamins-Minerals (MULTIVITAMINS THER. W/MINERALS) TABS tablet Take 1 tablet by mouth daily.    . Omega-3 Fatty Acids (FISH OIL PO) Take 4 capsules by mouth daily.    Marland Kitchen omeprazole (PRILOSEC) 20 MG capsule Take 20 mg by mouth daily.    . simvastatin (ZOCOR) 20 MG tablet Take 20 mg by mouth at bedtime.     . tamsulosin (FLOMAX) 0.4 MG CAPS capsule Take 1 capsule (0.4 mg total) by mouth daily. 30 capsule 1  . travoprost, benzalkonium, (TRAVATAN) 0.004 % ophthalmic solution Place 1 drop into both eyes at bedtime.      No current facility-administered medications for this visit.     PHYSICAL EXAMINATION: ECOG PERFORMANCE STATUS: 0 - Asymptomatic  Vitals:   10/17/16 0900  BP: 137/65  Pulse: 60  Resp: 18  Temp: 98.3 F (36.8 C)  SpO2: 100%   Filed Weights   10/17/16 0900  Weight: 221 lb 14.4 oz (100.7 kg)    GENERAL:alert, no distress and comfortable SKIN: skin color, texture, turgor are normal, no rashes or significant lesions EYES: normal, Conjunctiva are pink and non-injected, sclera clear OROPHARYNX:no exudate, no erythema and lips, buccal mucosa, and tongue normal  NECK: supple, thyroid normal size, non-tender, without nodularity LYMPH:  no palpable lymphadenopathy in the cervical, axillary or inguinal LUNGS: clear to auscultation and percussion with normal breathing effort HEART: regular rate & rhythm and no murmurs and no lower extremity edema ABDOMEN:abdomen soft, non-tender and normal bowel sounds Musculoskeletal:no cyanosis of digits and no clubbing  NEURO: alert & oriented x 3 with fluent speech, no focal motor/sensory deficits  LABORATORY  DATA:  I have reviewed the data as listed    Component Value Date/Time   NA 140 10/16/2016 0816   K 4.1 10/16/2016 0816   CL 105 10/18/2015 0442   CO2 24 10/16/2016 0816   GLUCOSE 96 10/16/2016 0816   BUN 17.4 10/16/2016 0816   CREATININE 1.8 (H) 10/16/2016 0816   CALCIUM 9.6 10/16/2016 0816   PROT 7.2 10/16/2016 0816   ALBUMIN 3.5 10/16/2016 0816   AST 20 10/16/2016 0816   ALT 16 10/16/2016 0816   ALKPHOS 69 10/16/2016 0816   BILITOT 0.44 10/16/2016 0816   GFRNONAA 45 (L) 10/18/2015 0442   GFRAA 52 (L) 10/18/2015 0442    No results found for: SPEP, UPEP  Lab Results  Component Value Date   WBC 4.8 10/16/2016   NEUTROABS 2.9 10/16/2016   HGB 11.7 (L) 10/16/2016   HCT 34.5 (L) 10/16/2016   MCV 97.2 10/16/2016   PLT 227 10/16/2016      Chemistry      Component Value Date/Time   NA 140 10/16/2016 0816   K 4.1 10/16/2016 0816   CL 105 10/18/2015 0442   CO2 24 10/16/2016 0816  BUN 17.4 10/16/2016 0816   CREATININE 1.8 (H) 10/16/2016 0816      Component Value Date/Time   CALCIUM 9.6 10/16/2016 0816   ALKPHOS 69 10/16/2016 0816   AST 20 10/16/2016 0816   ALT 16 10/16/2016 0816   BILITOT 0.44 10/16/2016 0816       RADIOGRAPHIC STUDIES: I have personally reviewed the radiological images as listed and agreed with the findings in the report. Ct Abdomen Pelvis W Wo Contrast  Result Date: 10/16/2016 CLINICAL DATA:  History of gastrointestinal stromal tumor of the small intestine and left renal cell carcinoma. Status post partial left nephrectomy and small bowel resection. EXAM: CT CHEST WITH CONTRAST CT ABDOMEN AND PELVIS WITH AND WITHOUT CONTRAST TECHNIQUE: Multidetector CT imaging of the chest was performed during intravenous contrast administration. Multidetector CT imaging of the abdomen and pelvis was performed following the standard protocol before and during bolus administration of intravenous contrast. CONTRAST:  15mL ISOVUE-300 IOPAMIDOL (ISOVUE-300) INJECTION  61% COMPARISON:  MRI abdomen 06/29/2015 and CT abdomen 06/20/2015 FINDINGS: CT CHEST FINDINGS Cardiovascular: The heart is normal in size. Small pericardial effusion and fluid in the pericardial recesses. There is mild tortuosity and calcification of the thoracic aorta. Three-vessel coronary artery calcifications are also noted. Mediastinum/Nodes: Small scattered mediastinal and hilar lymph nodes. 7 mm precarinal lymph node on image number 39. 7 mm subcarinal lymph node on image number 47. The esophagus is grossly normal. Lungs/Pleura: No acute pulmonary findings. No worrisome pulmonary nodules to suggest metastatic disease. A few small scattered calcified granulomas are noted along with small nodules along the fissures which are likely lymph nodes. No pleural effusion or pleural lesions. Chest wall/ Musculoskeletal: No chest wall mass, supraclavicular or axillary lymphadenopathy. Small scattered lymph nodes are noted. The thyroid gland is grossly normal. No significant bony findings. CT ABDOMEN AND PELVIS FINDINGS Hepatobiliary: Benign hepatic cysts are again noted. No worrisome hepatic lesions or intrahepatic biliary dilatation. The gallbladder is normal. No common bile duct dilatation. Pancreas: No mass, inflammation or ductal dilatation. Spleen: Normal size.  No focal lesions. Adrenals/Urinary Tract: The adrenal glands are unremarkable. Numerous bilateral renal cysts, some of which are hyperdense/hemorrhagic. Surgical changes involving the left kidney from a prior partial nephrectomy. No findings for residual or recurrent tumor. No worrisome renal lesions. No renal or ureteral calculi. No bladder calculi or mass. Stomach/Bowel: The stomach is unremarkable. The duodenum appears normal. The small lesion was surgically resected. No findings for residual or recurrent tumor. The small bowel is unremarkable. The terminal ileum is normal. The appendix is normal. Scattered colonic diverticulosis but no mass lesions or  obstructive findings. Vascular/Lymphatic: Stable atherosclerotic calcifications involving the aorta iliac arteries. The branch vessels are patent. The major venous structures are patent. Small scattered mesenteric and retroperitoneal lymph nodes but no mass or lymphadenopathy. Reproductive: Enlarged prostate gland with impression on the base the bladder. The seminal vesicles appear normal. Other: No pelvic mass or pelvic lymphadenopathy. Numerous inguinal lymph nodes are noted but these have not benign-appearing fatty hila. Periumbilical abdominal wall hernia containing fat. Musculoskeletal: Small benign-appearing lipoma is noted in the right iliopsoas muscle. The bony structures are unremarkable. IMPRESSION: 1. Status post resection of a duodenum GIST. No findings for residual or recurrent tumor or metastatic disease. 2. Status post partial left nephrectomy for small renal cell carcinoma. No findings for recurrent tumor or metastatic disease. 3. Numerous bilateral renal cysts, some of which are hyperdense/hemorrhagic. No worrisome renal lesions. 4. Markedly enlarged prostate gland. 5. Small pericardial effusion and  scattered mediastinal lymph nodes but no mass or overt adenopathy. 6. Benign-appearing hepatic cysts. Electronically Signed   By: Marijo Sanes M.D.   On: 10/16/2016 10:12   Ct Chest W Contrast  Result Date: 10/16/2016 CLINICAL DATA:  History of gastrointestinal stromal tumor of the small intestine and left renal cell carcinoma. Status post partial left nephrectomy and small bowel resection. EXAM: CT CHEST WITH CONTRAST CT ABDOMEN AND PELVIS WITH AND WITHOUT CONTRAST TECHNIQUE: Multidetector CT imaging of the chest was performed during intravenous contrast administration. Multidetector CT imaging of the abdomen and pelvis was performed following the standard protocol before and during bolus administration of intravenous contrast. CONTRAST:  21mL ISOVUE-300 IOPAMIDOL (ISOVUE-300) INJECTION 61%  COMPARISON:  MRI abdomen 06/29/2015 and CT abdomen 06/20/2015 FINDINGS: CT CHEST FINDINGS Cardiovascular: The heart is normal in size. Small pericardial effusion and fluid in the pericardial recesses. There is mild tortuosity and calcification of the thoracic aorta. Three-vessel coronary artery calcifications are also noted. Mediastinum/Nodes: Small scattered mediastinal and hilar lymph nodes. 7 mm precarinal lymph node on image number 39. 7 mm subcarinal lymph node on image number 47. The esophagus is grossly normal. Lungs/Pleura: No acute pulmonary findings. No worrisome pulmonary nodules to suggest metastatic disease. A few small scattered calcified granulomas are noted along with small nodules along the fissures which are likely lymph nodes. No pleural effusion or pleural lesions. Chest wall/ Musculoskeletal: No chest wall mass, supraclavicular or axillary lymphadenopathy. Small scattered lymph nodes are noted. The thyroid gland is grossly normal. No significant bony findings. CT ABDOMEN AND PELVIS FINDINGS Hepatobiliary: Benign hepatic cysts are again noted. No worrisome hepatic lesions or intrahepatic biliary dilatation. The gallbladder is normal. No common bile duct dilatation. Pancreas: No mass, inflammation or ductal dilatation. Spleen: Normal size.  No focal lesions. Adrenals/Urinary Tract: The adrenal glands are unremarkable. Numerous bilateral renal cysts, some of which are hyperdense/hemorrhagic. Surgical changes involving the left kidney from a prior partial nephrectomy. No findings for residual or recurrent tumor. No worrisome renal lesions. No renal or ureteral calculi. No bladder calculi or mass. Stomach/Bowel: The stomach is unremarkable. The duodenum appears normal. The small lesion was surgically resected. No findings for residual or recurrent tumor. The small bowel is unremarkable. The terminal ileum is normal. The appendix is normal. Scattered colonic diverticulosis but no mass lesions or  obstructive findings. Vascular/Lymphatic: Stable atherosclerotic calcifications involving the aorta iliac arteries. The branch vessels are patent. The major venous structures are patent. Small scattered mesenteric and retroperitoneal lymph nodes but no mass or lymphadenopathy. Reproductive: Enlarged prostate gland with impression on the base the bladder. The seminal vesicles appear normal. Other: No pelvic mass or pelvic lymphadenopathy. Numerous inguinal lymph nodes are noted but these have not benign-appearing fatty hila. Periumbilical abdominal wall hernia containing fat. Musculoskeletal: Small benign-appearing lipoma is noted in the right iliopsoas muscle. The bony structures are unremarkable. IMPRESSION: 1. Status post resection of a duodenum GIST. No findings for residual or recurrent tumor or metastatic disease. 2. Status post partial left nephrectomy for small renal cell carcinoma. No findings for recurrent tumor or metastatic disease. 3. Numerous bilateral renal cysts, some of which are hyperdense/hemorrhagic. No worrisome renal lesions. 4. Markedly enlarged prostate gland. 5. Small pericardial effusion and scattered mediastinal lymph nodes but no mass or overt adenopathy. 6. Benign-appearing hepatic cysts. Electronically Signed   By: Marijo Sanes M.D.   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 Overall, there is no data to support adjuvant Gleevec due to low mitotic index and small size of tumor, despite positive margin. I recommend close observation with imaging study again in 6 months. 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 He does not require  adjuvant treatment. He will continue close follow-up with urologist As above, I plan to repeat imaging study in 6 months  Deficiency anemia He has multifactorial anemia, combination of anemia chronic illness from chronic renal failure and history of iron deficiency anemia With normal MCV, I recommend discontinuation of iron supplement I plan to recheck iron studies again in 6 months From the anemia chronic renal failure standpoint, he does not require ESA   Orders Placed This Encounter  Procedures  . CT Abdomen Wo Contrast    Standing Status:   Future    Standing Expiration Date:   11/21/2017    Order Specific Question:   Preferred imaging location?    Answer:   [facility]    Order Specific Question:   Radiology Contrast Protocol - do NOT remove file path    Answer:   [email or web address]  . CBC with Differential/Platelet    Standing Status:   Future    Standing Expiration Date:   11/21/2017  . Comprehensive metabolic panel    Standing Status:   Future    Standing Expiration Date:   11/21/2017  . Ferritin    Standing Status:   Future    Standing Expiration Date:   11/21/2017  . Iron and TIBC    Standing Status:   Future    Standing Expiration Date:   11/21/2017   All questions were answered. The patient knows to call the clinic with any problems, questions or concerns. No barriers to learning was detected. I spent 15 minutes counseling the patient face to face. The total time spent in the appointment was 20 minutes and more than 50% was on counseling and review of test results     [name], MD 10/17/2016 9:50 AM

## 2016-10-17 NOTE — Assessment & Plan Note (Signed)
Surgical resection was complete. He does not require adjuvant treatment. He will continue close follow-up with urologist As above, I plan to repeat imaging study in 6 months

## 2016-10-17 NOTE — Assessment & Plan Note (Signed)
His case was reviewed in the GI tumor board. Overall, there is no data to support adjuvant Gleevec due to low mitotic index and small size of tumor, despite positive margin. I recommend close observation with imaging study again in 6 months. I will see him back in 6 months for blood work, history and physical examination along with imaging study

## 2016-10-17 NOTE — Telephone Encounter (Signed)
appts made per 8/17 los and contrast was given as well  Mike Roach

## 2016-10-17 NOTE — Assessment & Plan Note (Signed)
He has multifactorial anemia, combination of anemia chronic illness from chronic renal failure and history of iron deficiency anemia With normal MCV, I recommend discontinuation of iron supplement I plan to recheck iron studies again in 6 months From the anemia chronic renal failure standpoint, he does not require ESA

## 2016-10-21 DIAGNOSIS — H25011 Cortical age-related cataract, right eye: Secondary | ICD-10-CM | POA: Diagnosis not present

## 2016-10-21 DIAGNOSIS — H25041 Posterior subcapsular polar age-related cataract, right eye: Secondary | ICD-10-CM | POA: Diagnosis not present

## 2016-10-21 DIAGNOSIS — H2511 Age-related nuclear cataract, right eye: Secondary | ICD-10-CM | POA: Diagnosis not present

## 2017-01-07 DIAGNOSIS — H04123 Dry eye syndrome of bilateral lacrimal glands: Secondary | ICD-10-CM | POA: Diagnosis not present

## 2017-01-07 DIAGNOSIS — H401132 Primary open-angle glaucoma, bilateral, moderate stage: Secondary | ICD-10-CM | POA: Diagnosis not present

## 2017-01-07 DIAGNOSIS — H40051 Ocular hypertension, right eye: Secondary | ICD-10-CM | POA: Diagnosis not present

## 2017-01-07 DIAGNOSIS — H16223 Keratoconjunctivitis sicca, not specified as Sjogren's, bilateral: Secondary | ICD-10-CM | POA: Diagnosis not present

## 2017-01-12 DIAGNOSIS — C642 Malignant neoplasm of left kidney, except renal pelvis: Secondary | ICD-10-CM | POA: Diagnosis not present

## 2017-01-12 DIAGNOSIS — N400 Enlarged prostate without lower urinary tract symptoms: Secondary | ICD-10-CM | POA: Diagnosis not present

## 2017-01-12 DIAGNOSIS — M7521 Bicipital tendinitis, right shoulder: Secondary | ICD-10-CM | POA: Diagnosis not present

## 2017-01-27 ENCOUNTER — Other Ambulatory Visit: Payer: Self-pay | Admitting: Urology

## 2017-01-27 DIAGNOSIS — C642 Malignant neoplasm of left kidney, except renal pelvis: Secondary | ICD-10-CM

## 2017-02-05 ENCOUNTER — Ambulatory Visit (HOSPITAL_COMMUNITY)
Admission: RE | Admit: 2017-02-05 | Discharge: 2017-02-05 | Disposition: A | Discharge status: Home / Self Care | Payer: Medicare HMO | Source: Ambulatory Visit | Attending: Urology | Admitting: Urology

## 2017-02-05 DIAGNOSIS — I700 Atherosclerosis of aorta: Secondary | ICD-10-CM | POA: Insufficient documentation

## 2017-02-05 DIAGNOSIS — I2510 Atherosclerotic heart disease of native coronary artery without angina pectoris: Secondary | ICD-10-CM | POA: Insufficient documentation

## 2017-02-05 DIAGNOSIS — I313 Pericardial effusion (noninflammatory): Secondary | ICD-10-CM | POA: Insufficient documentation

## 2017-02-05 DIAGNOSIS — N400 Enlarged prostate without lower urinary tract symptoms: Secondary | ICD-10-CM | POA: Diagnosis not present

## 2017-02-05 DIAGNOSIS — I517 Cardiomegaly: Secondary | ICD-10-CM | POA: Diagnosis not present

## 2017-02-05 DIAGNOSIS — K7689 Other specified diseases of liver: Secondary | ICD-10-CM | POA: Diagnosis not present

## 2017-02-05 DIAGNOSIS — C642 Malignant neoplasm of left kidney, except renal pelvis: Secondary | ICD-10-CM | POA: Diagnosis present

## 2017-02-05 DIAGNOSIS — N281 Cyst of kidney, acquired: Secondary | ICD-10-CM | POA: Diagnosis not present

## 2017-02-05 DIAGNOSIS — C649 Malignant neoplasm of unspecified kidney, except renal pelvis: Secondary | ICD-10-CM | POA: Diagnosis not present

## 2017-02-05 DIAGNOSIS — K5730 Diverticulosis of large intestine without perforation or abscess without bleeding: Secondary | ICD-10-CM | POA: Diagnosis not present

## 2017-02-05 LAB — POCT I-STAT CREATININE: Creatinine, Ser: 1.8 mg/dL — ABNORMAL HIGH (ref 0.61–1.24)

## 2017-02-05 MED ORDER — IOPAMIDOL (ISOVUE-300) INJECTION 61%
100.0000 mL | Freq: Once | INTRAVENOUS | Status: AC | PRN
  Administered 2017-02-05: 100 mL via INTRAVENOUS

## 2017-02-10 ENCOUNTER — Telehealth: Payer: Self-pay | Admitting: Radiology

## 2017-02-10 ENCOUNTER — Ambulatory Visit: Payer: Medicare HMO | Admitting: Urology

## 2017-02-10 NOTE — Telephone Encounter (Signed)
Patient left message for call back to make appt, I have called to schedule.

## 2017-02-16 ENCOUNTER — Encounter: Payer: Self-pay | Admitting: Orthopedic Surgery

## 2017-02-16 ENCOUNTER — Ambulatory Visit: Payer: Medicare HMO | Admitting: Orthopedic Surgery

## 2017-02-16 ENCOUNTER — Ambulatory Visit (INDEPENDENT_AMBULATORY_CARE_PROVIDER_SITE_OTHER): Payer: Medicare HMO

## 2017-02-16 VITALS — BP 118/64 | HR 74 | Ht 74.0 in | Wt 220.0 lb

## 2017-02-16 DIAGNOSIS — S46212A Strain of muscle, fascia and tendon of other parts of biceps, left arm, initial encounter: Secondary | ICD-10-CM

## 2017-02-16 DIAGNOSIS — M25511 Pain in right shoulder: Secondary | ICD-10-CM

## 2017-02-16 NOTE — Progress Notes (Signed)
Progress Note   Patient ID: Mike Roach, male   DOB: [birth year], 76 y.o.   MRN: [id number]  Chief Complaint  Patient presents with  . Shoulder Pain    right biceps area, felt pain with lifting 100lb bags on 01/11/17    76 year old male presents for evaluation of his right shoulder and arm status post painful pop felt after lifting 100 pound bags of dog food.  Location right biceps and shoulder Quality dull Severity mild relieved by Toradol 10 mg Duration 36 days Timing is constant Context improving     Review of Systems  Constitutional: Negative for chills and fever.  Musculoskeletal: Negative for neck pain.  Neurological: Negative for tingling.   Current Meds  Medication Sig  . ARTIFICIAL TEAR OINTMENT OP Place 1 application into both eyes daily as needed (For dry eyes.).  Marland Kitchen aspirin EC 81 MG tablet Take 81 mg by mouth daily.  . Cyanocobalamin (VITAMIN B 12 PO) Take by mouth.  . dorzolamide (TRUSOPT) 2 % ophthalmic solution Place 1 drop into both eyes 2 (two) times daily.   . ferrous sulfate 325 (65 FE) MG tablet Take 325 mg by mouth daily with breakfast.  . finasteride (PROSCAR) 5 MG tablet Take 5 mg by mouth daily.  Marland Kitchen lisinopril (PRINIVIL,ZESTRIL) 20 MG tablet Take 20 mg by mouth every morning.   . Multiple Vitamins-Minerals (MULTIVITAMINS THER. W/MINERALS) TABS tablet Take 1 tablet by mouth daily.  . Omega-3 Fatty Acids (FISH OIL PO) Take 4 capsules by mouth daily.  Marland Kitchen omeprazole (PRILOSEC) 20 MG capsule Take 20 mg by mouth daily.  . simvastatin (ZOCOR) 20 MG tablet Take 20 mg by mouth at bedtime.   . travoprost, benzalkonium, (TRAVATAN) 0.004 % ophthalmic solution Place 1 drop into both eyes at bedtime.     Past Medical History:  Diagnosis Date  . Adenomatous colon polyp 09/21/2014  . Anemia in chronic illness 03/07/2015   being evaluated  . Arthritis   . Bilateral hearing loss 03/15/2015  . Chronic kidney disease   . Chronic kidney disease (CKD) 03/07/2015  . GERD  (gastroesophageal reflux disease)   . Glaucoma   . Hypercholesterolemia   . Hypertension   . Iron deficiency anemia 03/15/2015  . Prostate enlargement   . Skin lesion of left leg 03/07/2015     No Known Allergies  BP 118/64   Pulse 74   Ht 6\' 2"  (1.88 m)   Wt 220 lb (99.8 kg)   BMI 28.25 kg/m    Physical Exam  Constitutional: He is oriented to person, place, and time. He appears well-developed and well-nourished.  Vital signs have been reviewed and are stable. Gen. appearance the patient is well-developed and well-nourished with normal grooming and hygiene.   Musculoskeletal:  GAIT IS NORMAL  Neurological: He is alert and oriented to person, place, and time.  Skin: Skin is warm and dry. No erythema.  Psychiatric: He has a normal mood and affect.  Vitals reviewed.   Right Shoulder Exam   Tenderness  Right shoulder tenderness location: Biceps muscle.  Range of Motion  The patient has normal right shoulder ROM.  Muscle Strength  Abduction: 5/5  Internal rotation: 5/5  External rotation: 5/5  Supraspinatus: 5/5  Subscapularis: 5/5  Biceps: 4/5   Tests  Apprehension: negative Hawkins test: negative Cross arm: negative Impingement: negative Drop arm: negative Sulcus: absent  Other  Erythema: absent Scars: absent Sensation: normal Pulse: present  Comments:  Table in abduction external rotation  Biceps tendon  test distally was normal   Left Shoulder Exam   Tenderness  The patient is experiencing no tenderness.   Range of Motion  The patient has normal left shoulder ROM.  Muscle Strength  The patient has normal left shoulder strength.  Tests  Apprehension: negative  Other  Erythema: absent Scars: absent Sensation: normal Pulse: present   Comments:  Stable in abduction external rotation       Medical decision-making  Imaging:   Internal x-ray right shoulder Right shoulder pain AP and lateral Please see dictated report glenohumeral  arthritis chronic rotator cuff tendinosis changes no acute fracture    Encounter Diagnosis  Name Primary?  . Pain in joint of right shoulder Yes   Symptomatic treatment return to normal activities gradually avoid heavy lifting for additional 2 weeks   Arther Abbott, MD 02/16/2017 9:21 AM

## 2017-02-16 NOTE — Patient Instructions (Addendum)
Biceps Tendon Disruption (Proximal) The proximal biceps tendon is a strong cord of tissue that connects the biceps muscle, on the front of the upper arm, to the shoulder blade. A proximal biceps tendon disruption can include a partial or complete tear of the tendon near where it connects to the bone near the shoulder. A proximal biceps tendon disruption can interfere with the ability to lift the arm in front of the body, stabilize the shoulder, bend the elbow, and turn the hand palm-up (supination). What are the causes? A biceps tendon disruption happens when the tendon is exposed to too much force. This excess force may be caused by:  The elbow being suddenly straightened from a bent position because of an external force. This could happen, for example, while catching a heavy weight or being pulled when waterskiing.  Wear and tear from physical activity.  Breaking a fall with your hand.  What increases the risk? The following factors may make you more likely to develop this condition:  Playing contact sports.  Doing activities or sports that involve throwing or overhead movements, such as racket sports, gymnastics, or baseball.  Doing activities or sports that involve putting sudden force on the arm, such as weightlifting or waterskiing.  Having a weakened tendon. The tendon may be weak because of: ? Long-lasting (chronic) biceps tendinitis. ? Certain medical conditions, such as diabetes or rheumatoid arthritis. ? Repeated corticosteroid use. ? Repetitive overhead movements.  What are the signs or symptoms? Symptoms of this condition may include:  Sudden sharp pain in the front of the shoulder. Pain may get worse during certain movements, such as: ? Lifting or carrying objects. ? Straightening the elbow. ? Throwing or using overhead movements.  Inflammation or a feeling of unusual warmth on the front of the shoulder.  Painful tightening (spasm) of the biceps muscle.  A bulge on  the inside of the upper arm when the elbow is bent.  Bruising in the shoulder or upper arm. This may develop 24-48 hours after the tendon is injured.  Limited range of motion of the shoulder and elbow.  Weakness in the elbow and forearm when: ? Bending the elbow. ? Rotating the wrist.  How is this diagnosed? This condition is diagnosed based on your symptoms, your medical history, and a physical exam. Your health care provider may test the strength and range of motion of your shoulder and elbow. You may have imaging tests, such as X-rays, MRI, or ultrasound. How is this treated? This condition is treated by resting and icing the injured area, and by doing physical therapy exercises. Depending on the severity of your condition, treatment may also include:  Medicines to help relieve pain and inflammation.  Avoiding certain activities that put stress on your shoulder.  One or more injections of medicines (corticosteroids) into your upper arm to help reduce inflammation (rare).  Surgery to repair the tear. This may be needed if nonsurgical treatments do not improve your condition.  Follow these instructions at home: Managing pain, stiffness, and swelling  If directed, put ice on the injured area: ? Put ice in a plastic bag. ? Place a towel between your skin and the bag. ? Leave the ice on for 20 minutes, 2-3 times a day.  Move your fingers often to avoid stiffness and to lessen swelling.  Raise (elevate) the injured area while you are sitting or lying down. Activity  Return to your normal activities as told by your health care provider. Ask your health   care provider what activities are safe for you.  Avoid activities that cause pain or make your condition worse.  Do not lift anything that is heavier than 10 lb (4.5 kg) until your health care provider approves.  Do exercises as told by your health care provider. General instructions  Take over-the-counter and prescription  medicines only as told by your health care provider.  Do not drive or operate heavy machinery while taking prescription pain medicines.  Keep all follow-up visits as told by your health care provider. This is important. How is this prevented?  Warm up and stretch before being active.  Cool down and stretch after being active.  Give your body time to rest between periods of activity.  Make sure to use equipment that fits you.  Be safe and responsible while being active to avoid falls.  Maintain physical fitness, including strength and flexibility. Contact a health care provider if:  You have symptoms that get worse or do not get better after 2 weeks of treatment.  You develop new symptoms. Get help right away if:  You have severe pain.  You develop pain or numbness in your hand.  Your hand feels unusually cold.  Your fingernails turn a dark color, such as blue or gray. This information is not intended to replace advice given to you by your health care provider. Make sure you discuss any questions you have with your health care provider. Document Released: 02/17/2005 Document Revised: 10/25/2015 Document Reviewed: 01/26/2015 Elsevier Interactive Patient Education  2018 [employer]  

## 2017-03-12 DIAGNOSIS — E785 Hyperlipidemia, unspecified: Secondary | ICD-10-CM | POA: Insufficient documentation

## 2017-03-12 DIAGNOSIS — C49A4 Gastrointestinal stromal tumor of large intestine: Secondary | ICD-10-CM | POA: Insufficient documentation

## 2017-03-31 ENCOUNTER — Ambulatory Visit: Payer: Medicare HMO | Admitting: Urology

## 2017-03-31 DIAGNOSIS — R972 Elevated prostate specific antigen [PSA]: Secondary | ICD-10-CM

## 2017-03-31 DIAGNOSIS — C642 Malignant neoplasm of left kidney, except renal pelvis: Secondary | ICD-10-CM | POA: Diagnosis not present

## 2017-03-31 DIAGNOSIS — N5201 Erectile dysfunction due to arterial insufficiency: Secondary | ICD-10-CM

## 2017-04-07 ENCOUNTER — Telehealth: Payer: Self-pay

## 2017-04-07 NOTE — Telephone Encounter (Signed)
LVM for pt to return my call regarding CT scan

## 2017-04-08 ENCOUNTER — Telehealth: Payer: Self-pay | Admitting: Hematology and Oncology

## 2017-04-08 ENCOUNTER — Telehealth: Payer: Self-pay

## 2017-04-08 NOTE — Telephone Encounter (Signed)
Spoke with Mike Roach by phone.  Informed him CT scan shows no cancer per Dr Alvy Bimler.  Explained to pt that he would need to f/u with his GI doctor for an EGD if he's not had one in the past year.  Pt was not sure who his GI doctor is.  From his chart is appears he has seen Dr Carlean Purl with [facility] in the past.  Pt was given [facility] telephone number and states he will call them to set up appt for an EGD.  Pt also informed that per Dr Alvy Bimler, his appt for this month will be cancelled and rescheduled for June. Pt verbalizes understanding of all instructions and advised to call back with any questions or concerns.

## 2017-04-08 NOTE — Telephone Encounter (Signed)
Mailed patient calendar of upcoming June appointments per 2/6 sch message.

## 2017-04-20 ENCOUNTER — Ambulatory Visit (HOSPITAL_COMMUNITY): Payer: Medicare HMO

## 2017-04-20 ENCOUNTER — Other Ambulatory Visit: Payer: Medicare HMO

## 2017-04-21 ENCOUNTER — Ambulatory Visit: Payer: Medicare HMO | Admitting: Hematology and Oncology

## 2017-04-28 ENCOUNTER — Ambulatory Visit: Payer: Medicare HMO | Admitting: Urology

## 2017-04-28 DIAGNOSIS — N5201 Erectile dysfunction due to arterial insufficiency: Secondary | ICD-10-CM

## 2017-06-01 ENCOUNTER — Ambulatory Visit: Payer: Medicare HMO | Admitting: Orthopedic Surgery

## 2017-06-01 VITALS — BP 110/66 | HR 80 | Ht 74.0 in | Wt 220.0 lb

## 2017-06-01 DIAGNOSIS — S46212A Strain of muscle, fascia and tendon of other parts of biceps, left arm, initial encounter: Secondary | ICD-10-CM | POA: Insufficient documentation

## 2017-06-01 DIAGNOSIS — S46211D Strain of muscle, fascia and tendon of other parts of biceps, right arm, subsequent encounter: Secondary | ICD-10-CM

## 2017-06-01 NOTE — Progress Notes (Signed)
Progress Note   Patient ID: Mike Roach, male   DOB: 26-May-1940, 78 y.o.   MRN: [id number]  Chief Complaint  Patient presents with  . Follow-up    Recheck on right arm, bicep tendon.    This is a 77 year old male who injured his proximal right biceps tendon back in December.  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   No outpatient medications have been marked as taking for the 06/01/17 encounter (Office Visit) with Carole Civil, MD.    No Known Allergies   BP 110/66   Pulse 80   Ht 6\' 2"  (1.88 m)   Wt 220 lb (99.8 kg)   BMI 28.25 kg/m   Physical Exam  Constitutional: He is oriented to person, place, and time. He appears well-developed and well-nourished.  Vital signs have been reviewed and are stable. Gen. appearance the patient is well-developed and well-nourished with normal grooming and hygiene.   Musculoskeletal:       Arms: Neurological: He is alert and oriented to person, place, and time.  Skin: Skin is warm and dry. No erythema.  Psychiatric: He has a normal mood and affect.  Vitals reviewed.    Medical decision-making Encounter Diagnosis  Name Primary?  . Rupture of right proximal biceps tendon, subsequent encounter Yes    Recommend symptomatic treatment follow-up as needed  No orders of the defined types were placed in this encounter.    Arther Abbott, MD 06/01/2017 9:50 AM

## 2017-06-01 NOTE — Patient Instructions (Signed)
These are the muscle and arthrits creams I recommend:  PLEASE READ THE PACKAGE INSTRUCTIONS BEFORE USING   Ben Gay arthritis cream  Icy hot vanishing gel  Aspercreme odor free  Myoflex Oderless pain reliever  Capzasin  Sportscreme  Max freeze  

## 2017-06-04 ENCOUNTER — Encounter: Payer: Self-pay | Admitting: Internal Medicine

## 2017-06-04 ENCOUNTER — Other Ambulatory Visit (INDEPENDENT_AMBULATORY_CARE_PROVIDER_SITE_OTHER): Payer: Medicare HMO

## 2017-06-04 ENCOUNTER — Ambulatory Visit: Payer: Medicare HMO | Admitting: Internal Medicine

## 2017-06-04 VITALS — BP 134/72 | HR 66 | Ht 74.5 in | Wt 221.0 lb

## 2017-06-04 DIAGNOSIS — K31819 Angiodysplasia of stomach and duodenum without bleeding: Secondary | ICD-10-CM

## 2017-06-04 DIAGNOSIS — C49A3 Gastrointestinal stromal tumor of small intestine: Secondary | ICD-10-CM

## 2017-06-04 DIAGNOSIS — D500 Iron deficiency anemia secondary to blood loss (chronic): Secondary | ICD-10-CM

## 2017-06-04 LAB — CBC WITH DIFFERENTIAL/PLATELET
BASOS ABS: 0.1 10*3/uL (ref 0.0–0.1)
Basophils Relative: 1.8 % (ref 0.0–3.0)
EOS ABS: 0.2 10*3/uL (ref 0.0–0.7)
Eosinophils Relative: 3.1 % (ref 0.0–5.0)
HEMATOCRIT: 36.6 % — AB (ref 39.0–52.0)
HEMOGLOBIN: 12.4 g/dL — AB (ref 13.0–17.0)
LYMPHS PCT: 26.6 % (ref 12.0–46.0)
Lymphs Abs: 1.6 10*3/uL (ref 0.7–4.0)
MCHC: 34 g/dL (ref 30.0–36.0)
MCV: 97.4 fl (ref 78.0–100.0)
MONOS PCT: 12.2 % — AB (ref 3.0–12.0)
Monocytes Absolute: 0.7 10*3/uL (ref 0.1–1.0)
Neutro Abs: 3.3 10*3/uL (ref 1.4–7.7)
Neutrophils Relative %: 56.3 % (ref 43.0–77.0)
PLATELETS: 303 10*3/uL (ref 150.0–400.0)
RBC: 3.76 Mil/uL — AB (ref 4.22–5.81)
RDW: 14.9 % (ref 11.5–15.5)
WBC: 5.8 10*3/uL (ref 4.0–10.5)

## 2017-06-04 LAB — COMPREHENSIVE METABOLIC PANEL
ALBUMIN: 3.9 g/dL (ref 3.5–5.2)
ALT: 18 U/L (ref 0–53)
AST: 20 U/L (ref 0–37)
Alkaline Phosphatase: 73 U/L (ref 39–117)
BILIRUBIN TOTAL: 0.3 mg/dL (ref 0.2–1.2)
BUN: 21 mg/dL (ref 6–23)
CALCIUM: 9.6 mg/dL (ref 8.4–10.5)
CO2: 26 mEq/L (ref 19–32)
CREATININE: 1.44 mg/dL (ref 0.40–1.50)
Chloride: 108 mEq/L (ref 96–112)
GFR: 61.31 mL/min (ref >60.00)
Glucose, Bld: 74 mg/dL (ref 70–99)
Potassium: 4.5 mEq/L (ref 3.5–5.1)
Sodium: 139 mEq/L (ref 135–145)
Total Protein: 7.4 g/dL (ref 6.0–8.3)

## 2017-06-04 LAB — FERRITIN: Ferritin: 26.5 ng/mL (ref 22.0–322.0)

## 2017-06-04 NOTE — Patient Instructions (Signed)
You have been scheduled for an endoscopy. Please follow written instructions given to you at your visit today. If you use inhalers (even only as needed), please bring them with you on the day of your procedure.  Your provider has requested that you go to the basement level for lab work before leaving today. Press "B" on the elevator. The lab is located at the first door on the left as you exit the elevator.    I appreciate the opportunity to care for you. Carl Gessner, MD, FACG 

## 2017-06-04 NOTE — Assessment & Plan Note (Signed)
CBC and ferritin 

## 2017-06-04 NOTE — Progress Notes (Signed)
Mike Roach 77 y.o. 16-May-1940 [id number]  Assessment & Plan:   Encounter Diagnoses  Name Primary?  . 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 Iron deficiency anemia due to chronic blood loss   . Gastric and duodenal angiodysplasia      Plan for a surveillance upper GI endoscopy.  We will do this at the hospital because he had a duodenal bulb AVM in 2017 and that may need to be ablated to reduce chronic blood loss anemia issues.  We do not have that ablation capability in our endoscopy center.The risks and benefits as well as alternatives of endoscopic procedure(s) have been discussed and reviewed. All questions answered. The patient agrees to proceed.   I will check a CBC and a ferritin today also.  I appreciate the opportunity to care for this patient. CC: Pappas, Ander Gaster, NP [name], MD  Subjective:   Chief Complaint: Anemia, I need an upper endoscopy  HPI The patient is a pleasant 66 year old African-American man I met in 2017, he had an EGD for additional evaluation of an iron deficiency anemia after having had a colonoscopy prior to that by someone else, and he had ended up having a gist tumor of the second portion of the duodenum which was resected by Dr. gross.  Margins were positive.  He also had a duodenal bulb AVM.  I have not seen him since that time.  He is due to follow-up with Dr. Alvy Bimler later this year and I can see telephone correspondence indicating she recommends he have a surveillance upper GI endoscopy.  CT abdomen and pelvis in December was negative for recurrent disease, note that he is also had a left partial nephrectomy for renal cell carcinoma.  He complains of mild fatigue.  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   No Known Allergies Current Meds  Medication Sig  . ARTIFICIAL TEAR OINTMENT OP Place 1 application into  both eyes daily as needed (For dry eyes.).  Marland Kitchen aspirin EC 81 MG tablet Take 81 mg by mouth daily.  . dorzolamide (TRUSOPT) 2 % ophthalmic solution Place 1 drop into both eyes 2 (two) times daily.   . finasteride (PROSCAR) 5 MG tablet Take 5 mg by mouth daily.  Marland Kitchen lisinopril (PRINIVIL,ZESTRIL) 20 MG tablet Take 20 mg by mouth every morning.   . Multiple Vitamins-Minerals (MULTIVITAMINS THER. W/MINERALS) TABS tablet Take 1 tablet by mouth daily.  . Omega-3 Fatty Acids (FISH OIL PO) Take 4 capsules by mouth daily.  Marland Kitchen omeprazole (PRILOSEC) 20 MG capsule Take 20 mg by mouth daily.  . simvastatin (ZOCOR) 20 MG tablet Take 20 mg by mouth at bedtime.   . travoprost, benzalkonium, (TRAVATAN) 0.004 % ophthalmic solution Place 1 drop into both eyes at bedtime.    Past Medical History:  Diagnosis Date  . Adenomatous colon polyp 09/21/2014  . Anemia in chronic illness 03/07/2015   being evaluated  . Arthritis   . Bilateral hearing loss 03/15/2015  . Chronic kidney disease   . Chronic kidney disease (CKD) 03/07/2015  . GERD (gastroesophageal reflux disease)   . GIST (gastrointestinal stroma tumor), malignant, colon ([place])    Duodenom  . Glaucoma   . Hypercholesterolemia   . Hypertension   . Iron deficiency anemia 03/15/2015  . Prostate enlargement   . Skin lesion of left leg 03/07/2015   Past Surgical History:  Procedure Laterality Date  . colonoscopy    . EUS N/A 07/05/2015   Procedure: UPPER ENDOSCOPIC ULTRASOUND (EUS) LINEAR;  Surgeon: Milus Banister, MD;  Location: [facility];  Service: Endoscopy;  Laterality: N/A;  . KNEE ARTHROSCOPY WITH LATERAL MENISECTOMY Right 01/21/2013   Procedure: KNEE ARTHROSCOPY WITH PARTIAL LATERAL MENISECTOMY;  Surgeon: Carole Civil, MD;  Location: [facility];  Service: Orthopedics;  Laterality: Right;  . ORIF ANKLE DISLOCATION Right   . ROBOTIC ASSITED PARTIAL NEPHRECTOMY Left 10/17/2015   Procedure: XI ROBOTIC ASSITED LEFT PARTIAL NEPHRECTOMY, Left Cyst  Decortication, Left Renal Ultrasound;  Surgeon: [name], MD;  Location: [facility];  Service: Urology;  Laterality: Left;   Social History   Social History Narrative   Divorced   Employed as a Presenter, broadcasting   Former smoker but no current tobacco no alcohol or drug use      Review of Systems See HPI  Objective:   Physical Exam _0  134/72   Pulse 66   Ht 6' 2.5" (1.892 m)   Wt 221 lb (100.2 kg)   BMI 28.00 kg/m @  General:  NAD Eyes:   anicteric Lungs:  clear Heart::  S1S2 no rubs, murmurs or gallops Abdomen:  soft and nontender, BS+ Ext:   no edema, cyanosis or clubbing    Data Reviewed:   See HPI

## 2017-06-04 NOTE — Assessment & Plan Note (Signed)
Surveillance EGD

## 2017-06-04 NOTE — H&P (View-Only) (Signed)
Mike Roach 77 y.o. 16-May-1940 [id number]  Assessment & Plan:   Encounter Diagnoses  Name Primary?  . 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 Iron deficiency anemia due to chronic blood loss   . Gastric and duodenal angiodysplasia      Plan for a surveillance upper GI endoscopy.  We will do this at the hospital because he had a duodenal bulb AVM in 2017 and that may need to be ablated to reduce chronic blood loss anemia issues.  We do not have that ablation capability in our endoscopy center.The risks and benefits as well as alternatives of endoscopic procedure(s) have been discussed and reviewed. All questions answered. The patient agrees to proceed.   I will check a CBC and a ferritin today also.  I appreciate the opportunity to care for this patient. CC: Pappas, Ander Gaster, NP [name], MD  Subjective:   Chief Complaint: Anemia, I need an upper endoscopy  HPI The patient is a pleasant 77 year old African-American man I met in 2017, he had an EGD for additional evaluation of an iron deficiency anemia after having had a colonoscopy prior to that by someone else, and he had ended up having a gist tumor of the second portion of the duodenum which was resected by Dr. gross.  Margins were positive.  He also had a duodenal bulb AVM.  I have not seen him since that time.  He is due to follow-up with Dr. Alvy Bimler later this year and I can see telephone correspondence indicating she recommends he have a surveillance upper GI endoscopy.  CT abdomen and pelvis in December was negative for recurrent disease, note that he is also had a left partial nephrectomy for renal cell carcinoma.  He complains of mild fatigue.  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   No Known Allergies Current Meds  Medication Sig  . ARTIFICIAL TEAR OINTMENT OP Place 1 application into  both eyes daily as needed (For dry eyes.).  Marland Kitchen aspirin EC 81 MG tablet Take 81 mg by mouth daily.  . dorzolamide (TRUSOPT) 2 % ophthalmic solution Place 1 drop into both eyes 2 (two) times daily.   . finasteride (PROSCAR) 5 MG tablet Take 5 mg by mouth daily.  Marland Kitchen lisinopril (PRINIVIL,ZESTRIL) 20 MG tablet Take 20 mg by mouth every morning.   . Multiple Vitamins-Minerals (MULTIVITAMINS THER. W/MINERALS) TABS tablet Take 1 tablet by mouth daily.  . Omega-3 Fatty Acids (FISH OIL PO) Take 4 capsules by mouth daily.  Marland Kitchen omeprazole (PRILOSEC) 20 MG capsule Take 20 mg by mouth daily.  . simvastatin (ZOCOR) 20 MG tablet Take 20 mg by mouth at bedtime.   . travoprost, benzalkonium, (TRAVATAN) 0.004 % ophthalmic solution Place 1 drop into both eyes at bedtime.    Past Medical History:  Diagnosis Date  . Adenomatous colon polyp 09/21/2014  . Anemia in chronic illness 03/07/2015   being evaluated  . Arthritis   . Bilateral hearing loss 03/15/2015  . Chronic kidney disease   . Chronic kidney disease (CKD) 03/07/2015  . GERD (gastroesophageal reflux disease)   . GIST (gastrointestinal stroma tumor), malignant, colon ([place])    Duodenom  . Glaucoma   . Hypercholesterolemia   . Hypertension   . Iron deficiency anemia 03/15/2015  . Prostate enlargement   . Skin lesion of left leg 03/07/2015   Past Surgical History:  Procedure Laterality Date  . colonoscopy    . EUS N/A 07/05/2015   Procedure: UPPER ENDOSCOPIC ULTRASOUND (EUS) LINEAR;  Surgeon: Milus Banister, MD;  Location: [facility];  Service: Endoscopy;  Laterality: N/A;  . KNEE ARTHROSCOPY WITH LATERAL MENISECTOMY Right 01/21/2013   Procedure: KNEE ARTHROSCOPY WITH PARTIAL LATERAL MENISECTOMY;  Surgeon: Carole Civil, MD;  Location: [facility];  Service: Orthopedics;  Laterality: Right;  . ORIF ANKLE DISLOCATION Right   . ROBOTIC ASSITED PARTIAL NEPHRECTOMY Left 10/17/2015   Procedure: XI ROBOTIC ASSITED LEFT PARTIAL NEPHRECTOMY, Left Cyst  Decortication, Left Renal Ultrasound;  Surgeon: [name], MD;  Location: [facility];  Service: Urology;  Laterality: Left;   Social History   Social History Narrative   Divorced   Employed as a Presenter, broadcasting   Former smoker but no current tobacco no alcohol or drug use      Review of Systems See HPI  Objective:   Physical Exam _0  134/72   Pulse 66   Ht 6' 2.5" (1.892 m)   Wt 221 lb (100.2 kg)   BMI 28.00 kg/m @  General:  NAD Eyes:   anicteric Lungs:  clear Heart::  S1S2 no rubs, murmurs or gallops Abdomen:  soft and nontender, BS+ Ext:   no edema, cyanosis or clubbing    Data Reviewed:   See HPI

## 2017-06-05 ENCOUNTER — Encounter (HOSPITAL_COMMUNITY): Payer: Self-pay | Admitting: *Deleted

## 2017-06-05 ENCOUNTER — Other Ambulatory Visit: Payer: Self-pay

## 2017-06-05 NOTE — Progress Notes (Signed)
Let him know that labs actually look good.  His iron level is normal but on the low normal side so he should take ferrous sulfate 325 mg daily  I will see him and explain more next week at EGD

## 2017-06-08 ENCOUNTER — Encounter (HOSPITAL_COMMUNITY): Payer: Self-pay | Admitting: *Deleted

## 2017-06-08 ENCOUNTER — Other Ambulatory Visit: Payer: Self-pay

## 2017-06-08 ENCOUNTER — Ambulatory Visit (HOSPITAL_COMMUNITY): Payer: Medicare HMO | Admitting: Registered Nurse

## 2017-06-08 ENCOUNTER — Encounter (HOSPITAL_COMMUNITY)
Admission: RE | Disposition: A | Discharge status: Home / Self Care | Payer: Self-pay | Source: Ambulatory Visit | Attending: Internal Medicine

## 2017-06-08 ENCOUNTER — Ambulatory Visit (HOSPITAL_COMMUNITY)
Admission: RE | Admit: 2017-06-08 | Discharge: 2017-06-08 | Disposition: A | Discharge status: Home / Self Care | Payer: Medicare HMO | Source: Ambulatory Visit | Attending: Internal Medicine | Admitting: Internal Medicine

## 2017-06-08 DIAGNOSIS — N400 Enlarged prostate without lower urinary tract symptoms: Secondary | ICD-10-CM | POA: Insufficient documentation

## 2017-06-08 DIAGNOSIS — I129 Hypertensive chronic kidney disease with stage 1 through stage 4 chronic kidney disease, or unspecified chronic kidney disease: Secondary | ICD-10-CM | POA: Insufficient documentation

## 2017-06-08 DIAGNOSIS — Z7982 Long term (current) use of aspirin: Secondary | ICD-10-CM | POA: Diagnosis not present

## 2017-06-08 DIAGNOSIS — K219 Gastro-esophageal reflux disease without esophagitis: Secondary | ICD-10-CM | POA: Insufficient documentation

## 2017-06-08 DIAGNOSIS — C49A3 Gastrointestinal stromal tumor of small intestine: Secondary | ICD-10-CM

## 2017-06-08 DIAGNOSIS — D500 Iron deficiency anemia secondary to blood loss (chronic): Secondary | ICD-10-CM | POA: Diagnosis not present

## 2017-06-08 DIAGNOSIS — Z980 Intestinal bypass and anastomosis status: Secondary | ICD-10-CM | POA: Diagnosis not present

## 2017-06-08 DIAGNOSIS — E7800 Pure hypercholesterolemia, unspecified: Secondary | ICD-10-CM | POA: Diagnosis not present

## 2017-06-08 DIAGNOSIS — Z79899 Other long term (current) drug therapy: Secondary | ICD-10-CM | POA: Diagnosis not present

## 2017-06-08 DIAGNOSIS — K2980 Duodenitis without bleeding: Secondary | ICD-10-CM | POA: Diagnosis not present

## 2017-06-08 DIAGNOSIS — K449 Diaphragmatic hernia without obstruction or gangrene: Secondary | ICD-10-CM | POA: Insufficient documentation

## 2017-06-08 DIAGNOSIS — N189 Chronic kidney disease, unspecified: Secondary | ICD-10-CM | POA: Insufficient documentation

## 2017-06-08 DIAGNOSIS — D631 Anemia in chronic kidney disease: Secondary | ICD-10-CM | POA: Insufficient documentation

## 2017-06-08 DIAGNOSIS — K31819 Angiodysplasia of stomach and duodenum without bleeding: Secondary | ICD-10-CM

## 2017-06-08 DIAGNOSIS — Z8509 Personal history of malignant neoplasm of other digestive organs: Secondary | ICD-10-CM | POA: Insufficient documentation

## 2017-06-08 DIAGNOSIS — K222 Esophageal obstruction: Secondary | ICD-10-CM | POA: Diagnosis not present

## 2017-06-08 DIAGNOSIS — H9193 Unspecified hearing loss, bilateral: Secondary | ICD-10-CM | POA: Insufficient documentation

## 2017-06-08 DIAGNOSIS — D649 Anemia, unspecified: Secondary | ICD-10-CM | POA: Diagnosis present

## 2017-06-08 HISTORY — PX: ESOPHAGOGASTRODUODENOSCOPY (EGD) WITH PROPOFOL: SHX5813

## 2017-06-08 SURGERY — ESOPHAGOGASTRODUODENOSCOPY (EGD) WITH PROPOFOL
Anesthesia: Monitor Anesthesia Care

## 2017-06-08 MED ORDER — SODIUM CHLORIDE 0.9 % IV SOLN: INTRAVENOUS | Status: DC

## 2017-06-08 MED ORDER — PROPOFOL 10 MG/ML IV BOLUS
INTRAVENOUS | Status: DC | PRN
  Administered 2017-06-08 (×3): 20 mg via INTRAVENOUS

## 2017-06-08 MED ORDER — PROPOFOL 500 MG/50ML IV EMUL
INTRAVENOUS | Status: DC | PRN
Start: 2017-06-08 — End: 2017-06-08
  Administered 2017-06-08: 130 ug/kg/min via INTRAVENOUS

## 2017-06-08 MED ORDER — LACTATED RINGERS IV SOLN
INTRAVENOUS | Status: DC
  Administered 2017-06-08: 07:00:00 via INTRAVENOUS

## 2017-06-08 MED ORDER — PROPOFOL 10 MG/ML IV BOLUS
INTRAVENOUS | Status: AC
Start: 2017-06-08 — End: 2017-06-08
  Filled 2017-06-08: qty 40

## 2017-06-08 MED ORDER — FERROUS SULFATE 325 (65 FE) MG PO TABS: 325.0000 mg | ORAL_TABLET | Freq: Every day | ORAL | 3 refills | Status: DC

## 2017-06-08 SURGICAL SUPPLY — 14 items

## 2017-06-08 NOTE — Transfer of Care (Signed)
Immediate Anesthesia Transfer of Care Note  Patient: Mike Roach  Procedure(s) Performed: ESOPHAGOGASTRODUODENOSCOPY (EGD) WITH PROPOFOL (N/A )  Patient Location: PACU and Endoscopy Unit  Anesthesia Type:MAC  Level of Consciousness: awake, alert , oriented and patient cooperative  Airway & Oxygen Therapy: Patient Spontanous Breathing and Patient connected to face mask oxygen  Post-op Assessment: Report given to RN, Post -op Vital signs reviewed and stable and Patient moving all extremities  Post vital signs: Reviewed and stable  Last Vitals:  Vitals Value Taken Time  BP 91/60 06/08/2017  8:53 AM  Temp    Pulse 61 06/08/2017  8:53 AM  Resp 15 06/08/2017  8:54 AM  SpO2 100 % 06/08/2017  8:53 AM  Vitals shown include unvalidated device data.  Last Pain:  Vitals:   06/08/17 0710  TempSrc: Oral  PainSc: 0-No pain         Complications: No apparent anesthesia complications

## 2017-06-08 NOTE — Anesthesia Preprocedure Evaluation (Addendum)
Anesthesia Evaluation  Patient identified by MRN, date of birth, ID band Patient awake    Reviewed: Allergy & Precautions, H&P , NPO status , Patient's Chart, lab work & pertinent test results  Airway Mallampati: II   Neck ROM: full    Dental   Pulmonary former smoker,    breath sounds clear to auscultation       Cardiovascular hypertension,  Rhythm:regular Rate:Normal     Neuro/Psych    GI/Hepatic GERD  ,  Endo/Other    Renal/GU Renal InsufficiencyRenal disease     Musculoskeletal  (+) Arthritis ,   Abdominal   Peds  Hematology   Anesthesia Other Findings   Reproductive/Obstetrics                             Anesthesia Physical  Anesthesia Plan  ASA: III  Anesthesia Plan: MAC   Post-op Pain Management:    Induction: Intravenous  PONV Risk Score and Plan: 1 and Midazolam and Treatment may vary due to age or medical condition  Airway Management Planned: Nasal Cannula  Additional Equipment:   Intra-op Plan:   Post-operative Plan:   Informed Consent: I have reviewed the patients History and Physical, chart, labs and discussed the procedure including the risks, benefits and alternatives for the proposed anesthesia with the patient or authorized representative who has indicated his/her understanding and acceptance.     Plan Discussed with: CRNA, Anesthesiologist and Surgeon  Anesthesia Plan Comments:        Anesthesia Quick Evaluation

## 2017-06-08 NOTE — Anesthesia Postprocedure Evaluation (Signed)
Anesthesia Post Note  Patient: Mike Roach  Procedure(s) Performed: ESOPHAGOGASTRODUODENOSCOPY (EGD) WITH PROPOFOL (N/A )     Patient location during evaluation: PACU Anesthesia Type: MAC Level of consciousness: awake and alert Pain management: pain level controlled Vital Signs Assessment: post-procedure vital signs reviewed and stable Respiratory status: spontaneous breathing, nonlabored ventilation and respiratory function stable Cardiovascular status: stable and blood pressure returned to baseline Postop Assessment: no apparent nausea or vomiting Anesthetic complications: no    Last Vitals:  Vitals:   06/08/17 0910 06/08/17 0920  BP: 132/74 (!) 142/63  Pulse: (!) 56 (!) 50  Resp: 17 13  Temp:    SpO2: 99% 100%    Last Pain:  Vitals:   06/08/17 0920  TempSrc:   PainSc: 0-No pain                 Lynda Rainwater

## 2017-06-08 NOTE — Interval H&P Note (Signed)
History and Physical Interval Note:  06/08/2017 8:29 AM  Mike Roach  has presented today for surgery, with the diagnosis of GIST,ANEMIA  The various methods of treatment have been discussed with the patient and family. After consideration of risks, benefits and other options for treatment, the patient has consented to  Procedure(s): ESOPHAGOGASTRODUODENOSCOPY (EGD) WITH PROPOFOL (N/A) as a surgical intervention .  The patient's history has been reviewed, patient examined, no change in status, stable for surgery.  I have reviewed the patient's chart and labs.  Questions were answered to the patient's satisfaction.     Silvano Rusk

## 2017-06-08 NOTE — Op Note (Signed)
[facility] Patient Name: Mike Roach Procedure Date: 06/08/2017 MRN: [id number] Attending MD: [name] , MD Date of Birth: 10/13/1940 CSN: [id number] Age: 77 Admit Type: Outpatient Procedure:                Upper GI endoscopy Indications:              Surveillance procedure, Iron deficiency anemia                            secondary to chronic blood loss, hx GISt resection                            from duodenum Providers:                [name], MDStacey Duininck, RN, Laurena Spies, Technician, Courtney Heys. Arm[place], CRNA Referring MD:             [name] Medicines:                Propofol per Anesthesia, Monitored Anesthesia Care Complications:            No immediate complications. Estimated Blood Loss:     Estimated blood loss was minimal. Procedure:                Pre-Anesthesia Assessment:                           - Prior to the procedure, a History and Physical                            was performed, and patient medications and                            allergies were reviewed. The patient's tolerance of                            previous anesthesia was also reviewed. The risks                            and benefits of the procedure and the sedation                            options and risks were discussed with the patient.                            All questions were answered, and informed consent                            was obtained. Prior Anticoagulants: The patient has                            taken no previous anticoagulant or antiplatelet  agents. ASA Grade Assessment: III - A patient with                            severe systemic disease. After reviewing the risks                            and benefits, the patient was deemed in                            satisfactory condition to undergo the procedure.                           After obtaining informed consent, the  endoscope was                            passed under direct vision. Throughout the                            procedure, the patient's blood pressure, pulse, and                            oxygen saturations were monitored continuously. The                            EG-[id number] ([id number]) scope was introduced through the                            mouth, and advanced to the second part of duodenum.                            The upper GI endoscopy was accomplished without                            difficulty. The patient tolerated the procedure                            well. Scope In: Scope Out: Findings:      There was evidence of a patent previous surgical anastomosis in the       first portion of the duodenum. This was characterized by healthy       appearing mucosa. Biopsies were taken with a cold forceps for histology.       Verification of patient identification for the specimen was done.       Estimated blood loss was minimal.      A non-obstructing Schatzki ring was found at the gastroesophageal       junction.      A small hiatal hernia was present.      The cardia and gastric fundus were normal on retroflexion.      The exam was otherwise without abnormality. Impression:               - Patent previous surgical anastomosis,                            characterized by healthy appearing mucosa was found  in the duodenum. Biopsied. Was not photogtraphed                           - Non-obstructing Schatzki ring.                           - Small hiatal hernia.                           - The examination was otherwise normal. 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 The signs and symptoms of potential                            delayed complications were discussed  with the                            patient. Return to normal activities tomorrow.                            Written discharge instructions were provided to the                            patient.                           - Resume previous diet.                           - Continue present medications.                           - Await pathology results.                           - Start ferrous sulfate 325 mg qd for low NL                            ferritin Procedure Code(s):        --- Professional ---                           [phone], Esophagogastroduodenoscopy, flexible,                            transoral; with biopsy, single or multiple Diagnosis Code(s):        --- Professional ---                           Z98.0, Intestinal bypass and anastomosis status  K22.2, Esophageal obstruction                           K44.9, Diaphragmatic hernia without obstruction or                            gangrene                           D50.0, Iron deficiency anemia secondary to blood                            loss (chronic) CPT copyright 2017 [employer]. All rights reserved. The codes documented in this report are preliminary and upon coder review may  be revised to meet current compliance requirements. [name], MD 06/08/2017 9:01:27 AM This report has been signed electronically. Number of Addenda: 0

## 2017-06-08 NOTE — Discharge Instructions (Signed)
° °  Nothing worrisome seen here. I took biopsies of the area where the tumor was removed and will let you know.  Please start an iron pill (on your list as ferrous sulfate).  I appreciate the opportunity to care for you. [name], MD, FACG  YOU HAD AN ENDOSCOPIC PROCEDURE TODAY: Refer to the procedure report and other information in the discharge instructions given to you for any specific questions about what was found during the examination. If this information does not answer your questions, please call [facility] office at [phone] to clarify.   YOU SHOULD EXPECT: Some feelings of bloating in the abdomen. Passage of more gas than usual. Walking can help get rid of the air that was put into your GI tract during the procedure and reduce the bloating. If you had a lower endoscopy (such as a colonoscopy or flexible sigmoidoscopy) you may notice spotting of blood in your stool or on the toilet paper. Some abdominal soreness may be present for a day or two, also.  DIET: Your first meal following the procedure should be a light meal and then it is ok to progress to your normal diet. A half-sandwich or bowl of soup is an example of a good first meal. Heavy or fried foods are harder to digest and may make you feel nauseous or bloated. Drink plenty of fluids but you should avoid alcoholic beverages for 24 hours. If you had a esophageal dilation, please see attached instructions for diet.    ACTIVITY: Your care partner should take you home directly after the procedure. You should plan to take it easy, moving slowly for the rest of the day. You can resume normal activity the day after the procedure however YOU SHOULD NOT DRIVE, use power tools, machinery or perform tasks that involve climbing or major physical exertion for 24 hours (because of the sedation medicines used during the test).   SYMPTOMS TO REPORT IMMEDIATELY: A gastroenterologist can be reached at any hour. Please call [phone]   for any of the following symptoms:  Following lower endoscopy (colonoscopy, flexible sigmoidoscopy) Excessive amounts of blood in the stool  Significant tenderness, worsening of abdominal pains  Swelling of the abdomen that is new, acute  Fever of 100 or higher  Following upper endoscopy (EGD, EUS, ERCP, esophageal dilation) Vomiting of blood or coffee ground material  New, significant abdominal pain  New, significant chest pain or pain under the shoulder blades  Painful or persistently difficult swallowing  New shortness of breath  Black, tarry-looking or red, bloody stools  FOLLOW UP:  If any biopsies were taken you will be contacted by phone or by letter within the next 1-3 weeks. Call [phone]  if you have not heard about the biopsies in 3 weeks.  Please also call with any specific questions about appointments or follow up tests.

## 2017-06-09 ENCOUNTER — Other Ambulatory Visit: Payer: Self-pay

## 2017-06-10 ENCOUNTER — Encounter (HOSPITAL_COMMUNITY): Payer: Self-pay | Admitting: Internal Medicine

## 2017-06-12 ENCOUNTER — Encounter: Payer: Self-pay | Admitting: Internal Medicine

## 2017-06-12 NOTE — Progress Notes (Signed)
Benign biopsies Letter created

## 2017-08-04 ENCOUNTER — Inpatient Hospital Stay: Payer: Medicare HMO | Attending: Hematology and Oncology

## 2017-08-04 ENCOUNTER — Telehealth: Payer: Self-pay | Admitting: Hematology and Oncology

## 2017-08-04 ENCOUNTER — Inpatient Hospital Stay: Payer: Medicare HMO | Admitting: Hematology and Oncology

## 2017-08-04 ENCOUNTER — Encounter: Payer: Self-pay | Admitting: Hematology and Oncology

## 2017-08-04 VITALS — BP 107/52 | HR 70 | Temp 98.0°F | Resp 18 | Ht 74.0 in | Wt 220.4 lb

## 2017-08-04 DIAGNOSIS — M19041 Primary osteoarthritis, right hand: Secondary | ICD-10-CM

## 2017-08-04 DIAGNOSIS — M19042 Primary osteoarthritis, left hand: Secondary | ICD-10-CM | POA: Insufficient documentation

## 2017-08-04 DIAGNOSIS — C642 Malignant neoplasm of left kidney, except renal pelvis: Secondary | ICD-10-CM | POA: Diagnosis not present

## 2017-08-04 DIAGNOSIS — D509 Iron deficiency anemia, unspecified: Secondary | ICD-10-CM

## 2017-08-04 DIAGNOSIS — C49A3 Gastrointestinal stromal tumor of small intestine: Secondary | ICD-10-CM

## 2017-08-04 DIAGNOSIS — D539 Nutritional anemia, unspecified: Secondary | ICD-10-CM | POA: Insufficient documentation

## 2017-08-04 HISTORY — DX: Primary osteoarthritis, right hand: M19.041

## 2017-08-04 HISTORY — DX: Primary osteoarthritis, left hand: M19.042

## 2017-08-04 LAB — COMPREHENSIVE METABOLIC PANEL
ALBUMIN: 3.7 g/dL (ref 3.5–5.0)
ALK PHOS: 81 U/L (ref 40–150)
ALT: 15 U/L (ref 0–55)
AST: 19 U/L (ref 5–34)
Anion gap: 8 (ref 3–11)
BILIRUBIN TOTAL: 0.4 mg/dL (ref 0.2–1.2)
BUN: 23 mg/dL (ref 7–26)
CALCIUM: 9.1 mg/dL (ref 8.4–10.4)
CO2: 22 mmol/L (ref 22–29)
CREATININE: 1.65 mg/dL — AB (ref 0.70–1.30)
Chloride: 109 mmol/L (ref 98–109)
GFR calc Af Amer: 45 mL/min — ABNORMAL LOW (ref >60)
GFR calc non Af Amer: 39 mL/min — ABNORMAL LOW (ref >60)
GLUCOSE: 116 mg/dL (ref 70–140)
Potassium: 4 mmol/L (ref 3.5–5.1)
Sodium: 139 mmol/L (ref 136–145)
TOTAL PROTEIN: 7 g/dL (ref 6.4–8.3)

## 2017-08-04 LAB — CBC WITH DIFFERENTIAL/PLATELET
BASOS ABS: 0.1 10*3/uL (ref 0.0–0.1)
Basophils Relative: 1 %
EOS ABS: 0.2 10*3/uL (ref 0.0–0.5)
EOS PCT: 5 %
HCT: 33.9 % — ABNORMAL LOW (ref 38.4–49.9)
Hemoglobin: 11.7 g/dL — ABNORMAL LOW (ref 13.0–17.1)
LYMPHS PCT: 24 %
Lymphs Abs: 1.1 10*3/uL (ref 0.9–3.3)
MCH: 33.6 pg — ABNORMAL HIGH (ref 27.2–33.4)
MCHC: 34.4 g/dL (ref 32.0–36.0)
MCV: 97.7 fL (ref 79.3–98.0)
MONO ABS: 0.5 10*3/uL (ref 0.1–0.9)
Monocytes Relative: 11 %
Neutro Abs: 2.6 10*3/uL (ref 1.5–6.5)
Neutrophils Relative %: 59 %
PLATELETS: 215 10*3/uL (ref 140–400)
RBC: 3.47 MIL/uL — ABNORMAL LOW (ref 4.20–5.82)
RDW: 15.3 % — AB (ref 11.0–14.6)
WBC: 4.4 10*3/uL (ref 4.0–10.3)

## 2017-08-04 LAB — IRON AND TIBC
Iron: 104 ug/dL (ref 42–163)
Saturation Ratios: 40 % — ABNORMAL LOW (ref 42–163)
TIBC: 259 ug/dL (ref 202–409)
UIBC: 155 ug/dL

## 2017-08-04 LAB — FERRITIN: Ferritin: 67 ng/mL (ref 22–316)

## 2017-08-04 NOTE — Assessment & Plan Note (Signed)
He had borderline recurrent iron deficiency anemia We discussed oral iron supplementation

## 2017-08-04 NOTE — Telephone Encounter (Signed)
Gave avs and calendar along with contrast

## 2017-08-04 NOTE — Assessment & Plan Note (Signed)
Overall, there is no data to support adjuvant Gleevec due to low mitotic index and small size of tumor, despite positive margin. I recommend close observation with imaging study again in a few months. Recent EGD and biopsy was negative I will see him back in 3 months for blood work, history and physical examination along with imaging study

## 2017-08-04 NOTE — Assessment & Plan Note (Signed)
He complained of mild osteoarthritis I recommend conservative management with over-the-counter analgesics and supplements

## 2017-08-04 NOTE — Assessment & Plan Note (Signed)
Surgical resection was complete. He does not require adjuvant treatment. He will continue close follow-up with urologist As above, I plan to repeat imaging study in 3 months

## 2017-08-04 NOTE — Progress Notes (Signed)
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 I recommend close observation with imaging study again in a few months. 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 He does not require adjuvant treatment. He will continue close follow-up with urologist As above, I plan to repeat imaging study in 3 months  Deficiency anemia He had borderline recurrent iron deficiency anemia We discussed oral iron supplementation  Osteoarthritis of both hands He complained of mild osteoarthritis I recommend conservative management with over-the-counter analgesics and supplements   Orders Placed This Encounter  Procedures  . CT Abdomen Pelvis Wo Contrast    Standing Status:   Future    Standing Expiration Date:   08/04/2018    Order Specific Question:   Preferred imaging location?    Answer:   [facility]    Order Specific Question:   Is Oral Contrast requested for this exam?    Answer:   Yes, Per Radiology protocol    Order Specific Question:   Radiology Contrast Protocol - do NOT remove file path    Answer:   [email or web address]  . Comprehensive metabolic panel    Standing  Status:   Future    Standing Expiration Date:   09/08/2018  . CBC with Differential/Platelet    Standing Status:   Future    Standing Expiration Date:   09/08/2018  . Ferritin    Standing Status:   Future    Standing Expiration Date:   08/04/2018  . Iron and TIBC    Standing Status:   Future    Standing Expiration Date:   09/08/2018    INTERVAL HISTORY: Please see below for problem oriented charting. He returns for further follow-up He is doing very well He had recent negative EGD evaluation Denies hematuria No abdominal pain No changes in bowel habits The patient denies any recent signs or symptoms of bleeding such as spontaneous epistaxis, hematuria or hematochezia.  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       06/29/2015 Imaging    MRI abdomen showed 1.9 cm enhancing lesion along the lateral left lower kidney, corresponding to the CT abnormality, suspicious for solid renal neoplasm such as renal cell carcinoma  07/05/2015 Procedure    EUS confirmed 2.6 cm mass opposite opening of the ampulla      07/05/2015 Pathology Results    Accession: [id number] FNA was positive for GIST      10/17/2015 Surgery    He underwent combined stomach resection and kidney resection      10/17/2015 Pathology Results    Accession: [id number] pathology from stomach revealed 3.5 cm GIST with low mitotic rate but positive margin. He also  has papillary cell carcinoma involving the kidney      10/16/2016 Imaging    1. Status post resection of a duodenum GIST. No findings for residual or recurrent tumor or metastatic disease. 2. Status post partial left nephrectomy for small renal cell carcinoma. No findings for recurrent tumor or metastatic disease. 3. Numerous bilateral renal cysts, some of which are hyperdense/hemorrhagic. No worrisome renal lesions. 4. Markedly enlarged prostate gland. 5. Small pericardial effusion and scattered mediastinal lymph nodes but no mass or overt adenopathy. 6. Benign-appearing hepatic cysts.      02/05/2017 Imaging    Ct scan of abdomen and pelvis showed no evidence of recurrent kidney cancer or GIST tumor. Probable small bowel enteritis      06/08/2017 Pathology Results    Duodenum, Biopsy - PEPTIC DUODENITIS - NO DYSPLASIA OR MALIGNANCY IDENTIFIED      06/08/2017 Procedure    There was evidence of a patent previous surgical anastomosis in the first portion of the duodenum. This was characterized by healthy appearing mucosa. Biopsies were taken with a cold forceps for histology. Verification of patient identification for the specimen was done. Estimated blood loss was minimal. Findings: A non-obstructing Schatzki ring was found at the gastroesophageal junction. A small hiatal hernia was present. The cardia and gastric fundus were normal on retroflexion. The exam was otherwise without abnormality. - Patent previous surgical anastomosis, characterized by healthy appearing mucosa was found in the duodenum. Biopsied. Was not photogtraphed - Non-obstructing Schatzki ring. - Small hiatal hernia. 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   10/17/2015 Pathology Results    Accession: [id number] kidney pathology 1.9 cm papillary carcinoma      10/19/2015 Initial Diagnosis    Renal cell carcinoma of left kidney s/p partial  nephrectomy 10/17/2015       REVIEW OF SYSTEMS:   Constitutional: Denies fevers, chills or abnormal weight loss Eyes: Denies blurriness of vision Ears, nose, mouth, throat, and face: Denies mucositis or sore throat Respiratory: Denies cough, dyspnea or wheezes Cardiovascular: Denies palpitation, chest discomfort or lower extremity swelling Gastrointestinal:  Denies nausea, heartburn or change in bowel habits Skin: Denies abnormal skin rashes Lymphatics: Denies new lymphadenopathy or easy bruising Neurological:Denies numbness, tingling or new weaknesses Behavioral/Psych: Mood is stable, no new changes  All other systems were reviewed with the patient and are negative.  I have reviewed the past medical history, past surgical history, social history and family history with the patient and they are unchanged from previous note.  ALLERGIES:  has No Known Allergies.  MEDICATIONS:  Current Outpatient Medications  Medication Sig Dispense Refill  . ARTIFICIAL TEAR OINTMENT OP Place 1 application into both eyes daily as needed (For dry eyes.).    Marland Kitchen aspirin EC 81 MG tablet Take 81 mg by mouth daily.    . dorzolamide (TRUSOPT) 2 % ophthalmic solution Place 1 drop into both eyes 2 (two) times daily.     . ferrous sulfate  325 (65 FE) MG tablet Take 1 tablet (325 mg total) by mouth daily. 90 tablet 3  . finasteride (PROSCAR) 5 MG tablet Take 5 mg by mouth every evening.   3  . lisinopril (PRINIVIL,ZESTRIL) 20 MG tablet Take 20 mg by mouth every morning.     . Multiple Vitamins-Minerals (MULTIVITAMINS THER. W/MINERALS) TABS tablet Take 1 tablet by mouth daily.    . Omega-3 Fatty Acids (FISH OIL PO) Take 4 capsules by mouth daily.    Marland Kitchen omeprazole (PRILOSEC) 20 MG capsule Take 20 mg by mouth daily.    . simvastatin (ZOCOR) 20 MG tablet Take 20 mg by mouth at bedtime.     . travoprost, benzalkonium, (TRAVATAN) 0.004 % ophthalmic solution Place 1 drop into both eyes at bedtime.      No current  facility-administered medications for this visit.     PHYSICAL EXAMINATION: ECOG PERFORMANCE STATUS: 0 - Asymptomatic  Vitals:   08/04/17 0839  BP: (!) 107/52  Pulse: 70  Resp: 18  Temp: 98 F (36.7 C)  SpO2: 100%   Filed Weights   08/04/17 0839  Weight: 220 lb 6.4 oz (100 kg)    GENERAL:alert, no distress and comfortable SKIN: skin color, texture, turgor are normal, no rashes or significant lesions EYES: normal, Conjunctiva are pink and non-injected, sclera clear OROPHARYNX:no exudate, no erythema and lips, buccal mucosa, and tongue normal  NECK: supple, thyroid normal size, non-tender, without nodularity LYMPH:  no palpable lymphadenopathy in the cervical, axillary or inguinal LUNGS: clear to auscultation and percussion with normal breathing effort HEART: regular rate & rhythm and no murmurs and no lower extremity edema ABDOMEN:abdomen soft, non-tender and normal bowel sounds Musculoskeletal:no cyanosis of digits and no clubbing  NEURO: alert & oriented x 3 with fluent speech, no focal motor/sensory deficits  LABORATORY DATA:  I have reviewed the data as listed    Component Value Date/Time   NA 139 08/04/2017 0815   NA 140 10/16/2016 0816   K 4.0 08/04/2017 0815   K 4.1 10/16/2016 0816   CL 109 08/04/2017 0815   CO2 22 08/04/2017 0815   CO2 24 10/16/2016 0816   GLUCOSE 116 08/04/2017 0815   GLUCOSE 96 10/16/2016 0816   BUN 23 08/04/2017 0815   BUN 17.4 10/16/2016 0816   CREATININE 1.65 (H) 08/04/2017 0815   CREATININE 1.8 (H) 10/16/2016 0816   CALCIUM 9.1 08/04/2017 0815   CALCIUM 9.6 10/16/2016 0816   PROT 7.0 08/04/2017 0815   PROT 7.2 10/16/2016 0816   ALBUMIN 3.7 08/04/2017 0815   ALBUMIN 3.5 10/16/2016 0816   AST 19 08/04/2017 0815   AST 20 10/16/2016 0816   ALT 15 08/04/2017 0815   ALT 16 10/16/2016 0816   ALKPHOS 81 08/04/2017 0815   ALKPHOS 69 10/16/2016 0816   BILITOT 0.4 08/04/2017 0815   BILITOT 0.44 10/16/2016 0816   GFRNONAA 39 (L)  08/04/2017 0815   GFRAA 45 (L) 08/04/2017 0815    No results found for: SPEP, UPEP  Lab Results  Component Value Date   WBC 4.4 08/04/2017   NEUTROABS 2.6 08/04/2017   HGB 11.7 (L) 08/04/2017   HCT 33.9 (L) 08/04/2017   MCV 97.7 08/04/2017   PLT 215 08/04/2017      Chemistry      Component Value Date/Time   NA 139 08/04/2017 0815   NA 140 10/16/2016 0816   K 4.0 08/04/2017 0815   K 4.1 10/16/2016 0816   CL 109 08/04/2017 0815  CO2 22 08/04/2017 0815   CO2 24 10/16/2016 0816   BUN 23 08/04/2017 0815   BUN 17.4 10/16/2016 0816   CREATININE 1.65 (H) 08/04/2017 0815   CREATININE 1.8 (H) 10/16/2016 0816      Component Value Date/Time   CALCIUM 9.1 08/04/2017 0815   CALCIUM 9.6 10/16/2016 0816   ALKPHOS 81 08/04/2017 0815   ALKPHOS 69 10/16/2016 0816   AST 19 08/04/2017 0815   AST 20 10/16/2016 0816   ALT 15 08/04/2017 0815   ALT 16 10/16/2016 0816   BILITOT 0.4 08/04/2017 0815   BILITOT 0.44 10/16/2016 0816     All questions were answered. The patient knows to call the clinic with any problems, questions or concerns. No barriers to learning was detected.  I spent 15 minutes counseling the patient face to face. The total time spent in the appointment was 20 minutes and more than 50% was on counseling and review of test results  [name], MD 08/04/2017 11:25 AM

## 2017-09-09 ENCOUNTER — Telehealth: Payer: Self-pay | Admitting: Hematology and Oncology

## 2017-09-09 NOTE — Telephone Encounter (Signed)
Left vm re appt being shifted on 9/5 - sending confirmation letter in mail.

## 2017-09-23 ENCOUNTER — Ambulatory Visit (INDEPENDENT_AMBULATORY_CARE_PROVIDER_SITE_OTHER): Payer: Medicare HMO | Admitting: Orthopaedic Surgery

## 2017-09-23 ENCOUNTER — Encounter (INDEPENDENT_AMBULATORY_CARE_PROVIDER_SITE_OTHER): Payer: Self-pay | Admitting: Orthopaedic Surgery

## 2017-09-23 VITALS — BP 131/71 | HR 61 | Ht 74.0 in | Wt 220.0 lb

## 2017-09-23 DIAGNOSIS — M65311 Trigger thumb, right thumb: Secondary | ICD-10-CM | POA: Diagnosis not present

## 2017-09-23 DIAGNOSIS — M7581 Other shoulder lesions, right shoulder: Secondary | ICD-10-CM | POA: Diagnosis not present

## 2017-09-23 DIAGNOSIS — S46219A Strain of muscle, fascia and tendon of other parts of biceps, unspecified arm, initial encounter: Secondary | ICD-10-CM

## 2017-09-23 NOTE — Progress Notes (Signed)
Office Visit Note   Patient: Mike Roach           Date of Birth: 10-15-1940           MRN: [id number] Visit Date: 09/23/2017              Requested by: Mia Creek, NP [place], [place] [id number] PCP: Mia Creek, NP   Assessment & Plan: Visit Diagnoses:  1. Biceps tendon tear   2. Trigger thumb, right thumb   3. Right rotator cuff tendonitis     Plan:  #1: We will obtain a MRI scan of the right shoulder to rule out rotator cuff tear #2: We will place his right thumb in an AlumaFoam splint for the next week #3: Return in 1 week for review of MRI scan and possible injection right trigger thumb   Follow-Up Instructions: Return in about 1 week (around 09/30/2017).   Orders:  No orders of the defined types were placed in this encounter.  No orders of the defined types were placed in this encounter.     Procedures: No procedures performed   Clinical Data: No additional findings.   Subjective: Chief Complaint  Patient presents with  . New Patient (Initial Visit)    R ARM PAIN WAS LIFTING BAG OF DOG FOOD AND HEARD A POPIN HIS ARM    HPI  Mike Roach is a 77 year old African-American male today with right upper shoulder and arm pain as well as popping of the right thumb.  In regards to the right shoulder he states an injury February 16, 2017 lifting a dog bag over his head felt and heard a pop in his right shoulder.  He was seen at [facility] gave him medications but he did not get any better.  He is continued to have pain discomfort in the shoulder area.  Is also noted prominence of the biceps muscle distal portion.  He has developed popping of the right thumb reaching and pushing down his phone onto his belt loop about 1 month ago.  He has become more prominent now and does have a little bit of pain in the area of the volar aspect of the thumb.  Denies any neurovascular compromise.  Review of Systems  Constitutional: Negative for fatigue and  fever.  HENT: Negative for ear pain.   Eyes: Negative for pain.  Respiratory: Negative for cough and shortness of breath.   Cardiovascular: Negative for leg swelling.  Gastrointestinal: Negative for constipation and diarrhea.  Genitourinary: Negative for difficulty urinating.  Musculoskeletal: Negative for back pain and neck pain.  Skin: Negative for rash.  Allergic/Immunologic: Negative for food allergies.  Neurological: Positive for weakness. Negative for numbness.  Hematological: Does not bruise/bleed easily.  Psychiatric/Behavioral: Negative for sleep disturbance.     Objective: Vital Signs: BP 131/71 (BP Location: Right Arm, Patient Position: Sitting, Cuff Size: Normal)   Pulse 61   Ht 6\' 2"  (1.88 m)   Wt 220 lb (99.8 kg)   BMI 28.25 kg/m   Physical Exam  Constitutional: He is oriented to person, place, and time. He appears well-developed and well-nourished.  HENT:  Mouth/Throat: Oropharynx is clear and moist.  Eyes: Pupils are equal, round, and reactive to light. EOM are normal.  Pulmonary/Chest: Effort normal.  Neurological: He is alert and oriented to person, place, and time.  Skin: Skin is warm and dry.  Psychiatric: He has a normal mood and affect. His behavior is normal.  Ortho Exam  Examination of the right shoulder reveals good forward flexion and abduction.  External rotation with arm at 90 degrees of abduction degrees internal rotation to 60 degrees.  Negative empty can test.  Good strength in the shoulder.  The right thumb reveals a volar nodule with popping with flexion and extension.  Specialty Comments:  No specialty comments available.  Imaging: No results found. X-rays reviewed of his right shoulder previously performed reveals type II acromium and AC OA.  Glenohumeral narrowing with inferior glenoid spurring.  PMFS History: Current Outpatient Medications  Medication Sig Dispense Refill  . ARTIFICIAL TEAR OINTMENT OP Place 1 application into  both eyes daily as needed (For dry eyes.).    Marland Kitchen aspirin EC 81 MG tablet Take 81 mg by mouth daily.    . dorzolamide (TRUSOPT) 2 % ophthalmic solution Place 1 drop into both eyes 2 (two) times daily.     . ferrous sulfate 325 (65 FE) MG tablet Take 1 tablet (325 mg total) by mouth daily. 90 tablet 3  . finasteride (PROSCAR) 5 MG tablet Take 5 mg by mouth every evening.   3  . lisinopril (PRINIVIL,ZESTRIL) 20 MG tablet Take 20 mg by mouth every morning.     . Multiple Vitamins-Minerals (MULTIVITAMINS THER. W/MINERALS) TABS tablet Take 1 tablet by mouth daily.    . Omega-3 Fatty Acids (FISH OIL PO) Take 4 capsules by mouth daily.    Marland Kitchen omeprazole (PRILOSEC) 20 MG capsule Take 20 mg by mouth daily.    . simvastatin (ZOCOR) 20 MG tablet Take 20 mg by mouth at bedtime.     . travoprost, benzalkonium, (TRAVATAN) 0.004 % ophthalmic solution Place 1 drop into both eyes at bedtime.      No current facility-administered medications for this visit.     Patient Active Problem List   Diagnosis Date Noted  . 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  . Biceps tendon rupture, proximal, left, initial encounter 06/01/2017  . Deficiency anemia 10/17/2016  . 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 Hypertension   . GERD (gastroesophageal reflux disease)   . 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 Kidney lesion, native, left 07/12/2015  . Iron deficiency anemia 03/15/2015  . Bilateral hearing loss 03/15/2015  . Skin lesion of left leg 03/07/2015  . Anemia in chronic illness 03/07/2015  . Chronic kidney disease, stage III (moderate) ([place]) 03/07/2015  . Chronic kidney disease (CKD) 03/07/2015  . Adenomatous colon polyp 09/21/2014  . Lateral meniscus derangement 01/12/2013  . Osteoarthritis of right knee 01/12/2013   Past Medical History:  Diagnosis Date  .  Adenomatous colon polyp 09/21/2014  . Anemia in chronic illness 03/07/2015   being evaluated  . Arthritis   . Bilateral hearing loss 03/15/2015  . Chronic kidney disease   . Chronic kidney disease (CKD) 03/07/2015  . GERD (gastroesophageal reflux disease)   . GIST (gastrointestinal stroma tumor), malignant, colon ([place])    Duodenom,   . Glaucoma    both eyes  . Hypercholesterolemia   . Hypertension   . Iron deficiency anemia 03/15/2015  . Prostate enlargement   . Skin lesion of left leg 03/07/2015    History reviewed. No pertinent family history.  Past Surgical History:  Procedure Laterality Date  . colonoscopy    . ESOPHAGOGASTRODUODENOSCOPY (EGD) WITH PROPOFOL N/A 06/08/2017   Procedure: ESOPHAGOGASTRODUODENOSCOPY (EGD) WITH PROPOFOL;  Surgeon: [name], MD;  Location: [facility];  Service: Endoscopy;  Laterality: N/A;  . EUS N/A 07/05/2015   Procedure: UPPER ENDOSCOPIC ULTRASOUND (EUS) LINEAR;  Surgeon: Milus Banister, MD;  Location: [facility];  Service: Endoscopy;  Laterality: N/A;  . KNEE ARTHROSCOPY WITH LATERAL MENISECTOMY Right 01/21/2013   Procedure: KNEE ARTHROSCOPY WITH PARTIAL LATERAL MENISECTOMY;  Surgeon: Carole Civil, MD;  Location: [facility];  Service: Orthopedics;  Laterality: Right;  . ORIF ANKLE DISLOCATION Right 15 yrs ago  . ROBOTIC ASSITED PARTIAL NEPHRECTOMY Left 10/17/2015   Procedure: XI ROBOTIC ASSITED LEFT PARTIAL NEPHRECTOMY, Left Cyst Decortication, Left Renal Ultrasound;  Surgeon: [name], MD;  Location: [facility];  Service: Urology;  Laterality: Left;   Social History   Occupational History  . Occupation: Security guard  Tobacco Use  . Smoking status: Former Smoker    Packs/day: 1.00    Years: 20.00    Pack years: 20.00    Types: Cigarettes    Last attempt to quit: 01/19/1981    Years since quitting: 36.7  . Smokeless tobacco: Never Used  Substance and Sexual Activity  . Alcohol use: No  . Drug use: No  . 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

## 2017-09-29 ENCOUNTER — Ambulatory Visit: Payer: Medicare HMO | Admitting: Urology

## 2017-09-29 DIAGNOSIS — C642 Malignant neoplasm of left kidney, except renal pelvis: Secondary | ICD-10-CM

## 2017-09-29 DIAGNOSIS — N5201 Erectile dysfunction due to arterial insufficiency: Secondary | ICD-10-CM | POA: Diagnosis not present

## 2017-09-29 DIAGNOSIS — R972 Elevated prostate specific antigen [PSA]: Secondary | ICD-10-CM | POA: Diagnosis not present

## 2017-09-30 ENCOUNTER — Other Ambulatory Visit (INDEPENDENT_AMBULATORY_CARE_PROVIDER_SITE_OTHER): Payer: Self-pay | Admitting: Radiology

## 2017-09-30 DIAGNOSIS — M25511 Pain in right shoulder: Principal | ICD-10-CM

## 2017-09-30 DIAGNOSIS — G8929 Other chronic pain: Secondary | ICD-10-CM

## 2017-10-12 ENCOUNTER — Telehealth (INDEPENDENT_AMBULATORY_CARE_PROVIDER_SITE_OTHER): Payer: Self-pay | Admitting: Orthopedic Surgery

## 2017-10-12 NOTE — Telephone Encounter (Signed)
09/23/2017 OV note faxed to [facility] [phone]

## 2017-10-14 ENCOUNTER — Telehealth: Payer: Self-pay

## 2017-10-14 ENCOUNTER — Ambulatory Visit (INDEPENDENT_AMBULATORY_CARE_PROVIDER_SITE_OTHER): Payer: Medicare HMO | Admitting: Orthopaedic Surgery

## 2017-10-14 ENCOUNTER — Encounter (INDEPENDENT_AMBULATORY_CARE_PROVIDER_SITE_OTHER): Payer: Self-pay | Admitting: Orthopaedic Surgery

## 2017-10-14 VITALS — BP 110/64 | HR 57 | Ht 74.0 in | Wt 220.0 lb

## 2017-10-14 DIAGNOSIS — M25511 Pain in right shoulder: Secondary | ICD-10-CM

## 2017-10-14 DIAGNOSIS — G8929 Other chronic pain: Secondary | ICD-10-CM | POA: Diagnosis not present

## 2017-10-14 NOTE — Telephone Encounter (Signed)
Called back and told him authorization is still pending for scan. Radiology usually calls and schedules with the patient one to two weeks prior to expected date. Instructed to call office the week of 8/26 if no one calls to schedule scan. He verbalized understanding.

## 2017-10-14 NOTE — Progress Notes (Signed)
Office Visit Note   Patient: Mike Roach           Date of Birth: 17-Nov-1940           MRN: [id number] Visit Date: 10/14/2017              Requested by: Mia Creek, NP [place], [address] [place] [id number] PCP: Mia Creek, NP   Assessment & Plan: Visit Diagnoses:  1. Chronic right shoulder pain     Plan: MRI scan of right shoulder demonstrates extensive articular surface tearing involving the supraspinatus tendon.  There is a full-thickness tear of the anterior attachment with a maximum of 14 mm of retraction.  The tear is approximately 7 mm wide.  The infraspinatus and subscapularis tendons are intact.  Biceps long head it was not demonstrable.  There are degenerative changes at the acromioclavicular joint and the glenohumeral joint.  Long discussion with Mike Roach regarding the above.  My suggestion would be arthroscopic SCD, DCR and a mini open rotator cuff tear repair.  I discussed this in detail with him including the surgery at time out of work limitations of activities use of the sling.  Etc.  He needs to work out his his employment schedule and then give [place] a call.  I will order physical therapy in the interim  Follow-Up Instructions: Return if symptoms worsen or fail to improve.   Orders:  No orders of the defined types were placed in this encounter.  No orders of the defined types were placed in this encounter.     Procedures: No procedures performed   Clinical Data: No additional findings.   Subjective: Chief Complaint  Patient presents with  . Follow-up    MRI R SHOULDER REVIEW  Mike Roach relates onset of right shoulder pain in December 2018 after lifting a 50 pound bag of dog food.  He is been evaluated elsewhere and was told to take anti-inflammatory medicines and perform some exercises.  Persistent pain to the point of compromise.  Was last here I ordered an MRI scan.  That scan demonstrates a tear of the supraspinatus tendon with slight  retraction.  There are some degenerative changes at the glenohumeral joint as well as the acromioclavicular joint he is still working full-time as a Art gallery manager and insecurity at [employer].  Like to consider the surgery but wants to schedule.  HPI  Review of Systems  Constitutional: Negative for fatigue and fever.  HENT: Negative for ear pain.   Eyes: Negative for pain.  Respiratory: Negative for cough and shortness of breath.   Cardiovascular: Negative for leg swelling.  Gastrointestinal: Negative for constipation and diarrhea.  Genitourinary: Negative for difficulty urinating.  Musculoskeletal: Negative for neck pain.  Skin: Negative for rash.  Allergic/Immunologic: Negative for food allergies.  Neurological: Positive for weakness.  Hematological: Does not bruise/bleed easily.  Psychiatric/Behavioral: Negative for sleep disturbance.     Objective: Vital Signs: BP 110/64 (BP Location: Left Arm, Patient Position: Sitting, Cuff Size: Normal)   Pulse (!) 57   Ht 6\' 2"  (1.88 m)   Wt 220 lb (99.8 kg)   BMI 28.25 kg/m   Physical Exam  Constitutional: He is oriented to person, place, and time. He appears well-developed and well-nourished.  HENT:  Mouth/Throat: Oropharynx is clear and moist.  Eyes: Pupils are equal, round, and reactive to light. EOM are normal.  Pulmonary/Chest: Effort normal.  Neurological: He is alert and oriented to person, place, and time.  Skin: Skin is warm and dry.  Psychiatric: He has a normal mood and affect. His behavior is normal.    Ortho Exam awake alert and oriented x3.  Comfortable sitting.  Positive impingement on the extremes of internal and external rotation.  He can test negative.  Good strength but pain with external rotation and the strength testing position.  Able to place his arm over his head with somewhat of a circuitous motion.  No evidence of adhesive capsulitis or instability.  Biceps is atrophied and in a distal position.  Negative  speeds sign.  Does have some pain in the anterior subacromial region and minimally over the acromioclavicular joint  Specialty Comments:  No specialty comments available.  Imaging: No results found.   PMFS History: Patient Active Problem List   Diagnosis Date Noted  . 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  . Biceps tendon rupture, proximal, left, initial encounter 06/01/2017  . Deficiency anemia 10/17/2016  . 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 Hypertension   . GERD (gastroesophageal reflux disease)   . 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 Kidney lesion, native, left 07/12/2015  . Iron deficiency anemia 03/15/2015  . Bilateral hearing loss 03/15/2015  . Skin lesion of left leg 03/07/2015  . Anemia in chronic illness 03/07/2015  . Chronic kidney disease, stage III (moderate) ([place]) 03/07/2015  . Chronic kidney disease (CKD) 03/07/2015  . Adenomatous colon polyp 09/21/2014  . Lateral meniscus derangement 01/12/2013  . Osteoarthritis of right knee 01/12/2013   Past Medical History:  Diagnosis Date  . Adenomatous colon polyp 09/21/2014  . Anemia in chronic illness 03/07/2015   being evaluated  . Arthritis   . Bilateral hearing loss 03/15/2015  . Chronic kidney disease   . Chronic kidney disease (CKD) 03/07/2015  . GERD (gastroesophageal reflux disease)   . GIST (gastrointestinal stroma tumor), malignant, colon ([place])    Duodenom,   . Glaucoma    both eyes  . Hypercholesterolemia   . Hypertension   . Iron deficiency anemia 03/15/2015  . Prostate enlargement   . Skin lesion of left leg 03/07/2015    History reviewed. No pertinent family history.  Past Surgical History:  Procedure Laterality Date  . colonoscopy    . ESOPHAGOGASTRODUODENOSCOPY (EGD) WITH PROPOFOL N/A 06/08/2017   Procedure: ESOPHAGOGASTRODUODENOSCOPY (EGD)  WITH PROPOFOL;  Surgeon: [name], MD;  Location: [facility];  Service: Endoscopy;  Laterality: N/A;  . EUS N/A 07/05/2015   Procedure: UPPER ENDOSCOPIC ULTRASOUND (EUS) LINEAR;  Surgeon: Milus Banister, MD;  Location: [facility];  Service: Endoscopy;  Laterality: N/A;  . KNEE ARTHROSCOPY WITH LATERAL MENISECTOMY Right 01/21/2013   Procedure: KNEE ARTHROSCOPY WITH PARTIAL LATERAL MENISECTOMY;  Surgeon: Carole Civil, MD;  Location: [facility];  Service: Orthopedics;  Laterality: Right;  . ORIF ANKLE DISLOCATION Right 15 yrs ago  . ROBOTIC ASSITED PARTIAL NEPHRECTOMY Left 10/17/2015   Procedure: XI ROBOTIC ASSITED LEFT PARTIAL NEPHRECTOMY, Left Cyst Decortication, Left Renal Ultrasound;  Surgeon: [name], MD;  Location: [facility];  Service: Urology;  Laterality: Left;   Social History   Occupational History  . Occupation: Security guard  Tobacco Use  . Smoking status: Former Smoker    Packs/day: 1.00    Years: 20.00    Pack years: 20.00    Types: Cigarettes    Last attempt to quit: 01/19/1981    Years  since quitting: 36.7  . Smokeless tobacco: Never Used  Substance and Sexual Activity  . Alcohol use: No  . Drug use: No  . 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

## 2017-10-14 NOTE — Telephone Encounter (Signed)
He called and left a message regarding scan not being scheduled for 9/5.

## 2017-10-28 ENCOUNTER — Telehealth (INDEPENDENT_AMBULATORY_CARE_PROVIDER_SITE_OTHER): Payer: Self-pay | Admitting: Orthopaedic Surgery

## 2017-10-28 NOTE — Telephone Encounter (Signed)
Patient will call me to schedule right shoulder surgery sometime after the new year.  He has my name and direct number to schedule

## 2017-10-28 NOTE — Telephone Encounter (Signed)
Just an fyi

## 2017-10-29 NOTE — Telephone Encounter (Signed)
thanks

## 2017-11-03 ENCOUNTER — Telehealth: Payer: Self-pay | Admitting: *Deleted

## 2017-11-03 NOTE — Telephone Encounter (Signed)
ROI faxed to [facility]; release [phone]

## 2017-11-05 ENCOUNTER — Inpatient Hospital Stay: Payer: Medicare HMO | Attending: Hematology and Oncology

## 2017-11-05 ENCOUNTER — Encounter (HOSPITAL_COMMUNITY): Payer: Self-pay

## 2017-11-05 ENCOUNTER — Ambulatory Visit (HOSPITAL_COMMUNITY)
Admission: RE | Admit: 2017-11-05 | Discharge: 2017-11-05 | Disposition: A | Discharge status: Home / Self Care | Payer: Medicare HMO | Source: Ambulatory Visit | Attending: Hematology and Oncology | Admitting: Hematology and Oncology

## 2017-11-05 DIAGNOSIS — N183 Chronic kidney disease, stage 3 (moderate): Secondary | ICD-10-CM | POA: Insufficient documentation

## 2017-11-05 DIAGNOSIS — C642 Malignant neoplasm of left kidney, except renal pelvis: Secondary | ICD-10-CM | POA: Insufficient documentation

## 2017-11-05 DIAGNOSIS — Z85528 Personal history of other malignant neoplasm of kidney: Secondary | ICD-10-CM | POA: Diagnosis not present

## 2017-11-05 DIAGNOSIS — N281 Cyst of kidney, acquired: Secondary | ICD-10-CM | POA: Insufficient documentation

## 2017-11-05 DIAGNOSIS — Z905 Acquired absence of kidney: Secondary | ICD-10-CM | POA: Diagnosis not present

## 2017-11-05 DIAGNOSIS — C49A3 Gastrointestinal stromal tumor of small intestine: Secondary | ICD-10-CM

## 2017-11-05 DIAGNOSIS — D638 Anemia in other chronic diseases classified elsewhere: Secondary | ICD-10-CM | POA: Diagnosis present

## 2017-11-05 DIAGNOSIS — N400 Enlarged prostate without lower urinary tract symptoms: Secondary | ICD-10-CM | POA: Diagnosis not present

## 2017-11-05 DIAGNOSIS — Z8509 Personal history of malignant neoplasm of other digestive organs: Secondary | ICD-10-CM | POA: Insufficient documentation

## 2017-11-05 LAB — COMPREHENSIVE METABOLIC PANEL
ALBUMIN: 3.7 g/dL (ref 3.5–5.0)
ALT: 18 U/L (ref 0–44)
AST: 20 U/L (ref 15–41)
Alkaline Phosphatase: 64 U/L (ref 38–126)
Anion gap: 7 (ref 5–15)
BUN: 22 mg/dL (ref 8–23)
CHLORIDE: 109 mmol/L (ref 98–111)
CO2: 24 mmol/L (ref 22–32)
Calcium: 9.4 mg/dL (ref 8.9–10.3)
Creatinine, Ser: 1.75 mg/dL — ABNORMAL HIGH (ref 0.61–1.24)
GFR calc Af Amer: 42 mL/min — ABNORMAL LOW (ref >60)
GFR, EST NON AFRICAN AMERICAN: 36 mL/min — AB (ref >60)
GLUCOSE: 101 mg/dL — AB (ref 70–99)
POTASSIUM: 5 mmol/L (ref 3.5–5.1)
SODIUM: 140 mmol/L (ref 135–145)
Total Bilirubin: 0.6 mg/dL (ref 0.3–1.2)
Total Protein: 7.1 g/dL (ref 6.5–8.1)

## 2017-11-05 LAB — CBC WITH DIFFERENTIAL/PLATELET
BASOS ABS: 0 10*3/uL (ref 0.0–0.1)
BASOS PCT: 1 %
EOS ABS: 0.3 10*3/uL (ref 0.0–0.5)
EOS PCT: 7 %
HCT: 35.1 % — ABNORMAL LOW (ref 38.4–49.9)
Hemoglobin: 11.8 g/dL — ABNORMAL LOW (ref 13.0–17.1)
Lymphocytes Relative: 31 %
Lymphs Abs: 1.4 10*3/uL (ref 0.9–3.3)
MCH: 33.5 pg — ABNORMAL HIGH (ref 27.2–33.4)
MCHC: 33.6 g/dL (ref 32.0–36.0)
MCV: 99.7 fL — ABNORMAL HIGH (ref 79.3–98.0)
MONO ABS: 0.6 10*3/uL (ref 0.1–0.9)
Monocytes Relative: 13 %
Neutro Abs: 2.2 10*3/uL (ref 1.5–6.5)
Neutrophils Relative %: 48 %
PLATELETS: 194 10*3/uL (ref 140–400)
RBC: 3.52 MIL/uL — ABNORMAL LOW (ref 4.20–5.82)
RDW: 14.3 % (ref 11.0–14.6)
WBC: 4.6 10*3/uL (ref 4.0–10.3)

## 2017-11-05 LAB — IRON AND TIBC
IRON: 107 ug/dL (ref 42–163)
Saturation Ratios: 40 % — ABNORMAL LOW (ref 42–163)
TIBC: 265 ug/dL (ref 202–409)
UIBC: 158 ug/dL

## 2017-11-05 LAB — FERRITIN: FERRITIN: 105 ng/mL (ref 24–336)

## 2017-11-06 ENCOUNTER — Inpatient Hospital Stay: Payer: Medicare HMO | Admitting: Hematology and Oncology

## 2017-11-06 ENCOUNTER — Encounter: Payer: Self-pay | Admitting: Hematology and Oncology

## 2017-11-06 ENCOUNTER — Telehealth: Payer: Self-pay | Admitting: Hematology and Oncology

## 2017-11-06 DIAGNOSIS — C49A3 Gastrointestinal stromal tumor of small intestine: Secondary | ICD-10-CM

## 2017-11-06 DIAGNOSIS — Z85528 Personal history of other malignant neoplasm of kidney: Secondary | ICD-10-CM | POA: Diagnosis not present

## 2017-11-06 DIAGNOSIS — N183 Chronic kidney disease, stage 3 unspecified: Secondary | ICD-10-CM

## 2017-11-06 DIAGNOSIS — Z8509 Personal history of malignant neoplasm of other digestive organs: Secondary | ICD-10-CM | POA: Diagnosis not present

## 2017-11-06 DIAGNOSIS — C642 Malignant neoplasm of left kidney, except renal pelvis: Secondary | ICD-10-CM

## 2017-11-06 DIAGNOSIS — D638 Anemia in other chronic diseases classified elsewhere: Secondary | ICD-10-CM

## 2017-11-06 NOTE — Progress Notes (Signed)
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 His anemia is stable. He does not require any further workup or treatment now.  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 He does not require adjuvant treatment. He will continue close follow-up with urologist Recent CT imaging show no evidence of cancer recurrence.   Orders Placed This Encounter  Procedures  . CT Abdomen Pelvis Wo Contrast    Standing Status:   Future    Standing Expiration Date:   12/11/2018    Order Specific Question:   ** REASON FOR EXAM (FREE TEXT)    Answer:   Hx GIST tumor and kidney cancer. OK oral contrast    Order Specific Question:   Preferred imaging location?    Answer:   [facility]    Order Specific Question:   Is Oral Contrast requested for this exam?    Answer:   Yes, Per Radiology protocol    Order Specific Question:   Radiology Contrast Protocol - do NOT remove file path    Answer:   [email or web address]  . Ferritin    Standing Status:    Future    Standing Expiration Date:   12/11/2018  . Comprehensive metabolic panel    Standing Status:   Future    Standing Expiration Date:   12/11/2018  . CBC with Differential/Platelet    Standing Status:   Future    Standing Expiration Date:   12/11/2018  . Iron and TIBC    Standing Status:   Future    Standing Expiration Date:   12/11/2018    INTERVAL HISTORY: Please see below for problem oriented charting. 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     06/29/2015 Imaging    MRI abdomen showed 1.9 cm enhancing lesion along the lateral left lower kidney, corresponding to the CT abnormality, suspicious for solid renal neoplasm such as renal cell carcinoma    07/05/2015 Procedure  EUS confirmed 2.6 cm mass opposite opening of the ampulla    07/05/2015 Pathology Results    Accession: [id number] FNA was positive for GIST    10/17/2015 Surgery    He underwent combined stomach resection and kidney resection    10/17/2015 Pathology Results    Accession: [id number] pathology from stomach revealed 3.5 cm GIST with low mitotic rate but positive margin. He also has papillary cell carcinoma involving the kidney    10/16/2016 Imaging    1. Status post  resection of a duodenum GIST. No findings for residual or recurrent tumor or metastatic disease. 2. Status post partial left nephrectomy for small renal cell carcinoma. No findings for recurrent tumor or metastatic disease. 3. Numerous bilateral renal cysts, some of which are hyperdense/hemorrhagic. No worrisome renal lesions. 4. Markedly enlarged prostate gland. 5. Small pericardial effusion and scattered mediastinal lymph nodes but no mass or overt adenopathy. 6. Benign-appearing hepatic cysts.    02/05/2017 Imaging    Ct scan of abdomen and pelvis showed no evidence of recurrent kidney cancer or GIST tumor. Probable small bowel enteritis    06/08/2017 Pathology Results    Duodenum, Biopsy - PEPTIC DUODENITIS - NO DYSPLASIA OR MALIGNANCY IDENTIFIED    06/08/2017 Procedure    There was evidence of a patent previous surgical anastomosis in the first portion of the duodenum. This was characterized by healthy appearing mucosa. Biopsies were taken with a cold forceps for histology. Verification of patient identification for the specimen was done. Estimated blood loss was minimal. Findings: A non-obstructing Schatzki ring was found at the gastroesophageal junction. A small hiatal hernia was present. The cardia and gastric fundus were normal on retroflexion. The exam was otherwise without abnormality. - Patent previous surgical anastomosis, characterized by healthy appearing mucosa was found in the duodenum. Biopsied. Was not photogtraphed - Non-obstructing Schatzki ring. - Small hiatal hernia. 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/17/2015     REVIEW OF SYSTEMS:   Constitutional: Denies fevers, chills or abnormal weight  loss Eyes: Denies blurriness of vision Ears, nose, mouth, throat, and face: Denies mucositis or sore throat Respiratory: Denies cough, dyspnea or wheezes Cardiovascular: Denies palpitation, chest discomfort or lower extremity swelling Gastrointestinal:  Denies nausea, heartburn or change in bowel habits Skin: Denies abnormal skin rashes Lymphatics: Denies new lymphadenopathy or easy bruising Neurological:Denies numbness, tingling or new weaknesses Behavioral/Psych: Mood is stable, no new changes  All other systems were reviewed with the patient and are negative.  I have reviewed the past medical history, past surgical history, social history and family history with the patient and they are unchanged from previous note.  ALLERGIES:  has No Known Allergies.  MEDICATIONS:  Current Outpatient Medications  Medication Sig Dispense Refill  . ARTIFICIAL TEAR OINTMENT OP Place 1 application into both eyes daily as needed (For dry eyes.).    Marland Kitchen aspirin EC 81 MG tablet Take 81 mg by mouth daily.    . dorzolamide (TRUSOPT) 2 % ophthalmic solution Place 1 drop into both eyes 2 (two) times daily.     . ferrous sulfate 325 (65 FE) MG tablet Take 1 tablet (325 mg total) by mouth daily. 90 tablet 3  . finasteride (PROSCAR) 5 MG tablet Take  5 mg by mouth every evening.   3  . lisinopril (PRINIVIL,ZESTRIL) 20 MG tablet Take 20 mg by mouth every morning.     . Multiple Vitamins-Minerals (MULTIVITAMINS THER. W/MINERALS) TABS tablet Take 1 tablet by mouth daily.    . Omega-3 Fatty Acids (FISH OIL PO) Take 4 capsules by mouth daily.    Marland Kitchen omeprazole (PRILOSEC) 20 MG capsule Take 20 mg by mouth daily.    . simvastatin (ZOCOR) 20 MG tablet Take 20 mg by mouth at bedtime.     . travoprost, benzalkonium, (TRAVATAN) 0.004 % ophthalmic solution Place 1 drop into both eyes at bedtime.      No current facility-administered medications for this visit.     PHYSICAL EXAMINATION: ECOG PERFORMANCE STATUS: 0 -  Asymptomatic  Vitals:   11/06/17 0850  BP: (!) 134/55  Pulse: 78  Resp: 18  Temp: 98 F (36.7 C)  SpO2: 100%   Filed Weights   11/06/17 0850  Weight: 220 lb (99.8 kg)    GENERAL:alert, no distress and comfortable SKIN: skin color, texture, turgor are normal, no rashes or significant lesions EYES: normal, Conjunctiva are pink and non-injected, sclera clear OROPHARYNX:no exudate, no erythema and lips, buccal mucosa, and tongue normal  NECK: supple, thyroid normal size, non-tender, without nodularity LYMPH:  no palpable lymphadenopathy in the cervical, axillary or inguinal LUNGS: clear to auscultation and percussion with normal breathing effort HEART: regular rate & rhythm and no murmurs and no lower extremity edema ABDOMEN:abdomen soft, non-tender and normal bowel sounds Musculoskeletal:no cyanosis of digits and no clubbing  NEURO: alert & oriented x 3 with fluent speech, no focal motor/sensory deficits  LABORATORY DATA:  I have reviewed the data as listed    Component Value Date/Time   NA 140 11/05/2017 0834   NA 140 10/16/2016 0816   K 5.0 11/05/2017 0834   K 4.1 10/16/2016 0816   CL 109 11/05/2017 0834   CO2 24 11/05/2017 0834   CO2 24 10/16/2016 0816   GLUCOSE 101 (H) 11/05/2017 0834   GLUCOSE 96 10/16/2016 0816   BUN 22 11/05/2017 0834   BUN 17.4 10/16/2016 0816   CREATININE 1.75 (H) 11/05/2017 0834   CREATININE 1.8 (H) 10/16/2016 0816   CALCIUM 9.4 11/05/2017 0834   CALCIUM 9.6 10/16/2016 0816   PROT 7.1 11/05/2017 0834   PROT 7.2 10/16/2016 0816   ALBUMIN 3.7 11/05/2017 0834   ALBUMIN 3.5 10/16/2016 0816   AST 20 11/05/2017 0834   AST 20 10/16/2016 0816   ALT 18 11/05/2017 0834   ALT 16 10/16/2016 0816   ALKPHOS 64 11/05/2017 0834   ALKPHOS 69 10/16/2016 0816   BILITOT 0.6 11/05/2017 0834   BILITOT 0.44 10/16/2016 0816   GFRNONAA 36 (L) 11/05/2017 0834   GFRAA 42 (L) 11/05/2017 0834    No results found for: SPEP, UPEP  Lab Results  Component  Value Date   WBC 4.6 11/05/2017   NEUTROABS 2.2 11/05/2017   HGB 11.8 (L) 11/05/2017   HCT 35.1 (L) 11/05/2017   MCV 99.7 (H) 11/05/2017   PLT 194 11/05/2017      Chemistry      Component Value Date/Time   NA 140 11/05/2017 0834   NA 140 10/16/2016 0816   K 5.0 11/05/2017 0834   K 4.1 10/16/2016 0816   CL 109 11/05/2017 0834   CO2 24 11/05/2017 0834   CO2 24 10/16/2016 0816   BUN 22 11/05/2017 0834   BUN 17.4 10/16/2016 0816   CREATININE  1.75 (H) 11/05/2017 0834   CREATININE 1.8 (H) 10/16/2016 0816      Component Value Date/Time   CALCIUM 9.4 11/05/2017 0834   CALCIUM 9.6 10/16/2016 0816   ALKPHOS 64 11/05/2017 0834   ALKPHOS 69 10/16/2016 0816   AST 20 11/05/2017 0834   AST 20 10/16/2016 0816   ALT 18 11/05/2017 0834   ALT 16 10/16/2016 0816   BILITOT 0.6 11/05/2017 0834   BILITOT 0.44 10/16/2016 0816       RADIOGRAPHIC STUDIES: I have personally reviewed the radiological images as listed and agreed with the findings in the report. Ct Abdomen Pelvis Wo Contrast  Result Date: 11/06/2017 CLINICAL DATA:  Renal cancer status post left partial nephrectomy. Soft tissue sarcoma (GIST), status post resection. EXAM: CT ABDOMEN AND PELVIS WITHOUT CONTRAST TECHNIQUE: Multidetector CT imaging of the abdomen and pelvis was performed following the standard protocol without IV contrast. COMPARISON:  02/05/2017 FINDINGS: Lower chest: Lung bases are clear. Small pericardial effusion. Hepatobiliary: 1.8 cm cyst inferiorly in the right hepatic lobe (series 2/image 16). Gallbladder is unremarkable. No intrahepatic or extrahepatic ductal dilatation. Pancreas: Within normal limits. Spleen: Within normal limits. Adrenals/Urinary Tract: Adrenal glands are within normal limits. Status post left partial nephrectomy. Multiple bilateral renal cysts, most of which measure simple fluid density, although hyperdense/hemorrhagic lesions are present bilaterally, measuring up to 2.2 cm in the lateral right  upper kidney (series 2/image 23). These lesions are technically indeterminate in the absence of intravenous contrast administration, but were better evaluated on prior studies. No renal calculi or hydronephrosis. Bladder is within normal limits. Stomach/Bowel: Stomach is within normal limits. No recurrent soft tissue mass in the ampullary region status post GIST resection. No evidence of bowel obstruction. Normal appendix (series 2/image 44). Mild sigmoid diverticulosis, without evidence of diverticulitis. Vascular/Lymphatic: No evidence of abdominal aortic aneurysm. Atherosclerotic calcifications of the abdominal aorta and branch vessels. No suspicious abdominopelvic lymphadenopathy. Reproductive: Prostatomegaly, with enlargement of the central gland, indenting the base of the bladder. Other: No abdominopelvic ascites. Musculoskeletal: Visualized osseous structures are within normal limits. IMPRESSION: Status post left partial nephrectomy. Multiple bilateral renal cysts, some of which are hyperdense/hemorrhagic, incompletely characterized on the current CT but better evaluated on prior enhanced study. No recurrent soft tissue mass in the ampullary region status post GIST resection. No findings suspicious for recurrent or metastatic disease. Prostatomegaly, suggesting BPH. Electronically Signed   By: Julian Hy M.D.   On: 11/06/2017 08:40    All questions were answered. The patient knows to call the clinic with any problems, questions or concerns. No barriers to learning was detected.  I spent 15 minutes counseling the patient face to face. The total time spent in the appointment was 20 minutes and more than 50% was on counseling and review of test results  [name], MD 11/06/2017 10:47 AM

## 2017-11-06 NOTE — Telephone Encounter (Signed)
Gave pt avs and calendar  °

## 2017-11-06 NOTE — Assessment & Plan Note (Signed)
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 His anemia is stable. He does not require any further workup or treatment now.

## 2017-11-06 NOTE — Assessment & Plan Note (Signed)
He has stable chronic kidney disease stage III since nephrectomy I will not order IV contrast with future CT imaging

## 2017-11-06 NOTE — Assessment & Plan Note (Signed)
Recent imaging studies show no evidence of recurrence He has no new anemia I would recommend once a year history, physical examination and CT imaging.

## 2017-11-06 NOTE — Assessment & Plan Note (Signed)
Surgical resection was complete. He does not require adjuvant treatment. He will continue close follow-up with urologist Recent CT imaging show no evidence of cancer recurrence.

## 2018-02-03 ENCOUNTER — Ambulatory Visit (INDEPENDENT_AMBULATORY_CARE_PROVIDER_SITE_OTHER): Payer: Medicare HMO | Admitting: Orthopaedic Surgery

## 2018-02-03 ENCOUNTER — Encounter (INDEPENDENT_AMBULATORY_CARE_PROVIDER_SITE_OTHER): Payer: Self-pay | Admitting: Orthopaedic Surgery

## 2018-02-03 VITALS — BP 113/69 | HR 68 | Ht 74.0 in | Wt 220.0 lb

## 2018-02-03 DIAGNOSIS — G8929 Other chronic pain: Secondary | ICD-10-CM

## 2018-02-03 DIAGNOSIS — M25511 Pain in right shoulder: Secondary | ICD-10-CM | POA: Diagnosis not present

## 2018-02-03 HISTORY — DX: Other chronic pain: G89.29

## 2018-02-03 NOTE — Progress Notes (Signed)
Office Visit Note   Patient: Mike Roach           Date of Birth: 05-18-1940           MRN: [id number] Visit Date: 02/03/2018              Requested by: Mia Creek, NP [place], [place] [address] PCP: Mia Creek, NP   Assessment & Plan: Visit Diagnoses:  1. Chronic right shoulder pain     Plan: Mr. Betzold has a diagnosis of rotator cuff tear right shoulder per prior MRI scan.  There was evidence of a full-thickness tear of the anterior attachment of the supraspinatus with 14 mm of retraction.  The infraspinatus and subscapularis tendons were intact.  The biceps long head we had probably ruptured.  There were some degenerative changes of the acromioclavicular joint and some mild changes of the glenohumeral joint.  I had suggested an arthroscopic SCD DCR and mini open rotator cuff tear repair but he want to give a little bit of time and try some physical therapy.  His biggest issue at this point is that he cannot lift over 20 pounds.  He is not having that much pain or any compromise of his sleep.  He was inquiring about rotator cuff tear surgery.  I was somewhat hesitant to suggest the surgery if he was not having any pain and the only issue is that he could not lift over 20 pounds.  Discussed at length over 30 minutes provement with the above surgery but that there was a possibility that he would not be any better.  He is not having any pain in but therefore, surgery would not be indicated for pain relief.  I want him to think about the surgery and let us know  Follow-Up Instructions: Return if symptoms worsen or fail to improve.   Orders:  No orders of the defined types were placed in this encounter.  No orders of the defined types were placed in this encounter.     Procedures: No procedures performed   Clinical Data: No additional findings.   Subjective: Chief Complaint  Patient presents with  . Right Shoulder - Pain, Follow-up  Patient returns  to discuss surgery in regards to right torn bicep. Had prior MRI scan with results as above.  He is working 2 jobs at age 77 and very active.  He does not have any significant pain but notes he is having difficulty lifting over 20 pounds.  He is had a full course of physical therapy.  He has no difficulty raising his arm over his head or sleeping.  He, apparently, does not have much compromise of his activities of daily living.  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 HPI  Review of Systems   Objective: Vital Signs: BP 113/69   Pulse 68   Ht 6\' 2"  (1.88 m)   Wt 220 lb (99.8 kg)   BMI 28.25 kg/m   Physical Exam  Constitutional: He is oriented to person, place, and time. He appears well-developed and well-nourished.  HENT:  Mouth/Throat: Oropharynx is clear and moist.  Eyes: Pupils are equal, round, and reactive to light. EOM are normal.  Pulmonary/Chest: Effort normal.  Neurological: He is alert and oriented to person, place, and time.  Skin: Skin is warm and dry.  Psychiatric: He has a normal mood and affect. His behavior is normal.    Ortho Exam awake alert and oriented x3.  Comfortable sitting.  Able to quickly place his right arm fully overhead.  Negative empty can testing and negative impingement testing.  Great strength with internal and external rotation.  Biceps tendon might be slightly distal in appearance.  No pain over the [facility] joint or the anterior subacromial region.  Motion of cervical spine.  Good grip and good release of low he does have some arthritic change of the PIP joint of his long finger which we have discussed  Specialty Comments:  No specialty comments available.  Imaging: No results found.   PMFS History: Patient Active Problem List   Diagnosis Date Noted  . Chronic right shoulder pain 02/03/2018  . 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  . Biceps tendon rupture, proximal, left, initial encounter 06/01/2017  . Deficiency anemia 10/17/2016  .  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 Hypertension   . GERD (gastroesophageal reflux disease)   . 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 Kidney lesion, native, left 07/12/2015  . Iron deficiency anemia 03/15/2015  . Bilateral hearing loss 03/15/2015  . Skin lesion of left leg 03/07/2015  . Anemia in chronic illness 03/07/2015  . Chronic kidney disease, stage III (moderate) ([place]) 03/07/2015  . Chronic kidney disease (CKD) 03/07/2015  . Adenomatous colon polyp 09/21/2014  . Lateral meniscus derangement 01/12/2013  . Osteoarthritis of right knee 01/12/2013   Past Medical History:  Diagnosis Date  . Adenomatous colon polyp 09/21/2014  . Anemia in chronic illness 03/07/2015   being evaluated  . Arthritis   . Bilateral hearing loss 03/15/2015  . Chronic kidney disease   . Chronic kidney disease (CKD) 03/07/2015  . GERD (gastroesophageal reflux disease)   . GIST (gastrointestinal stroma tumor), malignant, colon ([place])    Duodenom,   . Glaucoma    both eyes  . Hypercholesterolemia   . Hypertension   . Iron deficiency anemia 03/15/2015  . Prostate enlargement   . Skin lesion of left leg 03/07/2015    History reviewed. No pertinent family history.  Past Surgical History:  Procedure Laterality Date  . colonoscopy    . ESOPHAGOGASTRODUODENOSCOPY (EGD) WITH PROPOFOL N/A 06/08/2017   Procedure: ESOPHAGOGASTRODUODENOSCOPY (EGD) WITH PROPOFOL;  Surgeon: [name], MD;  Location: [facility];  Service: Endoscopy;  Laterality: N/A;  . EUS N/A 07/05/2015   Procedure: UPPER ENDOSCOPIC ULTRASOUND (EUS) LINEAR;  Surgeon: Milus Banister, MD;  Location: [facility];  Service: Endoscopy;  Laterality: N/A;  . KNEE ARTHROSCOPY WITH LATERAL MENISECTOMY Right 01/21/2013   Procedure: KNEE ARTHROSCOPY WITH PARTIAL LATERAL MENISECTOMY;  Surgeon: Carole Civil, MD;  Location: [facility];  Service: Orthopedics;  Laterality: Right;  . ORIF ANKLE DISLOCATION Right 15 yrs ago  . ROBOTIC ASSITED PARTIAL NEPHRECTOMY Left 10/17/2015   Procedure: XI ROBOTIC ASSITED LEFT PARTIAL NEPHRECTOMY, Left Cyst Decortication, Left Renal Ultrasound;  Surgeon: [name], MD;  Location: [facility];  Service: Urology;  Laterality: Left;   Social History   Occupational History  . Occupation: Security guard  Tobacco Use  . Smoking status: Former Smoker    Packs/day: 1.00    Years: 20.00    Pack years: 20.00    Types: Cigarettes    Last attempt to quit: 01/19/1981    Years since quitting: 37.0  . Smokeless tobacco: Never Used  Substance and Sexual Activity  . Alcohol use: No  . Drug use: No  .  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

## 2018-09-21 ENCOUNTER — Ambulatory Visit: Payer: Medicare HMO | Admitting: Urology

## 2018-09-21 DIAGNOSIS — R351 Nocturia: Secondary | ICD-10-CM | POA: Diagnosis not present

## 2018-09-21 DIAGNOSIS — N401 Enlarged prostate with lower urinary tract symptoms: Secondary | ICD-10-CM | POA: Diagnosis not present

## 2018-11-04 ENCOUNTER — Inpatient Hospital Stay: Payer: Medicare HMO | Attending: Hematology and Oncology

## 2018-11-04 ENCOUNTER — Other Ambulatory Visit: Payer: Self-pay

## 2018-11-04 ENCOUNTER — Ambulatory Visit (HOSPITAL_COMMUNITY)
Admission: RE | Admit: 2018-11-04 | Discharge: 2018-11-04 | Disposition: A | Discharge status: Home / Self Care | Payer: Medicare HMO | Source: Ambulatory Visit | Attending: Hematology and Oncology | Admitting: Hematology and Oncology

## 2018-11-04 DIAGNOSIS — Z8509 Personal history of malignant neoplasm of other digestive organs: Secondary | ICD-10-CM | POA: Diagnosis not present

## 2018-11-04 DIAGNOSIS — N183 Chronic kidney disease, stage 3 unspecified: Secondary | ICD-10-CM

## 2018-11-04 DIAGNOSIS — C49A3 Gastrointestinal stromal tumor of small intestine: Secondary | ICD-10-CM

## 2018-11-04 DIAGNOSIS — R079 Chest pain, unspecified: Secondary | ICD-10-CM | POA: Insufficient documentation

## 2018-11-04 DIAGNOSIS — D638 Anemia in other chronic diseases classified elsewhere: Secondary | ICD-10-CM | POA: Insufficient documentation

## 2018-11-04 DIAGNOSIS — Z85528 Personal history of other malignant neoplasm of kidney: Secondary | ICD-10-CM | POA: Diagnosis not present

## 2018-11-04 DIAGNOSIS — C642 Malignant neoplasm of left kidney, except renal pelvis: Secondary | ICD-10-CM

## 2018-11-04 LAB — CBC WITH DIFFERENTIAL/PLATELET
Abs Immature Granulocytes: 0.01 10*3/uL (ref 0.00–0.07)
Basophils Absolute: 0 10*3/uL (ref 0.0–0.1)
Basophils Relative: 1 %
Eosinophils Absolute: 0.2 10*3/uL (ref 0.0–0.5)
Eosinophils Relative: 5 %
HCT: 33.1 % — ABNORMAL LOW (ref 39.0–52.0)
Hemoglobin: 11.2 g/dL — ABNORMAL LOW (ref 13.0–17.0)
Immature Granulocytes: 0 %
Lymphocytes Relative: 32 %
Lymphs Abs: 1.3 10*3/uL (ref 0.7–4.0)
MCH: 33.1 pg (ref 26.0–34.0)
MCHC: 33.8 g/dL (ref 30.0–36.0)
MCV: 97.9 fL (ref 80.0–100.0)
Monocytes Absolute: 0.5 10*3/uL (ref 0.1–1.0)
Monocytes Relative: 12 %
Neutro Abs: 2 10*3/uL (ref 1.7–7.7)
Neutrophils Relative %: 50 %
Platelets: 247 10*3/uL (ref 150–400)
RBC: 3.38 MIL/uL — ABNORMAL LOW (ref 4.22–5.81)
RDW: 14.4 % (ref 11.5–15.5)
WBC: 4 10*3/uL (ref 4.0–10.5)
nRBC: 0 % (ref 0.0–0.2)

## 2018-11-04 LAB — COMPREHENSIVE METABOLIC PANEL
ALT: 16 U/L (ref 0–44)
AST: 19 U/L (ref 15–41)
Albumin: 3.7 g/dL (ref 3.5–5.0)
Alkaline Phosphatase: 71 U/L (ref 38–126)
Anion gap: 7 (ref 5–15)
BUN: 20 mg/dL (ref 8–23)
CO2: 23 mmol/L (ref 22–32)
Calcium: 9.3 mg/dL (ref 8.9–10.3)
Chloride: 110 mmol/L (ref 98–111)
Creatinine, Ser: 1.62 mg/dL — ABNORMAL HIGH (ref 0.61–1.24)
GFR calc Af Amer: 47 mL/min — ABNORMAL LOW (ref >60)
GFR calc non Af Amer: 40 mL/min — ABNORMAL LOW (ref >60)
Glucose, Bld: 91 mg/dL (ref 70–99)
Potassium: 4.5 mmol/L (ref 3.5–5.1)
Sodium: 140 mmol/L (ref 135–145)
Total Bilirubin: 0.4 mg/dL (ref 0.3–1.2)
Total Protein: 7.1 g/dL (ref 6.5–8.1)

## 2018-11-04 LAB — IRON AND TIBC
Iron: 58 ug/dL (ref 42–163)
Saturation Ratios: 23 % (ref 20–55)
TIBC: 254 ug/dL (ref 202–409)
UIBC: 196 ug/dL (ref 117–376)

## 2018-11-04 LAB — FERRITIN: Ferritin: 87 ng/mL (ref 24–336)

## 2018-11-05 ENCOUNTER — Inpatient Hospital Stay: Payer: Medicare HMO | Admitting: Hematology and Oncology

## 2018-11-05 ENCOUNTER — Encounter: Payer: Self-pay | Admitting: Hematology and Oncology

## 2018-11-05 ENCOUNTER — Telehealth: Payer: Self-pay | Admitting: Hematology and Oncology

## 2018-11-05 ENCOUNTER — Other Ambulatory Visit: Payer: Self-pay

## 2018-11-05 DIAGNOSIS — C642 Malignant neoplasm of left kidney, except renal pelvis: Secondary | ICD-10-CM

## 2018-11-05 DIAGNOSIS — R0789 Other chest pain: Secondary | ICD-10-CM

## 2018-11-05 DIAGNOSIS — C49A3 Gastrointestinal stromal tumor of small intestine: Secondary | ICD-10-CM

## 2018-11-05 DIAGNOSIS — D638 Anemia in other chronic diseases classified elsewhere: Secondary | ICD-10-CM | POA: Diagnosis not present

## 2018-11-05 DIAGNOSIS — N183 Chronic kidney disease, stage 3 unspecified: Secondary | ICD-10-CM

## 2018-11-05 NOTE — Assessment & Plan Note (Signed)
Recent imaging studies show no evidence of recurrence He has no new anemia I would recommend once a year history, physical examination and CT imaging. The patient had negative EGD evaluation in 2019

## 2018-11-05 NOTE — Assessment & Plan Note (Signed)
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 He has history of iron deficiency but repeat iron studies are adequate His anemia is stable. He does not require any further workup or treatment now. 

## 2018-11-05 NOTE — Assessment & Plan Note (Signed)
Surgical resection was complete. He does not require adjuvant treatment. He will continue close follow-up with urologist Recent CT imaging show no evidence of cancer recurrence.

## 2018-11-05 NOTE — Telephone Encounter (Signed)
I talk with patient regarding schedule  

## 2018-11-05 NOTE — Assessment & Plan Note (Signed)
He has occasional palpitation and chest discomfort on exertion I recommend follow-up with primary care doctor for cardiovascular risk assessment and follow-up

## 2018-11-05 NOTE — Assessment & Plan Note (Signed)
He has stable chronic kidney disease stage III since nephrectomy I will not order IV contrast with future CT imaging

## 2018-11-05 NOTE — Progress Notes (Signed)
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 He does not require adjuvant treatment. He will continue close follow-up with urologist Recent CT imaging show no evidence of cancer recurrence.  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 He has history of iron deficiency but repeat iron studies are adequate His anemia is stable. He does not require any further workup or treatment now.  Chest discomfort He has occasional palpitation and chest discomfort on exertion I recommend follow-up with primary care doctor for cardiovascular risk assessment and follow-up   Orders Placed This Encounter  Procedures  . CT Abdomen Pelvis W Wo Contrast    This exam should ONLY be ordered for initial diagnosis or follow up of known pancreatic/liver/renal/bladder masses.    Standing Status:   Future    Standing Expiration Date:   02/04/2020    Order Specific Question:   If indicated for the ordered procedure, I authorize the  administration of contrast media per Radiology protocol    Answer:   Yes    Order Specific Question:   Preferred imaging location?    Answer:   [facility]    Order Specific Question:   Is Oral Contrast requested for this exam?    Answer:   Yes, Per Radiology protocol    Order Specific Question:   Radiology Contrast Protocol - do NOT remove file path    Answer:   \\[email or web address].[email or web address]  . Comprehensive metabolic panel    Standing Status:   Future    Standing Expiration Date:   12/10/2019  . CBC with Differential/Platelet    Standing Status:   Future    Standing Expiration Date:   12/10/2019  . Ferritin    Standing Status:   Future    Standing Expiration Date:   11/05/2019  . Iron and TIBC    Standing Status:   Future    Standing Expiration Date:   12/10/2019    INTERVAL HISTORY: Please see below for problem oriented charting. 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    06/29/2015 Imaging   MRI abdomen showed 1.9 cm enhancing lesion along the lateral left lower kidney, corresponding to the CT abnormality, suspicious for solid renal neoplasm such as renal cell carcinoma    07/05/2015 Procedure   EUS confirmed 2.6 cm mass opposite opening of the ampulla   07/05/2015 Pathology Results   Accession: [id number] FNA was positive for GIST   10/17/2015 Surgery   He underwent combined stomach resection and kidney resection   10/17/2015 Pathology Results   Accession: [id number] pathology from stomach revealed 3.5 cm GIST with low mitotic rate but positive margin. He also has papillary cell carcinoma involving the kidney   10/16/2016 Imaging   1. Status post resection of a duodenum GIST. No findings for residual or recurrent tumor or metastatic disease. 2. Status post partial left nephrectomy for small renal cell carcinoma. No findings for recurrent tumor or metastatic disease. 3. Numerous bilateral renal cysts, some of which are hyperdense/hemorrhagic. No worrisome renal lesions. 4. Markedly enlarged prostate gland. 5. Small pericardial effusion and scattered mediastinal lymph nodes but no mass or overt adenopathy. 6. Benign-appearing hepatic cysts.   02/05/2017 Imaging   Ct scan of abdomen and pelvis showed no evidence of recurrent kidney cancer or GIST tumor. Probable small bowel enteritis   06/08/2017 Pathology Results   Duodenum, Biopsy - PEPTIC DUODENITIS - NO DYSPLASIA OR MALIGNANCY IDENTIFIED   06/08/2017 Procedure   There was evidence of a patent previous surgical anastomosis in the first portion of the duodenum. This was characterized by healthy appearing mucosa. Biopsies were taken with a cold forceps for histology. Verification of patient identification for the specimen was done. Estimated blood loss was minimal. Findings: A non-obstructing Schatzki ring was found at the gastroesophageal junction. A small hiatal hernia was present. The cardia and gastric fundus were normal on retroflexion. The exam was otherwise without abnormality. - Patent previous surgical anastomosis, characterized by healthy appearing mucosa was found in the duodenum. Biopsied. Was not  photogtraphed - Non-obstructing Schatzki ring. - Small hiatal hernia. - The examination was otherwise   11/04/2018 Imaging   1. No evidence of recurrent tumor in the periampullary duodenum or in the partial nephrectomy bed in the lower left kidney on this noncontrast scan. 2. No findings of metastatic disease in the abdomen or pelvis on this noncontrast scan. 3. Simple and hyperdense renal cortical lesions in both kidneys, not substantially changed, previously characterized as Bosniak category 1 and 2 renal cysts on MRI. 4. Marked prostatomegaly. 5.  Aortic Atherosclerosis (ICD10-I70.0).     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/17/2015     REVIEW OF SYSTEMS:   Constitutional: Denies fevers, chills or abnormal weight loss Eyes: Denies blurriness of vision Ears, nose, mouth, throat, and face: Denies mucositis or sore throat Respiratory: Denies cough, dyspnea or wheezes Cardiovascular: Denies palpitation, chest discomfort or lower extremity swelling Gastrointestinal:  Denies nausea, heartburn or change in bowel habits Skin: Denies abnormal skin rashes Lymphatics: Denies new lymphadenopathy or easy bruising Neurological:Denies numbness, tingling or new weaknesses Behavioral/Psych: Mood is stable, no new changes  All other systems were reviewed with the patient and are negative.  I  have reviewed the past medical history, past surgical history, social history and family history with the patient and they are unchanged from previous note.  ALLERGIES:  has No Known Allergies.  MEDICATIONS:  Current Outpatient Medications  Medication Sig Dispense Refill  . ARTIFICIAL TEAR OINTMENT OP Place 1 application into both eyes daily as needed (For dry eyes.).    Marland Kitchen aspirin EC  81 MG tablet Take 81 mg by mouth daily.    . dorzolamide (TRUSOPT) 2 % ophthalmic solution Place 1 drop into both eyes 2 (two) times daily.     . ferrous sulfate 325 (65 FE) MG tablet Take 1 tablet (325 mg total) by mouth daily. 90 tablet 3  . finasteride (PROSCAR) 5 MG tablet Take 5 mg by mouth every evening.   3  . lisinopril (PRINIVIL,ZESTRIL) 20 MG tablet Take 20 mg by mouth every morning.     . Multiple Vitamins-Minerals (MULTIVITAMINS THER. W/MINERALS) TABS tablet Take 1 tablet by mouth daily.    . Omega-3 Fatty Acids (FISH OIL PO) Take 4 capsules by mouth daily.    Marland Kitchen omeprazole (PRILOSEC) 20 MG capsule Take 20 mg by mouth daily.    . simvastatin (ZOCOR) 20 MG tablet Take 20 mg by mouth at bedtime.     . travoprost, benzalkonium, (TRAVATAN) 0.004 % ophthalmic solution Place 1 drop into both eyes at bedtime.      No current facility-administered medications for this visit.     PHYSICAL EXAMINATION: ECOG PERFORMANCE STATUS: 1 - Symptomatic but completely ambulatory  Vitals:   11/05/18 0815  BP: (!) 129/55  Pulse: 63  Resp: 18  Temp: 98.3 F (36.8 C)  SpO2: 100%   Filed Weights   11/05/18 0815  Weight: 213 lb 6.4 oz (96.8 kg)    GENERAL:alert, no distress and comfortable SKIN: skin color, texture, turgor are normal, no rashes or significant lesions EYES: normal, Conjunctiva are pink and non-injected, sclera clear OROPHARYNX:no exudate, no erythema and lips, buccal mucosa, and tongue normal  NECK: supple, thyroid normal size, non-tender, without nodularity LYMPH:  no palpable lymphadenopathy in the cervical, axillary or inguinal LUNGS: clear to auscultation and percussion with normal breathing effort HEART: regular rate & rhythm and no murmurs and no lower extremity edema ABDOMEN:abdomen soft, non-tender and normal bowel sounds Musculoskeletal:no cyanosis of digits and no clubbing  NEURO: alert & oriented x 3 with fluent speech, no focal motor/sensory deficits  LABORATORY  DATA:  I have reviewed the data as listed    Component Value Date/Time   NA 140 11/04/2018 0815   NA 140 10/16/2016 0816   K 4.5 11/04/2018 0815   K 4.1 10/16/2016 0816   CL 110 11/04/2018 0815   CO2 23 11/04/2018 0815   CO2 24 10/16/2016 0816   GLUCOSE 91 11/04/2018 0815   GLUCOSE 96 10/16/2016 0816   BUN 20 11/04/2018 0815   BUN 17.4 10/16/2016 0816   CREATININE 1.62 (H) 11/04/2018 0815   CREATININE 1.8 (H) 10/16/2016 0816   CALCIUM 9.3 11/04/2018 0815   CALCIUM 9.6 10/16/2016 0816   PROT 7.1 11/04/2018 0815   PROT 7.2 10/16/2016 0816   ALBUMIN 3.7 11/04/2018 0815   ALBUMIN 3.5 10/16/2016 0816   AST 19 11/04/2018 0815   AST 20 10/16/2016 0816   ALT 16 11/04/2018 0815   ALT 16 10/16/2016 0816   ALKPHOS 71 11/04/2018 0815   ALKPHOS 69 10/16/2016 0816   BILITOT 0.4 11/04/2018 0815   BILITOT 0.44 10/16/2016 0816   GFRNONAA  40 (L) 11/04/2018 0815   GFRAA 47 (L) 11/04/2018 0815    No results found for: SPEP, UPEP  Lab Results  Component Value Date   WBC 4.0 11/04/2018   NEUTROABS 2.0 11/04/2018   HGB 11.2 (L) 11/04/2018   HCT 33.1 (L) 11/04/2018   MCV 97.9 11/04/2018   PLT 247 11/04/2018      Chemistry      Component Value Date/Time   NA 140 11/04/2018 0815   NA 140 10/16/2016 0816   K 4.5 11/04/2018 0815   K 4.1 10/16/2016 0816   CL 110 11/04/2018 0815   CO2 23 11/04/2018 0815   CO2 24 10/16/2016 0816   BUN 20 11/04/2018 0815   BUN 17.4 10/16/2016 0816   CREATININE 1.62 (H) 11/04/2018 0815   CREATININE 1.8 (H) 10/16/2016 0816      Component Value Date/Time   CALCIUM 9.3 11/04/2018 0815   CALCIUM 9.6 10/16/2016 0816   ALKPHOS 71 11/04/2018 0815   ALKPHOS 69 10/16/2016 0816   AST 19 11/04/2018 0815   AST 20 10/16/2016 0816   ALT 16 11/04/2018 0815   ALT 16 10/16/2016 0816   BILITOT 0.4 11/04/2018 0815   BILITOT 0.44 10/16/2016 0816       RADIOGRAPHIC STUDIES: I have reviewed CT imaging with the patient I have personally reviewed the  radiological images as listed and agreed with the findings in the report. Ct Abdomen Pelvis Wo Contrast  Result Date: 11/04/2018 CLINICAL DATA:  Status post resection of duodenal GIST and left papillary renal cell carcinoma 10/17/2015. Restaging. EXAM: CT ABDOMEN AND PELVIS WITHOUT CONTRAST TECHNIQUE: Multidetector CT imaging of the abdomen and pelvis was performed following the standard protocol without IV contrast. COMPARISON:  11/05/2017 CT abdomen/pelvis. FINDINGS: Lower chest: No acute abnormality at the lung bases. Chronic small pericardial effusion is unchanged. Hepatobiliary: Normal liver size. Simple 1.9 cm peripheral right liver cyst. Subcentimeter hypodense posterior right liver lesion is too small to characterize and not appreciably changed, considered benign. No new liver lesions. Normal gallbladder with no radiopaque cholelithiasis. No biliary ductal dilatation. Pancreas: Normal, with no mass or duct dilation. Spleen: Normal size. No mass. Adrenals/Urinary Tract: Normal adrenals. No renal stones. No hydronephrosis. Status post partial lower left nephrectomy. No evidence of a recurrent mass in nephrectomy bed. There are a few hyperdense renal cortical lesions in the kidneys bilaterally, largest 2.0 cm in the lateral upper right kidney (series 2/image 19), not substantially changed, previously characterized as a Bosniak category 2 hemorrhagic/proteinaceous renal cysts on MRI. Numerous simple renal cysts in both kidneys, largest 3.9 cm in the upper left kidney. Normal bladder. Stomach/Bowel: Small hiatal hernia. Otherwise normal nondistended stomach. No evidence of a discrete recurrent mass in the periampullary duodenum. Normal caliber small bowel with no small bowel wall thickening. Normal appendix. Oral contrast transits to the left colon. Moderate sigmoid diverticulosis, with no large bowel wall thickening or significant pericolonic fat stranding. Vascular/Lymphatic: Atherosclerotic nonaneurysmal  abdominal aorta. No pathologically enlarged lymph nodes in the abdomen or pelvis. Reproductive: Marked prostatomegaly. Other: No pneumoperitoneum, ascites or focal fluid collection. Musculoskeletal: No aggressive appearing focal osseous lesions. Mild thoracolumbar spondylosis. IMPRESSION: 1. No evidence of recurrent tumor in the periampullary duodenum or in the partial nephrectomy bed in the lower left kidney on this noncontrast scan. 2. No findings of metastatic disease in the abdomen or pelvis on this noncontrast scan. 3. Simple and hyperdense renal cortical lesions in both kidneys, not substantially changed, previously characterized as Bosniak category 1 and  2 renal cysts on MRI. 4. Marked prostatomegaly. 5.  Aortic Atherosclerosis (ICD10-I70.0). Electronically Signed   By: Ilona Sorrel M.D.   On: 11/04/2018 10:27    All questions were answered. The patient knows to call the clinic with any problems, questions or concerns. No barriers to learning was detected.  I spent 15 minutes counseling the patient face to face. The total time spent in the appointment was 20 minutes and more than 50% was on counseling and review of test results  [name], MD 11/05/2018 8:55 AM

## 2018-12-03 ENCOUNTER — Telehealth: Payer: Self-pay

## 2018-12-03 NOTE — Telephone Encounter (Signed)
NOTES ON FILE FROM  [facility] AT [phone], REFERRAL SENT TO [facility]

## 2019-01-03 ENCOUNTER — Telehealth: Payer: Self-pay | Admitting: Cardiovascular Disease

## 2019-01-03 NOTE — Telephone Encounter (Signed)
Left detailed message asking patient to call back to explain reason he will need someone to accompany him to his appointment

## 2019-01-03 NOTE — Telephone Encounter (Signed)
New message:      Pt wants you to know that his ex wife will be coming with him on Wednesday(01-05-19) for his appt with Dr Acie Fredrickson.

## 2019-01-04 NOTE — Telephone Encounter (Signed)
Follow Up:    Pt says he needs somebody to come with him for his appt, because he can not hear that well and a lot time do not understand what is said to him.

## 2019-01-04 NOTE — Telephone Encounter (Signed)
I spoke to the patient who said that his ex-wife will need to accompany him to the appointment due to his [facility] and trouble communicating.

## 2019-01-05 ENCOUNTER — Other Ambulatory Visit: Payer: Self-pay

## 2019-01-05 ENCOUNTER — Ambulatory Visit: Payer: Medicare HMO | Admitting: Cardiovascular Disease

## 2019-01-05 ENCOUNTER — Encounter: Payer: Self-pay | Admitting: Cardiovascular Disease

## 2019-01-05 VITALS — BP 96/58 | HR 60 | Ht 74.5 in | Wt 215.4 lb

## 2019-01-05 DIAGNOSIS — I700 Atherosclerosis of aorta: Secondary | ICD-10-CM | POA: Diagnosis not present

## 2019-01-05 DIAGNOSIS — I10 Essential (primary) hypertension: Secondary | ICD-10-CM | POA: Diagnosis not present

## 2019-01-05 DIAGNOSIS — I313 Pericardial effusion (noninflammatory): Secondary | ICD-10-CM

## 2019-01-05 DIAGNOSIS — I3139 Other pericardial effusion (noninflammatory): Secondary | ICD-10-CM

## 2019-01-05 DIAGNOSIS — R079 Chest pain, unspecified: Secondary | ICD-10-CM

## 2019-01-05 MED ORDER — METOPROLOL TARTRATE 50 MG PO TABS: ORAL_TABLET | ORAL | 0 refills | Status: DC

## 2019-01-05 NOTE — Patient Instructions (Addendum)
Medication Instructions:  Your physician recommends that you continue on your current medications as directed. Please refer to the Current Medication list given to you today.  *If you need a refill on your cardiac medications before your next appointment, please call your pharmacy*  Lab Work: Your physician recommends that you return for lab work in: 1 week before your CT. When your CT is scheduled, you will make a lab appointment to be completed here at our office a few days prior. You do not have to fast.   Follow-Up: At [facility], you and your health needs are our priority.  As part of our continuing mission to provide you with exceptional heart care, we have created designated Provider Care Teams.  These Care Teams include your primary Cardiologist (physician) and Advanced Practice Providers (APPs -  Physician Assistants and Nurse Practitioners) who all work together to provide you with the care you need, when you need it.  Your next appointment:   3 months  The format for your next appointment:   In Person  Provider:   You may see Mertie Moores, MD or one of the following Advanced Practice Providers on your designated Care Team:    Richardson Dopp, PA-C  [place], [place]  Daune Perch, NP   Testing/Procedures: Your physician has requested that you have an echocardiogram. Echocardiography is a painless test that uses sound waves to create images of your heart. It provides your doctor with information about the size and shape of your heart and how well your heart's chambers and valves are working. This procedure takes approximately one hour. There are no restrictions for this procedure.  Your cardiac CT will be scheduled at one of the below locations:   [facility] [address] [place], [place] [address] (336) [place] [address] [place], [place] [address] [phone]  If scheduled at  [facility], please arrive at the [facility] main entrance of [facility] 30-45 minutes prior to test start time. Proceed to the [facility] Radiology Department (first floor) to check-in and test prep.  If scheduled at [facility], please arrive 15 mins early for check-in and test prep.  Please follow these instructions carefully (unless otherwise directed):  Hold all erectile dysfunction medications at least 3 days (72 hrs) prior to test.  On the Night Before the Test: . Be sure to Drink plenty of water. . Do not consume any caffeinated/decaffeinated beverages or chocolate 12 hours prior to your test. . Do not take any antihistamines 12 hours prior to your test.   On the Day of the Test: . Drink plenty of water. Do not drink any water within one hour of the test. . Do not eat any food 4 hours prior to the test. . You may take your regular medications prior to the test.  . Take metoprolol (Lopressor) two hours prior to test. . HOLD Furosemide/Hydrochlorothiazide morning of the test.       After the Test: . Drink plenty of water. . After receiving IV contrast, you may experience a mild flushed feeling. This is normal. . On occasion, you may experience a mild rash up to 24 hours after the test. This is not dangerous. If this occurs, you can take Benadryl 25 mg and increase your fluid intake. . If you experience trouble breathing, this can be serious. If it is severe call 911 IMMEDIATELY. If it is mild,  please call our office. . If you take any of these medications: Glipizide/Metformin, Avandament, Glucavance, please do not take 48 hours after completing test unless otherwise instructed.   Once we have confirmed authorization from your insurance company, we will call you to set up a date and time for your test.   For non-scheduling related questions, please contact the cardiac imaging nurse navigator should you have any  questions/concerns: Marchia Bond, RN Navigator Cardiac Imaging [facility] [phone] Office

## 2019-01-05 NOTE — Progress Notes (Signed)
Cardiology Office Note:    Date:  01/05/2019   ID:  Mike Roach, DOB February 17, 1941, MRN [id number]  PCP:  Sandi Mealy, MD  Cardiologist:  Kritika Stukes  Electrophysiologist:  None   Referring MD: Sandi Mealy, MD   Chief Complaint  Patient presents with   Coronary Artery Disease   Pericardial Effusion    History of Present Illness:    Mike Roach is a 78 y.o. 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 Lorra Hals .   The patient has CT scans on a regular basis for his gastrointestinal cancer as well as his renal cell cancer.  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  He also has been noted to have some atherosclerosis in his aorta.  Has some chest tightness  with walking  May last for a minute   Walks some at work.   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  Busy waking much of the day .   Cholesterol levels from the [facility] medical [facility] from March 15, 2018 reveal a total cholesterol of 169.  The triglyceride level is 126.  HDL is 46.  LDL is 98.  Past Medical History:  Diagnosis Date   Adenomatous colon polyp 09/21/2014   Anemia in chronic illness 03/07/2015   being evaluated   Arthritis    Atrial flutter (HCC)    Bilateral hearing loss 03/15/2015   Cancer of intestine (HCC)    Chronic kidney disease (CKD) 03/07/2015   Chronic kidney disease, stage III (moderate) 03/07/2015   Chronic right shoulder pain 02/03/2018   GERD (gastroesophageal reflux disease)    GIST (gastrointestinal stroma tumor), malignant, colon ([place])    Duodenom,    Glaucoma    both eyes   Hearing impairment    Hypercholesterolemia    Hypertension    Iron deficiency anemia 03/15/2015    Kidney lesion, native, left 07/12/2015   Lateral meniscus derangement 01/12/2013   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   Osteoarthritis of right knee 01/12/2013   Prostate enlargement    Skin lesion of left leg 03/07/2015    Past Surgical History:  Procedure Laterality Date   colonoscopy     ESOPHAGOGASTRODUODENOSCOPY (EGD) WITH PROPOFOL N/A 06/08/2017   Procedure: ESOPHAGOGASTRODUODENOSCOPY (EGD) WITH PROPOFOL;  Surgeon: [name], MD;  Location: [facility];  Service: Endoscopy;  Laterality: N/A;   EUS N/A 07/05/2015   Procedure: UPPER ENDOSCOPIC ULTRASOUND (EUS) LINEAR;  Surgeon: Milus Banister, MD;  Location: [facility];  Service: Endoscopy;  Laterality: N/A;   KNEE ARTHROSCOPY WITH LATERAL MENISECTOMY Right 01/21/2013   Procedure: KNEE ARTHROSCOPY WITH PARTIAL LATERAL MENISECTOMY;  Surgeon: Carole Civil, MD;  Location: [facility];  Service: Orthopedics;  Laterality: Right;   ORIF ANKLE DISLOCATION Right 15 yrs ago   ROBOTIC ASSITED PARTIAL NEPHRECTOMY Left 10/17/2015   Procedure: XI ROBOTIC ASSITED LEFT PARTIAL NEPHRECTOMY, Left Cyst Decortication, Left Renal Ultrasound;  Surgeon: [name], MD;  Location: [facility];  Service: Urology;  Laterality: Left;    Current Medications: Current Meds  Medication Sig   ARTIFICIAL TEAR OINTMENT OP Place 1 application into both eyes daily as needed (For dry eyes.).   aspirin EC 81 MG tablet Take 81 mg  by mouth daily.   b complex vitamins capsule Take 1 capsule by mouth daily.   dorzolamide (TRUSOPT) 2 % ophthalmic solution Place 1 drop into both eyes 2 (two) times daily.    ferrous sulfate 325 (65 FE) MG tablet Take 1 tablet (325 mg total) by mouth daily.   finasteride (PROSCAR) 5 MG tablet Take 5 mg by mouth every evening.    hydrocortisone (ANUSOL-HC) 25 MG suppository Place 25 mg rectally daily as needed for hemorrhoids or anal itching.   hydrocortisone 2.5 % cream Apply topically 2 (two) times daily.    indomethacin (INDOCIN) 25 MG capsule Take 25 mg by mouth 2 (two) times daily with a meal.   lanolin ointment Apply topically as needed (apply to eye).   latanoprost (XALATAN) 0.005 % ophthalmic solution Place 1 drop into both eyes at bedtime.   lisinopril (PRINIVIL,ZESTRIL) 20 MG tablet Take 20 mg by mouth every morning.    Multiple Vitamins-Minerals (MULTIVITAMINS THER. W/MINERALS) TABS tablet Take 1 tablet by mouth daily.   Omega-3 Fatty Acids (FISH OIL PO) Take 4 capsules by mouth daily.   omeprazole (PRILOSEC) 20 MG capsule Take 20 mg by mouth daily.   simvastatin (ZOCOR) 20 MG tablet Take 20 mg by mouth at bedtime.    travoprost, benzalkonium, (TRAVATAN) 0.004 % ophthalmic solution Place 1 drop into both eyes at bedtime.    triamcinolone cream (KENALOG) 0.1 % Apply 1 application topically 2 (two) times daily.     Allergies:   Patient has no known allergies.   Social History   Socioeconomic History   Marital status: Divorced    Spouse name: Not on file   Number of children: Not on file   Years of education: Not on file   Highest education level: Not on file  Occupational History   Occupation: Security guard  Social Needs   Financial resource strain: Not on file   Food insecurity    Worry: Not on file    Inability: Not on file   Transportation needs    Medical: Not on file    Non-medical: Not on file  Tobacco Use   Smoking status: Former Smoker    Packs/day: 1.00    Years: 20.00    Pack years: 20.00    Types: Cigarettes    Quit date: 01/19/1981    Years since quitting: 37.9   Smokeless tobacco: Never Used  Substance and Sexual Activity   Alcohol use: No   Drug use: No   Sexual activity: Yes    Birth control/protection: None    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, prior Event organiser  Lifestyle   Physical activity    Days per week: Not on file    Minutes per session: Not on file   Stress: Not on file  Relationships   Social  connections    Talks on phone: Not on file    Gets together: Not on file    Attends religious service: Not on file    Active member of club or organization: Not on file    Attends meetings of clubs or organizations: Not on file    Relationship status: Not on file  Other Topics Concern   Not on file  Social History Narrative   Divorced   Employed as a Land guard   Former smoker but no current tobacco no alcohol or drug use     Family History: The patient's family history includes Hypertension in his father.  ROS:  Please see the history of present illness.     All other systems reviewed and are negative.  EKGs/Labs/Other Studies Reviewed:    The following studies were reviewed today:     Recent Labs: 11/04/2018: ALT 16; BUN 20; Creatinine, Ser 1.62; Hemoglobin 11.2; Platelets 247; Potassium 4.5; Sodium 140  Recent Lipid Panel No results found for: CHOL, TRIG, HDL, CHOLHDL, VLDL, LDLCALC, LDLDIRECT  Physical Exam:    VS:  BP (!) 96/58    Pulse 60    Ht 6' 2.5" (1.892 m)    Wt 215 lb 6.4 oz (97.7 kg)    SpO2 99%    BMI 27.29 kg/m     Wt Readings from Last 3 Encounters:  01/05/19 215 lb 6.4 oz (97.7 kg)  11/05/18 213 lb 6.4 oz (96.8 kg)  02/03/18 220 lb (99.8 kg)     GEN: Chronically ill-appearing, elderly gentleman HEENT: Normal NECK: No JVD; No carotid bruits LYMPHATICS: No lymphadenopathy CARDIAC: RRR, no murmurs, rubs, gallops RESPIRATORY:  Clear to auscultation without rales, wheezing or rhonchi  ABDOMEN: Soft, non-tender, non-distended MUSCULOSKELETAL:  No edema; No deformity  SKIN: Warm and dry NEUROLOGIC:  Alert and oriented x 3 PSYCHIATRIC:  Normal affect   EKG:  EKG January 05, 2019: Normal sinus rhythm at 60 beats a minute.  Voltage criteria for LVH.  ASSESSMENT:    1. Idiopathic pericardial effusion   2. Chest pain, unspecified type   3. Atherosclerosis of aorta (HCC)    PLAN:    In order of problems listed above:  1.  Chronic  pericardial effusion.  Patient has had multiple CT scans because of his history of cancer.  He had a chronic small pericardial effusion documented for years.  The effusion is unchanged.  His primary medical doctor wanted him seen for further evaluation of this.  We will get an echocardiogram for further evaluation.  He was started on some indomethacin to see if that would help with the pericardial effusion.  He has chronic kidney disease disease so I be very careful in giving indomethacin.  I suspect that this chronic pericardial effusion is very stable and is not causing him any trouble.  2.  He was incidentally noted to have atherosclerosis on his aorta on the past aortic atherosclerosis.  Several CT scans as well.  He does describe some chest tightness which is worrisome.  He also has a history of hyperlipidemia.  We will schedule him for a coronary CT angiogram to evaluate his coronary arteries.  I will see him again in 3 months for follow-up visit.   Medication Adjustments/Labs and Tests Ordered: Current medicines are reviewed at length with the patient today.  Concerns regarding medicines are outlined above.  Orders Placed This Encounter  Procedures   CT CORONARY MORPH W/CTA COR W/SCORE W/CA W/CM &/OR WO/CM   CT CORONARY FRACTIONAL FLOW RESERVE DATA PREP   CT CORONARY FRACTIONAL FLOW RESERVE FLUID ANALYSIS   Basic Metabolic Panel (BMET)   EKG 12-Lead   ECHOCARDIOGRAM COMPLETE   Meds ordered this encounter  Medications   metoprolol tartrate (LOPRESSOR) 50 MG tablet    Sig: Take 1 pill (50 mg) 2 hours before your coronary CT    Dispense:  1 tablet    Refill:  0    Patient Instructions  Medication Instructions:  Your physician recommends that you continue on your current medications as directed. Please refer to the Current Medication list given to you today.  *If you need a refill on  your cardiac medications before your next appointment, please call your pharmacy*  Lab  Work: Your physician recommends that you return for lab work in: 1 week before your CT. When your CT is scheduled, you will make a lab appointment to be completed here at our office a few days prior. You do not have to fast.   Follow-Up: At [facility], you and your health needs are our priority.  As part of our continuing mission to provide you with exceptional heart care, we have created designated Provider Care Teams.  These Care Teams include your primary Cardiologist (physician) and Advanced Practice Providers (APPs -  Physician Assistants and Nurse Practitioners) who all work together to provide you with the care you need, when you need it.  Your next appointment:   3 months  The format for your next appointment:   In Person  Provider:   You may see Mertie Moores, MD or one of the following Advanced Practice Providers on your designated Care Team:    Richardson Dopp, PA-C  [place], [place]  Daune Perch, NP   Testing/Procedures: Your physician has requested that you have an echocardiogram. Echocardiography is a painless test that uses sound waves to create images of your heart. It provides your doctor with information about the size and shape of your heart and how well your hearts chambers and valves are working. This procedure takes approximately one hour. There are no restrictions for this procedure.  Your cardiac CT will be scheduled at one of the below locations:   [facility] [address] [place], [place] [address] (336) [place] [address] [place], [place] [address] [phone]  If scheduled at [facility], please arrive at the [facility] main entrance of [facility] 30-45 minutes prior to test start time. Proceed to the [facility] Radiology Department (first floor) to check-in and test prep.  If scheduled at [facility], please arrive  15 mins early for check-in and test prep.  Please follow these instructions carefully (unless otherwise directed):  Hold all erectile dysfunction medications at least 3 days (72 hrs) prior to test.  On the Night Before the Test:  Be sure to Drink plenty of water.  Do not consume any caffeinated/decaffeinated beverages or chocolate 12 hours prior to your test.  Do not take any antihistamines 12 hours prior to your test.   On the Day of the Test:  Drink plenty of water. Do not drink any water within one hour of the test.  Do not eat any food 4 hours prior to the test.  You may take your regular medications prior to the test.   Take metoprolol (Lopressor) two hours prior to test.  HOLD Furosemide/Hydrochlorothiazide morning of the test.       After the Test:  Drink plenty of water.  After receiving IV contrast, you may experience a mild flushed feeling. This is normal.  On occasion, you may experience a mild rash up to 24 hours after the test. This is not dangerous. If this occurs, you can take Benadryl 25 mg and increase your fluid intake.  If you experience trouble breathing, this can be serious. If it is severe call 911 IMMEDIATELY. If it is mild, please call our office.  If you take any of these medications: Glipizide/Metformin, Avandament, Glucavance, please do not take 48 hours after completing test unless otherwise instructed.   Once we have  confirmed authorization from your insurance company, we will call you to set up a date and time for your test.   For non-scheduling related questions, please contact the cardiac imaging nurse navigator should you have any questions/concerns: Marchia Bond, RN Navigator Cardiac Imaging [facility] [phone] Office        Signed, Mertie Moores, MD  01/05/2019 6:06 PM    [place]

## 2019-01-12 ENCOUNTER — Ambulatory Visit (HOSPITAL_COMMUNITY): Payer: Medicare HMO | Attending: Cardiovascular Disease

## 2019-01-12 ENCOUNTER — Other Ambulatory Visit: Payer: Self-pay

## 2019-01-12 DIAGNOSIS — I700 Atherosclerosis of aorta: Secondary | ICD-10-CM | POA: Diagnosis present

## 2019-01-12 DIAGNOSIS — R079 Chest pain, unspecified: Secondary | ICD-10-CM | POA: Insufficient documentation

## 2019-01-12 DIAGNOSIS — I313 Pericardial effusion (noninflammatory): Secondary | ICD-10-CM | POA: Insufficient documentation

## 2019-01-12 DIAGNOSIS — I3139 Other pericardial effusion (noninflammatory): Secondary | ICD-10-CM

## 2019-02-09 ENCOUNTER — Telehealth: Payer: Self-pay | Admitting: Nurse Practitioner

## 2019-02-09 ENCOUNTER — Other Ambulatory Visit: Payer: Self-pay | Admitting: Nurse Practitioner

## 2019-02-09 DIAGNOSIS — R079 Chest pain, unspecified: Secondary | ICD-10-CM

## 2019-02-09 DIAGNOSIS — I700 Atherosclerosis of aorta: Secondary | ICD-10-CM

## 2019-02-09 DIAGNOSIS — I313 Pericardial effusion (noninflammatory): Secondary | ICD-10-CM

## 2019-02-09 DIAGNOSIS — I3139 Other pericardial effusion (noninflammatory): Secondary | ICD-10-CM

## 2019-02-09 NOTE — Telephone Encounter (Signed)
Called and spoke with patient to advise that lab work for his coronary CT has been ordered to be done at [facility] in [place] or at [facility]. He verbalized understanding and thanked me for the call.

## 2019-02-16 LAB — BASIC METABOLIC PANEL
BUN/Creatinine Ratio: 21 (ref 10–24)
BUN: 32 mg/dL — ABNORMAL HIGH (ref 8–27)
CO2: 19 mmol/L — ABNORMAL LOW (ref 20–29)
Calcium: 9.7 mg/dL (ref 8.6–10.2)
Chloride: 102 mmol/L (ref 96–106)
Creatinine, Ser: 1.55 mg/dL — ABNORMAL HIGH (ref 0.76–1.27)
GFR calc Af Amer: 49 mL/min/{1.73_m2} — ABNORMAL LOW (ref >59)
GFR calc non Af Amer: 43 mL/min/{1.73_m2} — ABNORMAL LOW (ref >59)
Glucose: 124 mg/dL — ABNORMAL HIGH (ref 65–99)
Potassium: 4.2 mmol/L (ref 3.5–5.2)
Sodium: 138 mmol/L (ref 134–144)

## 2019-02-21 ENCOUNTER — Telehealth (HOSPITAL_COMMUNITY): Payer: Self-pay | Admitting: Emergency Medicine

## 2019-02-21 NOTE — Telephone Encounter (Signed)
Left message on voicemail with name and callback number Colie Fugitt RN Navigator Cardiac Imaging [place] [facility] [phone] Office [phone] Cell  

## 2019-02-22 ENCOUNTER — Other Ambulatory Visit: Payer: Self-pay

## 2019-02-22 ENCOUNTER — Ambulatory Visit (HOSPITAL_COMMUNITY)
Admission: RE | Admit: 2019-02-22 | Discharge: 2019-02-22 | Disposition: A | Discharge status: Home / Self Care | Payer: Medicare HMO | Source: Ambulatory Visit | Attending: Cardiovascular Disease | Admitting: Cardiovascular Disease

## 2019-02-22 DIAGNOSIS — I313 Pericardial effusion (noninflammatory): Secondary | ICD-10-CM | POA: Diagnosis not present

## 2019-02-22 DIAGNOSIS — I2510 Atherosclerotic heart disease of native coronary artery without angina pectoris: Secondary | ICD-10-CM

## 2019-02-22 DIAGNOSIS — R079 Chest pain, unspecified: Secondary | ICD-10-CM | POA: Diagnosis not present

## 2019-02-22 MED ORDER — NITROGLYCERIN 0.4 MG SL SUBL
SUBLINGUAL_TABLET | SUBLINGUAL | Status: AC
  Administered 2019-02-22: 0.8 mg via SUBLINGUAL
  Filled 2019-02-22: qty 2

## 2019-02-22 MED ORDER — IOHEXOL 350 MG/ML SOLN
80.0000 mL | Freq: Once | INTRAVENOUS | Status: AC | PRN
  Administered 2019-02-22: 80 mL via INTRAVENOUS

## 2019-02-22 MED ORDER — NITROGLYCERIN 0.4 MG SL SUBL: 0.8000 mg | SUBLINGUAL_TABLET | SUBLINGUAL | Status: DC | PRN

## 2019-02-25 ENCOUNTER — Ambulatory Visit (HOSPITAL_COMMUNITY)
Admission: RE | Admit: 2019-02-25 | Discharge: 2019-02-25 | Disposition: A | Discharge status: Home / Self Care | Payer: Medicare HMO | Source: Ambulatory Visit | Attending: Cardiovascular Disease | Admitting: Cardiovascular Disease

## 2019-02-25 DIAGNOSIS — R079 Chest pain, unspecified: Secondary | ICD-10-CM | POA: Insufficient documentation

## 2019-02-25 DIAGNOSIS — I2510 Atherosclerotic heart disease of native coronary artery without angina pectoris: Secondary | ICD-10-CM | POA: Diagnosis not present

## 2019-02-25 DIAGNOSIS — I313 Pericardial effusion (noninflammatory): Secondary | ICD-10-CM | POA: Diagnosis not present

## 2019-02-25 DIAGNOSIS — I700 Atherosclerosis of aorta: Secondary | ICD-10-CM | POA: Insufficient documentation

## 2019-02-26 DIAGNOSIS — I2510 Atherosclerotic heart disease of native coronary artery without angina pectoris: Secondary | ICD-10-CM | POA: Diagnosis not present

## 2019-02-28 ENCOUNTER — Telehealth: Payer: Self-pay | Admitting: Cardiovascular Disease

## 2019-02-28 DIAGNOSIS — I25118 Atherosclerotic heart disease of native coronary artery with other forms of angina pectoris: Secondary | ICD-10-CM

## 2019-02-28 MED ORDER — ROSUVASTATIN CALCIUM 10 MG PO TABS: 10.0000 mg | ORAL_TABLET | Freq: Every day | ORAL | 3 refills | Status: DC

## 2019-02-28 MED ORDER — NITROGLYCERIN 0.4 MG SL SUBL: 0.4000 mg | SUBLINGUAL_TABLET | SUBLINGUAL | 3 refills | Status: AC | PRN

## 2019-02-28 NOTE — Telephone Encounter (Signed)
Patient made aware of lab results and recommendations to d/c simvastatin and start crestor 10 mg QD and SL NTG prn. Rxs sent to preferred pharmacy. Fasting labs scheduled for 05/02/19. Patient verbalized understanding to all instruction and denies having any questions at this time.

## 2019-02-28 NOTE — Telephone Encounter (Signed)
-----   Message from Thayer Headings, MD sent at 02/28/2019  1:31 PM EST ----- He is currenlty on simvastatin.  LDL was 98 ( at [facility] lab)  His goal is now < 70 ( Coronary calcium score of 503. This was 55 percentile for age andsex matched control.)\   Lets DC Simvastatin ,  start rosuvastatin 10 mg a day  Check lipids, liver enz, bmp in 2-3 months

## 2019-02-28 NOTE — Telephone Encounter (Signed)
New Message  Patient is returning call to go over CT results. Please give patient a call back.

## 2019-02-28 NOTE — Telephone Encounter (Signed)
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   He has a small vessel ( OM1) that has a stenosis that may be causing his intermittant CP He is on metoprol and lisinopril which have his BP very low Would not likely be able to tolerate Imdur currently. Lets try NTG 0.4 mg SL PRN chest pain.   If this results in significant improvement, we will lower the dose of the Lisinopril and add low dose Imdur .

## 2019-03-08 ENCOUNTER — Telehealth: Payer: Self-pay | Admitting: Orthopedic Surgery

## 2019-03-08 NOTE — Telephone Encounter (Signed)
Patient called to relay that he is having some back problems, mid-back area, and inquired about appointment with Dr Aline Brochure. Discussed history of his back issue; has been treating with another provider at [facility] recently; therefore, it would be 2nd opinion, which I relayed Dr Aline Brochure does not provide 2nd opinion on this type of medical issue as is not a back specialist. Michela Pitcher will talk with his current provider to let them know he does not feel he is getting better.

## 2019-03-09 ENCOUNTER — Telehealth: Payer: Self-pay

## 2019-03-09 NOTE — Telephone Encounter (Signed)
-----   Message from Thayer Headings, MD sent at 03/09/2019 10:35 AM EST ----- Abnormal coronary CT angiogram.  Suggestive of a tight stenosis of OM1 Please make sure he has NTG 0.4 mg SL PRN chest pain ( its listed on his med list )  He is already on ASA 81 mg a day according to med list.   Please set up an appt with an APP to schedule cardiac cath .

## 2019-03-09 NOTE — Telephone Encounter (Signed)
I spoke to patient and gave him Dr Elmarie Shiley advisement.  He is scheduled with Gae Bon on 1/7 to discuss cath.

## 2019-03-10 ENCOUNTER — Ambulatory Visit: Payer: Medicare HMO | Admitting: Cardiology

## 2019-03-10 ENCOUNTER — Encounter: Payer: Self-pay | Admitting: Cardiology

## 2019-03-10 ENCOUNTER — Other Ambulatory Visit: Payer: Self-pay

## 2019-03-10 VITALS — BP 100/60 | HR 83 | Ht 72.0 in | Wt 209.0 lb

## 2019-03-10 DIAGNOSIS — I25118 Atherosclerotic heart disease of native coronary artery with other forms of angina pectoris: Secondary | ICD-10-CM | POA: Diagnosis not present

## 2019-03-10 DIAGNOSIS — N1831 Chronic kidney disease, stage 3a: Secondary | ICD-10-CM | POA: Diagnosis not present

## 2019-03-10 DIAGNOSIS — I313 Pericardial effusion (noninflammatory): Secondary | ICD-10-CM

## 2019-03-10 DIAGNOSIS — E785 Hyperlipidemia, unspecified: Secondary | ICD-10-CM | POA: Diagnosis not present

## 2019-03-10 DIAGNOSIS — I3139 Other pericardial effusion (noninflammatory): Secondary | ICD-10-CM

## 2019-03-10 NOTE — Patient Instructions (Addendum)
Medication Instructions:  Your physician recommends that you continue on your current medications as directed. Please refer to the Current Medication list given to you today.  *If you need a refill on your cardiac medications before your next appointment, please call your pharmacy*  Lab Work: Return for labs on 05/03/2019  If you have labs (blood work) drawn today and your tests are completely normal, you will receive your results only by: [facility] MyChart Message (if you have MyChart) OR . A paper copy in the mail If you have any lab test that is abnormal or we need to change your treatment, we will call you to review the results.  Testing/Procedures: None ordered   Follow-Up: You are scheduled to see Dr. Acie Fredrickson 05/03/2019 @ 8:20 AM  Other Instructions  Lifestyle Modifications to Prevent and Treat Heart Disease -Recommend heart healthy/Mediterranean diet, with whole grains, fruits, vegetables, fish, lean meats, nuts, olive oil and avocado oil.  -Limit salt intake to less than 2000 mg per day.  -Recommend moderate walking, starting slowly with a few minutes and working up to 3-5 times/week for 30-50 minutes each session. Aim for at least 150 minutes.week. Goal should be pace of 3 miles/hours, or walking 1.5 miles in 30 minutes -Recommend avoidance of tobacco products. Avoid excess alcohol. -Keep blood pressure well controlled, ideally less than 130/80.   ================================================   Mediterranean Diet A Mediterranean diet refers to food and lifestyle choices that are based on the traditions of countries located on the [employer]. This way of eating has been shown to help prevent certain conditions and improve outcomes for people who have chronic diseases, like kidney disease and heart disease. What are tips for following this plan? Lifestyle  Cook and eat meals together with your family, when possible.  Drink enough fluid to keep your urine clear or pale  yellow.  Be physically active every day. This includes: ? Aerobic exercise like running or swimming. ? Leisure activities like gardening, walking, or housework.  Get 7-8 hours of sleep each night.  If recommended by your health care provider, drink red wine in moderation. This means 1 glass a day for nonpregnant women and 2 glasses a day for men. A glass of wine equals 5 oz (150 mL). Reading food labels   Check the serving size of packaged foods. For foods such as rice and pasta, the serving size refers to the amount of cooked product, not dry.  Check the total fat in packaged foods. Avoid foods that have saturated fat or trans fats.  Check the ingredients list for added sugars, such as corn syrup. Shopping  At the grocery store, buy most of your food from the areas near the walls of the store. This includes: ? Fresh fruits and vegetables (produce). ? Grains, beans, nuts, and seeds. Some of these may be available in unpackaged forms or large amounts (in bulk). ? Fresh seafood. ? Poultry and eggs. ? Low-fat dairy products.  Buy whole ingredients instead of prepackaged foods.  Buy fresh fruits and vegetables in-season from local farmers markets.  Buy frozen fruits and vegetables in resealable bags.  If you do not have access to quality fresh seafood, buy precooked frozen shrimp or canned fish, such as tuna, salmon, or sardines.  Buy small amounts of raw or cooked vegetables, salads, or olives from the deli or salad bar at your store.  Stock your pantry so you always have certain foods on hand, such as olive oil, canned tuna, canned tomatoes, rice,  pasta, and beans. Cooking  Cook foods with extra-virgin olive oil instead of using butter or other vegetable oils.  Have meat as a side dish, and have vegetables or grains as your main dish. This means having meat in small portions or adding small amounts of meat to foods like pasta or stew.  Use beans or vegetables instead of meat  in common dishes like chili or lasagna.  Experiment with different cooking methods. Try roasting or broiling vegetables instead of steaming or sauteing them.  Add frozen vegetables to soups, stews, pasta, or rice.  Add nuts or seeds for added healthy fat at each meal. You can add these to yogurt, salads, or vegetable dishes.  Marinate fish or vegetables using olive oil, lemon juice, garlic, and fresh herbs. Meal planning   Plan to eat 1 vegetarian meal one day each week. Try to work up to 2 vegetarian meals, if possible.  Eat seafood 2 or more times a week.  Have healthy snacks readily available, such as: ? Vegetable sticks with hummus. ? [place] yogurt. ? Fruit and nut trail mix.  Eat balanced meals throughout the week. This includes: ? Fruit: 2-3 servings a day ? Vegetables: 4-5 servings a day ? Low-fat dairy: 2 servings a day ? Fish, poultry, or lean meat: 1 serving a day ? Beans and legumes: 2 or more servings a week ? Nuts and seeds: 1-2 servings a day ? Whole grains: 6-8 servings a day ? Extra-virgin olive oil: 3-4 servings a day  Limit red meat and sweets to only a few servings a month What are my food choices?  Mediterranean diet ? Recommended  Grains: Whole-grain pasta. Brown rice. Bulgar wheat. Polenta. Couscous. Whole-wheat bread. [place].  Vegetables: Artichokes. Beets. Broccoli. Cabbage. Carrots. Eggplant. Green beans. Chard. Kale. Spinach. Onions. Leeks. Peas. Squash. Tomatoes. Peppers. Radishes.  Fruits: Apples. Apricots. Avocado. Berries. Bananas. Cherries. Dates. Figs. Grapes. Lemons. Melon. Oranges. Peaches. Plums. Pomegranate.  Meats and other protein foods: Beans. Almonds. Sunflower seeds. Pine nuts. Peanuts. [place]. Salmon. Scallops. Shrimp. [place]. Tilapia. Clams. Oysters. Eggs.  Dairy: Low-fat milk. Cheese. Greek yogurt.  Beverages: Water. Red wine. Herbal tea.  Fats and oils: Extra virgin olive oil. Avocado oil. Grape seed oil.  Sweets and  desserts: [place] yogurt with honey. Baked apples. Poached pears. Trail mix.  Seasoning and other foods: Basil. Cilantro. Coriander. Cumin. Mint. Parsley. Sage. Rosemary. Tarragon. Garlic. Oregano. Thyme. Pepper. Balsalmic vinegar. Tahini. Hummus. Tomato sauce. Olives. Mushrooms. ? Limit these  Grains: Prepackaged pasta or rice dishes. Prepackaged cereal with added sugar.  Vegetables: Deep fried potatoes (french fries).  Fruits: Fruit canned in syrup.  Meats and other protein foods: Beef. Pork. Lamb. Poultry with skin. Hot dogs. [place] Salines.  Dairy: Ice cream. Sour cream. Whole milk.  Beverages: Juice. Sugar-sweetened soft drinks. Beer. Liquor and spirits.  Fats and oils: Butter. Canola oil. Vegetable oil. Beef fat (tallow). Lard.  Sweets and desserts: Cookies. Cakes. Pies. Candy.  Seasoning and other foods: Mayonnaise. Premade sauces and marinades. The items listed may not be a complete list. Talk with your dietitian about what dietary choices are right for you. Summary  The Mediterranean diet includes both food and lifestyle choices.  Eat a variety of fresh fruits and vegetables, beans, nuts, seeds, and whole grains.  Limit the amount of red meat and sweets that you eat.  Talk with your health care provider about whether it is safe for you to drink red wine in moderation. This means 1 glass a day  for nonpregnant women and 2 glasses a day for men. A glass of wine equals 5 oz (150 mL). This information is not intended to replace advice given to you by your health care provider. Make sure you discuss any questions you have with your health care provider. Document Revised: 10/18/2015 Document Reviewed: 10/11/2015 Elsevier Patient Education  [place].    Nitroglycerin sublingual tablets What is this medicine? NITROGLYCERIN (nye troe GLI ser in) is a type of vasodilator. It relaxes blood vessels, increasing the blood and oxygen supply to your heart. This medicine is used to  relieve chest pain caused by angina. It is also used to prevent chest pain before activities like climbing stairs, going outdoors in cold weather, or sexual activity. This medicine may be used for other purposes; ask your health care provider or pharmacist if you have questions. COMMON BRAND NAME(S): Nitroquick, Nitrostat, Nitrotab What should I tell my health care provider before I take this medicine? They need to know if you have any of these conditions:  anemia  head injury, recent stroke, or bleeding in the brain  liver disease  previous heart attack  an unusual or allergic reaction to nitroglycerin, other medicines, foods, dyes, or preservatives  pregnant or trying to get pregnant  breast-feeding How should I use this medicine? Take this medicine by mouth as needed. At the first sign of an angina attack (chest pain or tightness) place one tablet under your tongue. You can also take this medicine 5 to 10 minutes before an event likely to produce chest pain. Follow the directions on the prescription label. Let the tablet dissolve under the tongue. Do not swallow whole. Replace the dose if you accidentally swallow it. It will help if your mouth is not dry. Saliva around the tablet will help it to dissolve more quickly. Do not eat or drink, smoke or chew tobacco while a tablet is dissolving. If you are not better within 5 minutes after taking ONE dose of nitroglycerin, call 9-1-1 immediately to seek emergency medical care. Do not take more than 3 nitroglycerin tablets over 15 minutes. If you take this medicine often to relieve symptoms of angina, your doctor or health care professional may provide you with different instructions to manage your symptoms. If symptoms do not go away after following these instructions, it is important to call 9-1-1 immediately. Do not take more than 3 nitroglycerin tablets over 15 minutes. Talk to your pediatrician regarding the use of this medicine in children.  Special care may be needed. Overdosage: If you think you have taken too much of this medicine contact a poison control center or emergency room at once. NOTE: This medicine is only for you. Do not share this medicine with others. What if I miss a dose? This does not apply. This medicine is only used as needed. What may interact with this medicine? Do not take this medicine with any of the following medications:  certain migraine medicines like ergotamine and dihydroergotamine (DHE)  medicines used to treat erectile dysfunction like sildenafil, tadalafil, and vardenafil  riociguat This medicine may also interact with the following medications:  alteplase  aspirin  heparin  medicines for high blood pressure  medicines for mental depression  other medicines used to treat angina  phenothiazines like chlorpromazine, mesoridazine, prochlorperazine, thioridazine This list may not describe all possible interactions. Give your health care provider a list of all the medicines, herbs, non-prescription drugs, or dietary supplements you use. Also tell them if you smoke,  drink alcohol, or use illegal drugs. Some items may interact with your medicine. What should I watch for while using this medicine? Tell your doctor or health care professional if you feel your medicine is no longer working. Keep this medicine with you at all times. Sit or lie down when you take your medicine to prevent falling if you feel dizzy or faint after using it. Try to remain calm. This will help you to feel better faster. If you feel dizzy, take several deep breaths and lie down with your feet propped up, or bend forward with your head resting between your knees. You may get drowsy or dizzy. Do not drive, use machinery, or do anything that needs mental alertness until you know how this drug affects you. Do not stand or sit up quickly, especially if you are an older patient. This reduces the risk of dizzy or fainting  spells. Alcohol can make you more drowsy and dizzy. Avoid alcoholic drinks. Do not treat yourself for coughs, colds, or pain while you are taking this medicine without asking your doctor or health care professional for advice. Some ingredients may increase your blood pressure. What side effects may I notice from receiving this medicine? Side effects that you should report to your doctor or health care professional as soon as possible:  blurred vision  dry mouth  skin rash  sweating  the feeling of extreme pressure in the head  unusually weak or tired Side effects that usually do not require medical attention (report to your doctor or health care professional if they continue or are bothersome):  flushing of the face or neck  headache  irregular heartbeat, palpitations  nausea, vomiting This list may not describe all possible side effects. Call your doctor for medical advice about side effects. You may report side effects to FDA at 1-800-FDA-1088. Where should I keep my medicine? Keep out of the reach of children. Store at room temperature between 20 and 25 degrees C (68 and 77 degrees F). Store in [employer]. Protect from light and moisture. Keep tightly closed. Throw away any unused medicine after the expiration date. NOTE: This sheet is a summary. It may not cover all possible information. If you have questions about this medicine, talk to your doctor, pharmacist, or health care provider.  2020 Elsevier/Gold Standard (2012-12-16 17:57:36)

## 2019-03-10 NOTE — Progress Notes (Signed)
Cardiology Office Note:    Date:  03/10/2019   ID:  Mike Roach, DOB November 25, 1940, MRN [id number]  PCP:  Sandi Mealy, MD  Cardiologist:  Mertie Moores, MD  Referring MD: Sandi Mealy, MD   Chief Complaint  Patient presents with  . Coronary Artery Disease    Abnormal CCTA, plan for cath    History of Present Illness:    Mike Roach is a 79 y.o. 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  The patient was recently referred to cardiology and seen by Dr. Katharina Caper on 01/05/2019 due to finding of pericardial effusion by Dr. Lorra Hals.  The patient has CT scans on a regular basis for his cancers.  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  He was also noted to have some atherosclerosis in his aorta.  Patient reported some chest tightness with walking.  An echocardiogram was done on 01/12/2019 that showed normal LV systolic function with EF 60-65%, borderline LVH, grade 2 diastolic dysfunction and trace mitral regurgitation with no evidence of pericardial effusion.  With history of hyperlipidemia and atherosclerosis noted on prior CT scans the patient underwent coronary CTA on 02/22/2019 showed mostly mild nonobstructive disease.  There was intermediate stenosis (50-69%) of OM1 and severe stenosis of a branch of OM1, 70-99% (small vessell, ~2.67m in diameter) small OM 1 branch.  Coronary calcium score of 503.  It again showed small to moderate pericardial effusion.  [facility] showed TC 169, HDL 46, LDL 98, triglycerides 126.  The patient is here today for follow-up of his coronary CT scan and plan for cardiac cath. He has not been having any further chest tightness, but he has not been walking much due to back issues.  No orthopnea, PND.    He reports some swelling of the right leg related to his back issues. No pedal/ankle edema. He had an MRI yesterday.  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  Cardiac studies   Coronary CTA 02/22/2019 Coronary Arteries:  Normal coronary origin.  Right dominance.  RCA is a large dominant artery that gives rise to PDA and PLA. There is mixed ostial disease causing 25-49% stenosis. Calcified plaque in the proximal RCA causes mild (25-49%) stenosis. Calcified plaque in the mid RCA causes 0-24% stenosis.  Left main is a large artery that gives rise to LAD and LCX arteries. Calcified plaque in the left main causes 0-24% stenosis  LAD is a large vessel. There is calcified plaque in the proximal and mid LAD causing 0-24% stenosis. Noncalcified plaque in the proximal LAD causes mild (25-49%) stenosis. Calcified plaque in the distal LAD causes mild (25-49%) stenosis.  LCX is small and non-dominant artery with a large OM1 branch. There is calcified plaque in the proximal LCX causing minimal (0-24%) stenosis. Noncalcified plaque in the mid OM1 causes intermediate (50-69%) stenosis. Calcified plaque in a branch off of OM1 (small vessell, measures ~2.574min diameter) causes severe (70-99%) stenosis  Other findings: Left Ventricle: Normal size Left Atrium: Mild enlargement Pulmonary Veins: Normal configuration Right Ventricle: Normal size Right Atrium: Mild enlargement Thoracic aorta: Normal size Pulmonary Arteries: Normal size Systemic Veins: Normal drainage  Pericardium: Small  to moderate effusion adjacent to RV  IMPRESSION: 1. Coronary calcium score of 503. This was 83 percentile for age and sex matched control. 2. Normal coronary origin with right dominance. 3. There is calcified plaque in a branch off of OM1 (small vessell, measures ~2.29m in diameter) that causes  severe (70-99%) stenosis. CTFFR across this lesion is 0.75, suggesting functional significance. 4. Nonobstructive coronary disease in the remaining arteries  5. Small to moderate pericardial effusion adjacent to RV  CAD-RADS 4 Severe stenosis.  (70-99% or > 50% left main)  Echocardiogram 01/12/2019 IMPRESSIONS  1. Left ventricular ejection fraction, by visual estimation, is 60 to 65%. The left ventricle has normal function. There is borderline left ventricular hypertrophy.  2. Left ventricular diastolic parameters are consistent with Grade II diastolic dysfunction (pseudonormalization).  3. Global right ventricle has normal systolic function.The right ventricular size is normal. No increase in right ventricular wall thickness.  4. Left atrial size was mildly dilated.  5. Right atrial size was mild-moderately dilated.  6. The mitral valve is normal in structure. Trace mitral valve regurgitation.  7. The tricuspid valve is normal in structure. Tricuspid valve regurgitation is trivial.  8. The aortic valve is normal in structure. Aortic valve regurgitation is not visualized.  9. The pulmonic valve was normal in structure. Pulmonic valve regurgitation is not visualized. 10. Normal pulmonary artery systolic pressure. 11. The atrial septum is grossly normal.   Past Medical History:  Diagnosis Date  . Adenomatous colon polyp 09/21/2014  . Anemia in chronic illness 03/07/2015   being evaluated  . Arthritis   . Atrial flutter ([place]   . Bilateral hearing loss 03/15/2015  . Cancer of intestine ([place]   . Chronic kidney disease (CKD) 03/07/2015  . Chronic kidney disease, stage III (moderate) 03/07/2015  . Chronic right shoulder pain 02/03/2018  . GERD (gastroesophageal reflux disease)   . GIST (gastrointestinal stroma tumor), malignant, colon ([place]    Duodenom,   . Glaucoma    both eyes  . Hearing impairment   . Hypercholesterolemia   . Hypertension   . Iron deficiency anemia 03/15/2015    . Kidney lesion, native, left 07/12/2015  . Lateral meniscus derangement 01/12/2013  . 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  . Osteoarthritis of right knee 01/12/2013  . Prostate enlargement   . Skin lesion of left leg 03/07/2015    Past Surgical History:  Procedure Laterality Date  . colonoscopy    . ESOPHAGOGASTRODUODENOSCOPY (EGD) WITH PROPOFOL N/A 06/08/2017   Procedure: ESOPHAGOGASTRODUODENOSCOPY (EGD) WITH PROPOFOL;  Surgeon: GGatha Mayer MD;  Location: [facility];  Service: Endoscopy;  Laterality: N/A;  . EUS N/A 07/05/2015   Procedure: UPPER ENDOSCOPIC ULTRASOUND (EUS) LINEAR;  Surgeon: DMilus Banister MD;  Location: [facility];  Service: Endoscopy;  Laterality: N/A;  . KNEE ARTHROSCOPY WITH LATERAL MENISECTOMY Right 01/21/2013   Procedure: KNEE ARTHROSCOPY WITH PARTIAL LATERAL MENISECTOMY;  Surgeon: SCarole Civil MD;  Location: [facility];  Service: Orthopedics;  Laterality: Right;  . ORIF ANKLE DISLOCATION Right 15 yrs ago  . ROBOTIC ASSITED PARTIAL NEPHRECTOMY Left 10/17/2015   Procedure: XI ROBOTIC ASSITED LEFT PARTIAL NEPHRECTOMY, Left Cyst Decortication, Left Renal Ultrasound;  Surgeon: TAlexis Frock MD;  Location: [facility];  Service: Urology;  Laterality: Left;    Current Medications: Current Meds  Medication Sig  . ARTIFICIAL TEAR OINTMENT OP Place 1 application into both eyes daily as needed (For dry eyes.).  .Marland Kitchenaspirin EC 81 MG tablet Take 81 mg by mouth  daily.  . b complex vitamins capsule Take 1 capsule by mouth daily.  . dorzolamide (TRUSOPT) 2 % ophthalmic solution Place 1 drop into both eyes 2 (two) times daily.   . ferrous sulfate 325 (65 FE) MG tablet Take 1 tablet (325 mg total) by mouth daily.  . finasteride (PROSCAR) 5 MG tablet Take 5 mg by mouth every evening.   . hydrocortisone (ANUSOL-HC) 25 MG suppository Place 25 mg rectally daily as needed for hemorrhoids or anal itching.  . hydrocortisone 2.5 % cream Apply topically 2 (two) times daily.  .  indomethacin (INDOCIN) 25 MG capsule Take 25 mg by mouth 2 (two) times daily with a meal.  . lanolin ointment Apply topically as needed (apply to eye).  Marland Kitchen latanoprost (XALATAN) 0.005 % ophthalmic solution Place 1 drop into both eyes at bedtime.  Marland Kitchen lisinopril (PRINIVIL,ZESTRIL) 20 MG tablet Take 20 mg by mouth every morning.   . metoprolol tartrate (LOPRESSOR) 50 MG tablet Take 1 pill (50 mg) 2 hours before your coronary CT  . Multiple Vitamins-Minerals (MULTIVITAMINS THER. W/MINERALS) TABS tablet Take 1 tablet by mouth daily.  . nitroGLYCERIN (NITROSTAT) 0.4 MG SL tablet Place 1 tablet (0.4 mg total) under the tongue every 5 (five) minutes as needed for chest pain.  . Omega-3 Fatty Acids (FISH OIL PO) Take 4 capsules by mouth daily.  Marland Kitchen omeprazole (PRILOSEC) 20 MG capsule Take 20 mg by mouth daily.  . rosuvastatin (CRESTOR) 10 MG tablet Take 1 tablet (10 mg total) by mouth daily.  . travoprost, benzalkonium, (TRAVATAN) 0.004 % ophthalmic solution Place 1 drop into both eyes at bedtime.   . triamcinolone cream (KENALOG) 0.1 % Apply 1 application topically 2 (two) times daily.     Allergies:   Patient has no known allergies.   Social History   Socioeconomic History  . Marital status: Divorced    Spouse name: Not on file  . Number of children: Not on file  . Years of education: Not on file  . Highest education level: Not on file  Occupational History  . Occupation: Security guard  Tobacco Use  . Smoking status: Former Smoker    Packs/day: 1.00    Years: 20.00    Pack years: 20.00    Types: Cigarettes    Quit date: 01/19/1981    Years since quitting: 38.1  . Smokeless tobacco: Never Used  Substance and Sexual Activity  . Alcohol use: No  . Drug use: No  . 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 Not on file  Social History Narrative   Divorced   Employed as a Museum/gallery curator   Former smoker but no current tobacco no alcohol or drug use   Social Determinants of Radio broadcast assistant Strain:   . Difficulty of Paying Living Expenses: Not on file  Food Insecurity:   . Worried About Charity fundraiser in the Last Year: Not on file  . Ran Out of Food in the Last Year: Not on file  Transportation Needs:   . Lack of Transportation (Medical): Not on file  . Lack of Transportation (Non-Medical): Not on file  Physical Activity:   . Days of Exercise per Week: Not on file  . Minutes of Exercise per Session: Not on file  Stress:   . Feeling of Stress : Not on file  Social Connections:   .  Frequency of Communication with Friends and Family: Not on file  . Frequency of Social Gatherings with Friends and Family: Not on file  . Attends Religious Services: Not on file  . Active Member of Clubs or Organizations: Not on file  . Attends Archivist Meetings: Not on file  . Marital Status: Not on file     Family History: The patient's family history includes Hypertension in his father. ROS:   Please see the history of present illness.     All other systems reviewed and are negative.   EKG:  EKG is ordered today.  The ekg ordered today demonstrates NSR with PVC, 83 bpm, probable LVH  Recent Labs: 11/04/2018: ALT 16; Hemoglobin 11.2; Platelets 247 02/15/2019: BUN 32; Creatinine, Ser 1.55; Potassium 4.2; Sodium 138   Recent Lipid Panel No results found for: CHOL, TRIG, HDL, CHOLHDL, VLDL, LDLCALC, LDLDIRECT  Physical Exam:    VS:  BP 100/60   Pulse 83   Ht 6' (1.829 m)   Wt 209 lb (94.8 kg)   SpO2 98%   BMI 28.35 kg/m     Wt Readings from Last 6 Encounters:  03/10/19 209 lb (94.8 kg)  01/05/19 215 lb 6.4 oz (97.7 kg)  11/05/18 213 lb 6.4 oz (96.8 kg)  02/03/18 220 lb (99.8 kg)  11/06/17 220 lb (99.8 kg)  10/14/17 220 lb (99.8 kg)     Physical Exam  Constitutional: He is oriented to person, place, and time. He appears  well-developed and well-nourished. No distress.  HENT:  Head: Normocephalic and atraumatic.  Neck: No JVD present.  Cardiovascular: Normal rate, regular rhythm, normal heart sounds and intact distal pulses. Exam reveals no gallop and no friction rub.  No murmur heard. Pulmonary/Chest: Effort normal and breath sounds normal. No respiratory distress. He has no wheezes. He has no rales.  Abdominal: Soft. Bowel sounds are normal.  Musculoskeletal:        General: No edema. Normal range of motion.     Cervical back: Normal range of motion and neck supple.  Neurological: He is alert and oriented to person, place, and time.  Skin: Skin is warm and dry.  Psychiatric: He has a normal mood and affect. His behavior is normal. Judgment and thought content normal.  Vitals reviewed.   ASSESSMENT:    1. Coronary artery disease of native heart with stable angina pectoris, unspecified vessel or lesion type ([place])   2. Pericardial effusion   3. Hyperlipidemia LDL goal <70   4. Stage 3a chronic kidney disease    PLAN:    In order of problems listed above:  CAD -Coronary CTA on 02/22/2019 showed mostly mild nonobstructive disease.  There was Noncalcified plaque in the mid OM1 causes intermediate (50-69%) stenosis. Calcified plaque in a branch off of OM1 (small vessell, measures ~2.36m in diameter) causes severe (70-99%) Stenosis. Coronary calcium score of 503.  -Patient had soft blood pressure so felt likely unable to tolerate long-acting nitrate.  Patient was advised to use nitroglycerin as needed. -Has had no further chest tightness, but has not been walking much due to back issues.  -Patient is on aspirin, beta-blocker and a statin -Discussed risk factor modification, heart healthy diet and exercise. -Per discussion with Dr. NAcie Fredrickson we recommend cardiac catheterization to definitively evaluate coronary arteries and possible PCI. I explained the CT findings and recommendation for cath. The patient  says that he has "a lot going on right now" and does nor want to do cath at  this time. He says that March would be better for him. I will have him seen back in March to arrange cath. He would benefit from 4 hours of IV fluids prior to cath due to renal function.  -I reviewed chest pain, use of nitroglycerin and when to seek emergency medical attention.   Pericardial effusion -Dr. Acie Fredrickson noted that this has been present over multiple CT scans done for his history of cancer and felt that this was chronic and unchanged. - echocardiogram done on 01/12/2019 showed normal LV systolic function with EF 60-65%, borderline LVH, grade 2 diastolic dysfunction and trace mitral regurgitation with no evidence of pericardial effusion.   -With CKD, would be careful with use of indomethacin.  This is not likely to be of much benefit.  Hyperlipidemia, LDL goal <70 -LDL 98 per [facility] -Simvastatin was switched to Crestor 10 mg daily. -We will follow-up lipid panel, liver enzymes in March when he comes back to discuss cath.  CKD stage III -Serum creatinine 1.55 and eGFR 49. -Patient with history of renal cell carcinoma with partial nephrectomy.  Follows with PCP.   Medication Adjustments/Labs and Tests Ordered: Current medicines are reviewed at length with the patient today.  Concerns regarding medicines are outlined above. Labs and tests ordered and medication changes are outlined in the patient instructions below:  Patient Instructions  Lifestyle Modifications to Prevent and Treat Heart Disease -Recommend heart healthy/Mediterranean diet, with whole grains, fruits, vegetables, fish, lean meats, nuts, olive oil and avocado oil.  -Limit salt intake to less than 2000 mg per day.  -Recommend moderate walking, starting slowly with a few minutes and working up to 3-5 times/week for 30-50 minutes each session. Aim for at least 150 minutes.week. Goal should be pace of 3 miles/hours, or walking 1.5 miles in 30  minutes -Recommend avoidance of tobacco products. Avoid excess alcohol. -Keep blood pressure well controlled, ideally less than 130/80.   ================================================   Mediterranean Diet A Mediterranean diet refers to food and lifestyle choices that are based on the traditions of countries located on the [employer]. This way of eating has been shown to help prevent certain conditions and improve outcomes for people who have chronic diseases, like kidney disease and heart disease. What are tips for following this plan? Lifestyle  Cook and eat meals together with your family, when possible.  Drink enough fluid to keep your urine clear or pale yellow.  Be physically active every day. This includes: ? Aerobic exercise like running or swimming. ? Leisure activities like gardening, walking, or housework.  Get 7-8 hours of sleep each night.  If recommended by your health care provider, drink red wine in moderation. This means 1 glass a day for nonpregnant women and 2 glasses a day for men. A glass of wine equals 5 oz (150 mL). Reading food labels   Check the serving size of packaged foods. For foods such as rice and pasta, the serving size refers to the amount of cooked product, not dry.  Check the total fat in packaged foods. Avoid foods that have saturated fat or trans fats.  Check the ingredients list for added sugars, such as corn syrup. Shopping  At the grocery store, buy most of your food from the areas near the walls of the store. This includes: ? Fresh fruits and vegetables (produce). ? Grains, beans, nuts, and seeds. Some of these may be available in unpackaged forms or large amounts (in bulk). ? Fresh seafood. ? Poultry and  eggs. ? Low-fat dairy products.  Buy whole ingredients instead of prepackaged foods.  Buy fresh fruits and vegetables in-season from local farmers markets.  Buy frozen fruits and vegetables in resealable bags.  If you do  not have access to quality fresh seafood, buy precooked frozen shrimp or canned fish, such as tuna, salmon, or sardines.  Buy small amounts of raw or cooked vegetables, salads, or olives from the deli or salad bar at your store.  Stock your pantry so you always have certain foods on hand, such as olive oil, canned tuna, canned tomatoes, rice, pasta, and beans. Cooking  Cook foods with extra-virgin olive oil instead of using butter or other vegetable oils.  Have meat as a side dish, and have vegetables or grains as your main dish. This means having meat in small portions or adding small amounts of meat to foods like pasta or stew.  Use beans or vegetables instead of meat in common dishes like chili or lasagna.  Experiment with different cooking methods. Try roasting or broiling vegetables instead of steaming or sauteing them.  Add frozen vegetables to soups, stews, pasta, or rice.  Add nuts or seeds for added healthy fat at each meal. You can add these to yogurt, salads, or vegetable dishes.  Marinate fish or vegetables using olive oil, lemon juice, garlic, and fresh herbs. Meal planning   Plan to eat 1 vegetarian meal one day each week. Try to work up to 2 vegetarian meals, if possible.  Eat seafood 2 or more times a week.  Have healthy snacks readily available, such as: ? Vegetable sticks with hummus. ? [place] yogurt. ? Fruit and nut trail mix.  Eat balanced meals throughout the week. This includes: ? Fruit: 2-3 servings a day ? Vegetables: 4-5 servings a day ? Low-fat dairy: 2 servings a day ? Fish, poultry, or lean meat: 1 serving a day ? Beans and legumes: 2 or more servings a week ? Nuts and seeds: 1-2 servings a day ? Whole grains: 6-8 servings a day ? Extra-virgin olive oil: 3-4 servings a day  Limit red meat and sweets to only a few servings a month What are my food choices?  Mediterranean diet ? Recommended  Grains: Whole-grain pasta. Brown rice. Bulgar wheat.  Polenta. Couscous. Whole-wheat bread. [place].  Vegetables: Artichokes. Beets. Broccoli. Cabbage. Carrots. Eggplant. Green beans. Chard. Kale. Spinach. Onions. Leeks. Peas. Squash. Tomatoes. Peppers. Radishes.  Fruits: Apples. Apricots. Avocado. Berries. Bananas. Cherries. Dates. Figs. Grapes. Lemons. Melon. Oranges. Peaches. Plums. Pomegranate.  Meats and other protein foods: Beans. Almonds. Sunflower seeds. Pine nuts. Peanuts. [place]. Salmon. Scallops. Shrimp. [place]. Tilapia. Clams. Oysters. Eggs.  Dairy: Low-fat milk. Cheese. Greek yogurt.  Beverages: Water. Red wine. Herbal tea.  Fats and oils: Extra virgin olive oil. Avocado oil. Grape seed oil.  Sweets and desserts: [place] yogurt with honey. Baked apples. Poached pears. Trail mix.  Seasoning and other foods: Basil. Cilantro. Coriander. Cumin. Mint. Parsley. Sage. Rosemary. Tarragon. Garlic. Oregano. Thyme. Pepper. Balsalmic vinegar. Tahini. Hummus. Tomato sauce. Olives. Mushrooms. ? Limit these  Grains: Prepackaged pasta or rice dishes. Prepackaged cereal with added sugar.  Vegetables: Deep fried potatoes (french fries).  Fruits: Fruit canned in syrup.  Meats and other protein foods: Beef. Pork. Lamb. Poultry with skin. Hot dogs. Berniece Salines.  Dairy: Ice cream. Sour cream. Whole milk.  Beverages: Juice. Sugar-sweetened soft drinks. Beer. Liquor and spirits.  Fats and oils: Butter. Canola oil. Vegetable oil. Beef fat (tallow). Lard.  Sweets and desserts:  Cookies. Cakes. Pies. Candy.  Seasoning and other foods: Mayonnaise. Premade sauces and marinades. The items listed may not be a complete list. Talk with your dietitian about what dietary choices are right for you. Summary  The Mediterranean diet includes both food and lifestyle choices.  Eat a variety of fresh fruits and vegetables, beans, nuts, seeds, and whole grains.  Limit the amount of red meat and sweets that you eat.  Talk with your health care provider about  whether it is safe for you to drink red wine in moderation. This means 1 glass a day for nonpregnant women and 2 glasses a day for men. A glass of wine equals 5 oz (150 mL). This information is not intended to replace advice given to you by your health care provider. Make sure you discuss any questions you have with your health care provider. Document Revised: 10/18/2015 Document Reviewed: 10/11/2015 Elsevier Patient Education  2020 [place], Daune Perch, NP  03/10/2019 2:57 PM    [facility]

## 2019-04-08 ENCOUNTER — Telehealth: Payer: Self-pay | Admitting: *Deleted

## 2019-04-08 NOTE — Telephone Encounter (Signed)
-----   Message from [name], MD sent at 04/08/2019  7:44 AM EST ----- Shona Simpson,  He is known to have bilateral kidney cysts on CT. His MRI still shows kidney cysts, they are not new, but I am not a radiologist.  I suggest the following 1) get an official read/report from his recent MRI 2) He is a patient of Dr. Dorina Hoyer. I suggest he consult with him. ----- Message ----- From: Paulla Dolly, RN Sent: 04/07/2019  11:54 AM EST To: [name], MD  Pt called and said he had MRI of lumbar spine through [facility] on 03/10/19 and Dr Kathyrn Sheriff was concerned about some areas on his kidneys.  The patient wants you to review the MRI.  I do not see the MRI results in Care Everywhere.  I called Dr Cleotilde Neer office and was told that you should have access to the films in [place](?...  I have no idea what that is...)  If you do not I can call them back and the person I spoke with said she could get the results for me.

## 2019-04-08 NOTE — Telephone Encounter (Signed)
Telephone call to patient and advised results as directed below. Patient confirms he will follow up with Dr. Beatrix Fetters. He appreciated return call.

## 2019-04-12 ENCOUNTER — Ambulatory Visit: Payer: Medicare HMO | Admitting: Cardiovascular Disease

## 2019-05-02 ENCOUNTER — Other Ambulatory Visit: Payer: Medicare HMO

## 2019-05-02 NOTE — Progress Notes (Signed)
Cardiology Office Note:    Date:  05/03/2019   ID:  Mike Roach, DOB [birth year], MRN [id number]  PCP:  Leeanne Rio, MD  Cardiologist:  Chyrel Taha  Electrophysiologist:  None   Referring MD: Leeanne Rio, MD   Chief Complaint  Patient presents with  . Coronary Artery Disease  . Chest Pain    History of Present Illness:    Mike Roach is a 79 y.o. 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 Lorra Hals .   The patient has CT scans on a regular basis for his gastrointestinal cancer as well as his renal cell cancer.  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  He also has been noted to have some atherosclerosis in his aorta.  Has some chest tightness  with walking  May last for a minute   Walks some at work.   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  Busy waking much of the day .   Cholesterol levels from the [facility] medical system from March 15, 2018 reveal a total cholesterol of 169.  The triglyceride level is 126.  HDL is 46.  LDL is 98.  May 03, 2019 Seen today for follow up of pericardial effusion Echo in Nov. 2020 showed a trivial pericardial effusion  No additional complaints.  Has some back pains He had a coronary CT angiogram in December, 2020 that revealed mild RCA disease.  The left main has mild irregularities.  He has mild to moderate stenosis in the LAD system and mild disease in the left circumflex system. Coronary calcium score was 503 which 79th percentile for age and gender matched controls.     Past Medical History:  Diagnosis Date  . Adenomatous colon polyp 09/21/2014  . Anemia in chronic illness 03/07/2015   being evaluated  . Arthritis   . Atrial flutter  ([place])   . Bilateral hearing loss 03/15/2015  . Cancer of intestine ([place])   . Chronic kidney disease (CKD) 03/07/2015  . Chronic kidney disease, stage III (moderate) 03/07/2015  . Chronic right shoulder pain 02/03/2018  . GERD (gastroesophageal reflux disease)   . GIST (gastrointestinal stroma tumor), malignant, colon ([place])    Duodenom,   . Glaucoma    both eyes  . Hearing impairment   . Hypercholesterolemia   . Hypertension   . Iron deficiency anemia 03/15/2015  . Kidney lesion, native, left 07/12/2015  . Lateral meniscus derangement 01/12/2013  . 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  . Osteoarthritis of right knee 01/12/2013  . Prostate enlargement   . Skin lesion of left leg 03/07/2015    Past Surgical History:  Procedure Laterality Date  . colonoscopy    . ESOPHAGOGASTRODUODENOSCOPY (EGD) WITH PROPOFOL N/A 06/08/2017   Procedure: ESOPHAGOGASTRODUODENOSCOPY (EGD) WITH PROPOFOL;  Surgeon: [name], MD;  [facility];  Service: Endoscopy;  Laterality: N/A;  . EUS N/A 07/05/2015   Procedure: UPPER ENDOSCOPIC ULTRASOUND (EUS) LINEAR;  Surgeon: Milus Banister, MD;  [facility];  Service: Endoscopy;  Laterality: N/A;  . KNEE ARTHROSCOPY WITH LATERAL MENISECTOMY Right 01/21/2013   Procedure: KNEE ARTHROSCOPY WITH PARTIAL LATERAL MENISECTOMY;  Surgeon: Carole Civil, MD;  [facility];  Service: Orthopedics;  Laterality: Right;  . ORIF ANKLE DISLOCATION Right 15 yrs ago  . ROBOTIC ASSITED PARTIAL NEPHRECTOMY Left 10/17/2015   Procedure: XI ROBOTIC ASSITED LEFT PARTIAL NEPHRECTOMY, Left Cyst Decortication, Left Renal Ultrasound;  Surgeon: [name], MD;  Location: [facility];  Service: Urology;  Laterality: Left;    Current Medications: Current Meds  Medication Sig  . ARTIFICIAL TEAR OINTMENT OP Place 1 application into both eyes daily as needed (For dry eyes.).  Marland Kitchen aspirin EC 81 MG tablet Take 81 mg by mouth daily.  Marland Kitchen b complex vitamins capsule Take 1  capsule by mouth daily.  . dorzolamide (TRUSOPT) 2 % ophthalmic solution Place 1 drop into both eyes 2 (two) times daily.   . dorzolamide-timolol (COSOPT) 22.3-6.8 MG/ML ophthalmic solution Place 1 drop into both eyes 2 (two) times daily.  . ferrous sulfate 325 (65 FE) MG tablet Take 1 tablet (325 mg total) by mouth daily.  . finasteride (PROSCAR) 5 MG tablet Take 5 mg by mouth every evening.   . hydrocortisone (ANUSOL-HC) 25 MG suppository Place 25 mg rectally daily as needed for hemorrhoids or anal itching.  . hydrocortisone 2.5 % cream Apply topically 2 (two) times daily.  . indomethacin (INDOCIN) 25 MG capsule Take 25 mg by mouth 2 (two) times daily with a meal.  . lanolin ointment Apply topically as needed (apply to eye).  Marland Kitchen latanoprost (XALATAN) 0.005 % ophthalmic solution Place 1 drop into both eyes at bedtime.  Marland Kitchen lisinopril (PRINIVIL,ZESTRIL) 20 MG tablet Take 20 mg by mouth every morning.   . metoprolol tartrate (LOPRESSOR) 50 MG tablet Take 1 pill (50 mg) 2 hours before your coronary CT  . Multiple Vitamins-Minerals (MULTIVITAMINS THER. W/MINERALS) TABS tablet Take 1 tablet by mouth daily.  . nitroGLYCERIN (NITROSTAT) 0.4 MG SL tablet Place 1 tablet (0.4 mg total) under the tongue every 5 (five) minutes as needed for chest pain.  . Omega-3 Fatty Acids (FISH OIL PO) Take 4 capsules by mouth daily.  Marland Kitchen omeprazole (PRILOSEC) 20 MG capsule Take 20 mg by mouth daily.  . rosuvastatin (CRESTOR) 10 MG tablet Take 1 tablet (10 mg total) by mouth daily.  . travoprost, benzalkonium, (TRAVATAN) 0.004 % ophthalmic solution Place 1 drop into both eyes at bedtime.   . triamcinolone cream (KENALOG) 0.1 % Apply 1 application topically 2 (two) times daily.     Allergies:   Patient has no known allergies.   Social History   Socioeconomic History  . Marital status: Divorced    Spouse name: Not on file  . Number of children: Not on file  . Years of education: Not on file  . Highest education level:  Not on file  Occupational History  . Occupation: Security guard  Tobacco Use  . Smoking status: Former Smoker    Packs/day: 1.00    Years: 20.00    Pack years: 20.00    Types: Cigarettes    Quit date: 01/19/1981    Years since quitting: 38.3  . Smokeless tobacco: Never Used  Substance and Sexual Activity  . Alcohol use: No  . Drug use: No  . 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 Not on file  Social History Narrative   Divorced   Employed as a Presenter, broadcasting   Former smoker but no current tobacco no alcohol or drug use   Social Determinants of Engineer, drilling  Resource Strain:   . Difficulty of Paying Living Expenses: Not on file  Food Insecurity:   . Worried About Charity fundraiser in the Last Year: Not on file  . Ran Out of Food in the Last Year: Not on file  Transportation Needs:   . Lack of Transportation (Medical): Not on file  . Lack of Transportation (Non-Medical): Not on file  Physical Activity:   . Days of Exercise per Week: Not on file  . Minutes of Exercise per Session: Not on file  Stress:   . Feeling of Stress : Not on file  Social Connections:   . Frequency of Communication with Friends and Family: Not on file  . Frequency of Social Gatherings with Friends and Family: Not on file  . Attends Religious Services: Not on file  . Active Member of Clubs or Organizations: Not on file  . Attends Archivist Meetings: Not on file  . Marital Status: Not on file     Family History: The patient's family history includes Hypertension in his father.  ROS:   Please see the history of present illness.     All other systems reviewed and are negative.  EKGs/Labs/Other Studies Reviewed:    The following studies were reviewed today:     Recent Labs: 11/04/2018: Hemoglobin 11.2; Platelets 247 05/03/2019: ALT 17; BUN 21; Creatinine, Ser  1.63; Potassium 4.6; Sodium 139  Recent Lipid Panel    Component Value Date/Time   CHOL 142 05/03/2019 0854   TRIG 75 05/03/2019 0854   HDL 51 05/03/2019 0854   CHOLHDL 2.8 05/03/2019 0854   LDLCALC 76 05/03/2019 0854    Physical Exam:    Physical Exam: Blood pressure 122/60, pulse (!) 51, height 6' 2.5" (1.892 m), weight 208 lb 8 oz (94.6 kg), SpO2 99 %.  GEN:  Well nourished, well developed in no acute distress HEENT: Normal NECK: No JVD; No carotid bruits LYMPHATICS: No lymphadenopathy CARDIAC: RRR , no murmurs, rubs, gallops RESPIRATORY:  Clear to auscultation without rales, wheezing or rhonchi  ABDOMEN: Soft, non-tender, non-distended MUSCULOSKELETAL:  No edema; No deformity  SKIN: Warm and dry NEUROLOGIC:  Alert and oriented x 3     EKG:    ASSESSMENT:    1. Pericardial effusion   2. Essential hypertension    PLAN:    In order of problems listed above:  1.  Chronic pericardial effusion.    Had a trivial effusion by last echo in Nov. 2020.     No follow up required at this time   2.  Mild coronary artery disease.  He has a coronary calcium score of 503 which is 79th percentile for age and gender matched controls.  There were no significant stenoses.  He does need to have an LDL of less than 70.  He is on Crestor 10 mg a day.  Will check lipids, liver enzymes, basic metabolic profile.  I will have him follow-up with his primary medical doctor.     Medication Adjustments/Labs and Tests Ordered: Current medicines are reviewed at length with the patient today.  Concerns regarding medicines are outlined above.  No orders of the defined types were placed in this encounter.  No orders of the defined types were placed in this encounter.    Patient Instructions  Medication Instructions:  Your physician recommends that you continue on your current medications as directed. Please refer to the Current Medication list given to you today.  *If you need a refill  on  your cardiac medications before your next appointment, please call your pharmacy*   Lab Work: TODAY - cholesterol, liver panel, basic metabolic panel If you have labs (blood work) drawn today and your tests are completely normal, you will receive your results only by: Marland Kitchen MyChart Message (if you have MyChart) OR . A paper copy in the mail If you have any lab test that is abnormal or we need to change your treatment, we will call you to review the results.   Testing/Procedures: None Ordered   Follow-Up: At [facility], you and your health needs are our priority.  As part of our continuing mission to provide you with exceptional heart care, we have created designated Provider Care Teams.  These Care Teams include your primary Cardiologist (physician) and Advanced Practice Providers (APPs -  Physician Assistants and Nurse Practitioners) who all work together to provide you with the care you need, when you need it.  We recommend signing up for the patient portal called "MyChart".  Sign up information is provided on this After Visit Summary.  MyChart is used to connect with patients for Virtual Visits (Telemedicine).  Patients are able to view lab/test results, encounter notes, upcoming appointments, etc.  Non-urgent messages can be sent to your provider as well.   To learn more about what you can do with MyChart, go to [email or web address].    Your next appointment:    As Needed  The format for your next appointment:   Either In Person or Virtual  Provider:   You may see Mertie Moores, MD or one of the following Advanced Practice Providers on your designated Care Team:    Richardson Dopp, PA-C  [place], [place]  Daune Perch, [place]        Signed, Mertie Moores, MD  05/03/2019 6:41 PM    [place]

## 2019-05-03 ENCOUNTER — Encounter: Payer: Self-pay | Admitting: Cardiovascular Disease

## 2019-05-03 ENCOUNTER — Ambulatory Visit: Payer: Medicare HMO | Admitting: Cardiovascular Disease

## 2019-05-03 ENCOUNTER — Other Ambulatory Visit: Payer: Self-pay

## 2019-05-03 ENCOUNTER — Other Ambulatory Visit: Payer: Medicare HMO

## 2019-05-03 VITALS — BP 122/60 | HR 51 | Ht 74.5 in | Wt 208.5 lb

## 2019-05-03 DIAGNOSIS — I10 Essential (primary) hypertension: Secondary | ICD-10-CM | POA: Diagnosis not present

## 2019-05-03 DIAGNOSIS — I3139 Other pericardial effusion (noninflammatory): Secondary | ICD-10-CM

## 2019-05-03 DIAGNOSIS — I25118 Atherosclerotic heart disease of native coronary artery with other forms of angina pectoris: Secondary | ICD-10-CM

## 2019-05-03 DIAGNOSIS — I313 Pericardial effusion (noninflammatory): Secondary | ICD-10-CM | POA: Diagnosis not present

## 2019-05-03 LAB — BASIC METABOLIC PANEL
BUN/Creatinine Ratio: 13 (ref 10–24)
BUN: 21 mg/dL (ref 8–27)
CO2: 22 mmol/L (ref 20–29)
Calcium: 9.5 mg/dL (ref 8.6–10.2)
Chloride: 104 mmol/L (ref 96–106)
Creatinine, Ser: 1.63 mg/dL — ABNORMAL HIGH (ref 0.76–1.27)
GFR calc Af Amer: 46 mL/min/{1.73_m2} — ABNORMAL LOW (ref >59)
GFR calc non Af Amer: 40 mL/min/{1.73_m2} — ABNORMAL LOW (ref >59)
Glucose: 97 mg/dL (ref 65–99)
Potassium: 4.6 mmol/L (ref 3.5–5.2)
Sodium: 139 mmol/L (ref 134–144)

## 2019-05-03 LAB — HEPATIC FUNCTION PANEL
ALT: 17 IU/L (ref 0–44)
AST: 16 IU/L (ref 0–40)
Albumin: 4.2 g/dL (ref 3.7–4.7)
Alkaline Phosphatase: 75 IU/L (ref 39–117)
Bilirubin Total: 0.2 mg/dL (ref 0.0–1.2)
Bilirubin, Direct: 0.09 mg/dL (ref 0.00–0.40)
Total Protein: 6.7 g/dL (ref 6.0–8.5)

## 2019-05-03 LAB — LIPID PANEL
Chol/HDL Ratio: 2.8 ratio (ref 0.0–5.0)
Cholesterol, Total: 142 mg/dL (ref 100–199)
HDL: 51 mg/dL (ref >39)
LDL Chol Calc (NIH): 76 mg/dL (ref 0–99)
Triglycerides: 75 mg/dL (ref 0–149)
VLDL Cholesterol Cal: 15 mg/dL (ref 5–40)

## 2019-05-03 NOTE — Patient Instructions (Signed)
Medication Instructions:  Your physician recommends that you continue on your current medications as directed. Please refer to the Current Medication list given to you today.  *If you need a refill on your cardiac medications before your next appointment, please call your pharmacy*   Lab Work: TODAY - cholesterol, liver panel, basic metabolic panel If you have labs (blood work) drawn today and your tests are completely normal, you will receive your results only by: Marland Kitchen MyChart Message (if you have MyChart) OR . A paper copy in the mail If you have any lab test that is abnormal or we need to change your treatment, we will call you to review the results.   Testing/Procedures: None Ordered   Follow-Up: At [facility], you and your health needs are our priority.  As part of our continuing mission to provide you with exceptional heart care, we have created designated Provider Care Teams.  These Care Teams include your primary Cardiologist (physician) and Advanced Practice Providers (APPs -  Physician Assistants and Nurse Practitioners) who all work together to provide you with the care you need, when you need it.  We recommend signing up for the patient portal called "MyChart".  Sign up information is provided on this After Visit Summary.  MyChart is used to connect with patients for Virtual Visits (Telemedicine).  Patients are able to view lab/test results, encounter notes, upcoming appointments, etc.  Non-urgent messages can be sent to your provider as well.   To learn more about what you can do with MyChart, go to [email or web address].    Your next appointment:    As Needed  The format for your next appointment:   Either In Person or Virtual  Provider:   You may see Mertie Moores, MD or one of the following Advanced Practice Providers on your designated Care Team:    Richardson Dopp, PA-C  [place], [place]  Daune Perch, [place]

## 2019-08-18 ENCOUNTER — Telehealth: Payer: Self-pay

## 2019-08-29 NOTE — Telephone Encounter (Signed)
Returned pt. call about confriming appt. date/time and office address. Left message to return call. Appt. reminder along with new office address sent via mail.

## 2019-09-01 ENCOUNTER — Other Ambulatory Visit: Payer: Self-pay

## 2019-09-01 DIAGNOSIS — R972 Elevated prostate specific antigen [PSA]: Secondary | ICD-10-CM

## 2019-09-02 LAB — PSA: PSA: 2.2 ng/mL (ref <=4.0)

## 2019-09-07 ENCOUNTER — Telehealth: Payer: Self-pay

## 2019-09-07 NOTE — Progress Notes (Signed)
Results mailed 

## 2019-09-07 NOTE — Telephone Encounter (Signed)
Pt called and notified

## 2019-09-07 NOTE — Telephone Encounter (Signed)
-----   Message from Franchot Gallo, MD sent at 09/06/2019  5:14 PM EDT ----- Notify patient that PSA 2.2, will discuss at upcoming visit ----- Message ----- From: Iris Pert, LPN Sent: 09/02/2771   8:10 AM EDT To: Franchot Gallo, MD  Please review

## 2019-09-27 ENCOUNTER — Ambulatory Visit: Payer: Medicare HMO | Admitting: Urology

## 2019-10-10 ENCOUNTER — Other Ambulatory Visit: Payer: Self-pay | Admitting: Hematology and Oncology

## 2019-10-10 DIAGNOSIS — C49A3 Gastrointestinal stromal tumor of small intestine: Secondary | ICD-10-CM

## 2019-10-10 DIAGNOSIS — C642 Malignant neoplasm of left kidney, except renal pelvis: Secondary | ICD-10-CM

## 2019-10-17 ENCOUNTER — Other Ambulatory Visit (HOSPITAL_COMMUNITY): Payer: Medicare HMO

## 2019-11-01 ENCOUNTER — Encounter: Payer: Self-pay | Admitting: Urology

## 2019-11-01 ENCOUNTER — Ambulatory Visit (INDEPENDENT_AMBULATORY_CARE_PROVIDER_SITE_OTHER): Payer: Medicare HMO | Admitting: Urology

## 2019-11-01 ENCOUNTER — Other Ambulatory Visit: Payer: Self-pay

## 2019-11-01 VITALS — BP 141/59 | HR 54 | Temp 98.0°F | Ht 74.5 in | Wt 215.0 lb

## 2019-11-01 DIAGNOSIS — R972 Elevated prostate specific antigen [PSA]: Secondary | ICD-10-CM

## 2019-11-01 LAB — URINALYSIS, ROUTINE W REFLEX MICROSCOPIC
Bilirubin, UA: NEGATIVE
Glucose, UA: NEGATIVE
Ketones, UA: NEGATIVE
Leukocytes,UA: NEGATIVE
Nitrite, UA: NEGATIVE
Protein,UA: NEGATIVE
Specific Gravity, UA: 1.02 (ref 1.005–1.030)
Urobilinogen, Ur: 0.2 mg/dL (ref 0.2–1.0)
pH, UA: 5.5 (ref 5.0–7.5)

## 2019-11-01 LAB — MICROSCOPIC EXAMINATION
Epithelial Cells (non renal): NONE SEEN /hpf (ref 0–10)
WBC, UA: NONE SEEN /hpf (ref 0–5)

## 2019-11-01 NOTE — Progress Notes (Signed)

## 2019-11-01 NOTE — Progress Notes (Signed)
H&P  Chief Complaint: Elevated PSA & ED  History of Present Illness: Mike Roach is a 79 year-old male established patient who is here for follow up for further evaluation of his elevated PSA.  8.31.2021: Pt here for annual f/u and submits no questions or complaints at this time. Pt continues on finasteride for BPH and prostaglandin injections for ED. Pt denies any recent hematuria or dysuria.  PSA:  7.1.2021 - 2.2 (4.4 corrected for finasteride)  IPSS Questionnaire (AUA-7): Over the past month.   1)  How often have you had a sensation of not emptying your bladder completely after you finish urinating?  2 - Less than half the time  2)  How often have you had to urinate again less than two hours after you finished urinating? 2 - Less than half the time  3)  How often have you found you stopped and started again several times when you urinated?  1 - Less than 1 time in 5  4) How difficult have you found it to postpone urination?  0 - Not at all  5) How often have you had a weak urinary stream?  0 - Not at all  6) How often have you had to push or strain to begin urination?  0 - Not at all  7) How many times did you most typically get up to urinate from the time you went to bed until the time you got up in the morning?  1 - 1 time  Total score:  0-7 mildly symptomatic   8-19 moderately symptomatic   20-35 severely symptomatic  QOL score: 2   (below copied from [place] records):  His last PSA was performed approximately 09/14/2018. The last PSA value was 2.5. The patient states he does take 5 alpha reductase inhibitor medication.   He has had a prostate biopsy done. The patient complains of lower urinary tract symptom(s) that include nocturia.   Prior h/o elevated PSA with negative BX x 3 previously most recently 2006 at time TRUS 81mL. PSA peak of 14.  2012 PSA 4.2 (avodart)  2017 PSA 4.4 (avodart) age 66  11.15.2017--3.0  11.12.2018--2.6   1.29.2019: No change in voiding sx's  --biggest issue is urgency. Still on finasteride   7.30.2019: Most recent PSA 2.4. Minimal LUTS   7.21.2020: Most recent PSA 2.5. He is currently on finasteride and doing well on this medication. He reports only minimal LUTS -- wakes up between 2-4 a night to urinate, has a fairly strong stream, and denies any major concerns with his urination.   CC/HPI: I have erectile dysfunction (Meds).  H/O progressive decline in ability to achieve erection. Most recently using prostaglandin injections, 30 g although he has forgotten how to inject.   2.26.2019--he is here today for reintroduction to prostaglandin injections.   7.30.2019: 30 mcg doses cause pain--he would like to decrease dose   7.21.2020: He reports mild satisfaction with injection use thus far, but has had some difficulties with sex still. He expresses that he wants to continue with this treatment.    Past Medical History:  Diagnosis Date  . Adenomatous colon polyp 09/21/2014  . Anemia in chronic illness 03/07/2015   being evaluated  . Arthritis   . Atrial flutter ([place])   . Bilateral hearing loss 03/15/2015  . Cancer of intestine ([place])   . Chronic kidney disease (CKD) 03/07/2015  . Chronic kidney disease, stage III (moderate) 03/07/2015  . Chronic right shoulder pain 02/03/2018  . GERD (gastroesophageal  reflux disease)   . GIST (gastrointestinal stroma tumor), malignant, colon ([place])    Duodenom,   . Glaucoma    both eyes  . Hearing impairment   . Hypercholesterolemia   . Hypertension   . Iron deficiency anemia 03/15/2015  . Kidney lesion, native, left 07/12/2015  . Lateral meniscus derangement 01/12/2013  . 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  . Osteoarthritis of right knee 01/12/2013  . Prostate enlargement   . Skin lesion of left leg 03/07/2015    Past Surgical History:  Procedure Laterality Date  . colonoscopy    . ESOPHAGOGASTRODUODENOSCOPY (EGD) WITH PROPOFOL N/A 06/08/2017   Procedure: ESOPHAGOGASTRODUODENOSCOPY  (EGD) WITH PROPOFOL;  Surgeon: [name], MD;  [facility];  Service: Endoscopy;  Laterality: N/A;  . EUS N/A 07/05/2015   Procedure: UPPER ENDOSCOPIC ULTRASOUND (EUS) LINEAR;  Surgeon: Milus Banister, MD;  [facility];  Service: Endoscopy;  Laterality: N/A;  . KNEE ARTHROSCOPY WITH LATERAL MENISECTOMY Right 01/21/2013   Procedure: KNEE ARTHROSCOPY WITH PARTIAL LATERAL MENISECTOMY;  Surgeon: Carole Civil, MD;  [facility];  Service: Orthopedics;  Laterality: Right;  . ORIF ANKLE DISLOCATION Right 15 yrs ago  . ROBOTIC ASSITED PARTIAL NEPHRECTOMY Left 10/17/2015   Procedure: XI ROBOTIC ASSITED LEFT PARTIAL NEPHRECTOMY, Left Cyst Decortication, Left Renal Ultrasound;  Surgeon: [name], MD;  [facility];  Service: Urology;  Laterality: Left;    Home Medications:  Allergies as of 11/01/2019   No Known Allergies     Medication List       Accurate as of November 01, 2019  1:10 PM. If you have any questions, ask your nurse or doctor.        ARTIFICIAL TEAR OINTMENT OP Place 1 application into both eyes daily as needed (For dry eyes.).   aspirin EC 81 MG tablet Take 81 mg by mouth daily.   b complex vitamins capsule Take 1 capsule by mouth daily.   dorzolamide 2 % ophthalmic solution Commonly known as: TRUSOPT Place 1 drop into both eyes 2 (two) times daily.   dorzolamide-timolol 22.3-6.8 MG/ML ophthalmic solution Commonly known as: COSOPT Place 1 drop into both eyes 2 (two) times daily.   ferrous sulfate 325 (65 FE) MG tablet Take 1 tablet (325 mg total) by mouth daily.   finasteride 5 MG tablet Commonly known as: PROSCAR Take 5 mg by mouth every evening.   FISH OIL PO Take 4 capsules by mouth daily.   hydrocortisone 2.5 % cream Apply topically 2 (two) times daily.   hydrocortisone 25 MG suppository Commonly known as: ANUSOL-HC Place 25 mg rectally daily as needed for hemorrhoids or anal itching.   indomethacin 25 MG  capsule Commonly known as: INDOCIN Take 25 mg by mouth 2 (two) times daily with a meal.   lanolin ointment Apply topically as needed (apply to eye).   latanoprost 0.005 % ophthalmic solution Commonly known as: XALATAN Place 1 drop into both eyes at bedtime.   lisinopril 20 MG tablet Commonly known as: ZESTRIL Take 20 mg by mouth every morning.   metoprolol tartrate 50 MG tablet Commonly known as: LOPRESSOR Take 1 pill (50 mg) 2 hours before your coronary CT   multivitamins ther. w/minerals Tabs tablet Take 1 tablet by mouth daily.   nitroGLYCERIN 0.4 MG SL tablet Commonly known as: NITROSTAT Place 1 tablet (0.4 mg total) under the tongue every 5 (five) minutes as needed for chest pain.   omeprazole 20 MG capsule Commonly known as:  PRILOSEC Take 20 mg by mouth daily.   rosuvastatin 10 MG tablet Commonly known as: CRESTOR Take 1 tablet (10 mg total) by mouth daily.   travoprost (benzalkonium) 0.004 % ophthalmic solution Commonly known as: TRAVATAN Place 1 drop into both eyes at bedtime.   triamcinolone cream 0.1 % Commonly known as: KENALOG Apply 1 application topically 2 (two) times daily.       Allergies: No Known Allergies  Family History  Problem Relation Age of Onset  . Hypertension Father     Social History:  reports that he quit smoking about 38 years ago. His smoking use included cigarettes. He has a 20.00 pack-year smoking history. He has never used smokeless tobacco. He reports that he does not drink alcohol and does not use drugs.  ROS: Urological Symptom Review  Patient is experiencing the following symptoms: none   Review of Systems  Gastrointestinal (upper)  : Negative for upper GI symptoms  Gastrointestinal (lower) : Negative for lower GI symptoms  Constitutional : Negative for symptoms  Skin: Negative for skin symptoms  Eyes: Negative for eye symptoms  Ear/Nose/Throat : Negative for Ear/Nose/Throat  symptoms  Hematologic/Lymphatic: Negative for Hematologic/Lymphatic symptoms  Cardiovascular : Negative for cardiovascular symptoms  Respiratory : Negative for respiratory symptoms  Endocrine: Negative for endocrine symptoms  Musculoskeletal: Negative for musculoskeletal symptoms  Neurological: Negative for neurological symptoms  Psychologic: Negative for psychiatric symptoms  Physical Exam:  Vital signs in last 24 hours: There were no vitals taken for this visit. Constitutional:  Alert and oriented, No acute distress Cardiovascular: Regular rate  Respiratory: Normal respiratory effort GI: Abdomen is soft, nontender, nondistended, no abdominal masses. No CVAT. No hernia. Genitourinary: Normal male phallus, right testicle absent, left testicle are descended and non-tender and without masses, scrotum is normal in appearance without lesions or masses, perineum is normal on inspection. Prostate feels about 50 grams Neurologic: Grossly intact, no focal deficits Psychiatric: Normal mood and affect  I have reviewed prior pt notes  I have reviewed notes from referring/previous physicians  I have reviewed urinalysis results  I have independently reviewed prior imaging  I have reviewed prior PSA results  Impression/Assessment:  History of elevated PSA with negative biopsies, PSA lower/DRE normal  BPH with minimal symptoms on finasteride  ED, on alprostadil, doing well although he does not use this regularly.    Plan:  1. Pt continued on finasteride and prostaglandin  2. F/U in 1 year for OV and PSA.  CC: Dr. Huel Cote

## 2019-11-09 ENCOUNTER — Inpatient Hospital Stay: Payer: Medicare HMO | Attending: Hematology and Oncology

## 2019-11-09 ENCOUNTER — Other Ambulatory Visit: Payer: Self-pay | Admitting: *Deleted

## 2019-11-09 ENCOUNTER — Encounter (HOSPITAL_COMMUNITY): Payer: Self-pay

## 2019-11-09 ENCOUNTER — Other Ambulatory Visit: Payer: Self-pay

## 2019-11-09 ENCOUNTER — Other Ambulatory Visit: Payer: Medicare HMO

## 2019-11-09 ENCOUNTER — Ambulatory Visit (HOSPITAL_COMMUNITY)
Admission: RE | Admit: 2019-11-09 | Discharge: 2019-11-09 | Disposition: A | Discharge status: Home / Self Care | Payer: Medicare HMO | Source: Ambulatory Visit | Attending: Hematology and Oncology | Admitting: Hematology and Oncology

## 2019-11-09 DIAGNOSIS — C642 Malignant neoplasm of left kidney, except renal pelvis: Secondary | ICD-10-CM

## 2019-11-09 DIAGNOSIS — C49A3 Gastrointestinal stromal tumor of small intestine: Secondary | ICD-10-CM | POA: Diagnosis present

## 2019-11-09 DIAGNOSIS — N183 Chronic kidney disease, stage 3 unspecified: Secondary | ICD-10-CM | POA: Diagnosis not present

## 2019-11-09 DIAGNOSIS — D638 Anemia in other chronic diseases classified elsewhere: Secondary | ICD-10-CM | POA: Insufficient documentation

## 2019-11-09 DIAGNOSIS — Z85528 Personal history of other malignant neoplasm of kidney: Secondary | ICD-10-CM | POA: Diagnosis not present

## 2019-11-09 DIAGNOSIS — R93421 Abnormal radiologic findings on diagnostic imaging of right kidney: Secondary | ICD-10-CM | POA: Diagnosis present

## 2019-11-09 DIAGNOSIS — Z8509 Personal history of malignant neoplasm of other digestive organs: Secondary | ICD-10-CM | POA: Diagnosis not present

## 2019-11-09 HISTORY — DX: Malignant neoplasm of left kidney, except renal pelvis: C64.2

## 2019-11-09 LAB — COMPREHENSIVE METABOLIC PANEL
ALT: 13 U/L (ref 0–44)
AST: 18 U/L (ref 15–41)
Albumin: 3.8 g/dL (ref 3.5–5.0)
Alkaline Phosphatase: 64 U/L (ref 38–126)
Anion gap: 4 — ABNORMAL LOW (ref 5–15)
BUN: 21 mg/dL (ref 8–23)
CO2: 26 mmol/L (ref 22–32)
Calcium: 9.6 mg/dL (ref 8.9–10.3)
Chloride: 109 mmol/L (ref 98–111)
Creatinine, Ser: 1.55 mg/dL — ABNORMAL HIGH (ref 0.61–1.24)
GFR calc Af Amer: 49 mL/min — ABNORMAL LOW (ref >60)
GFR calc non Af Amer: 42 mL/min — ABNORMAL LOW (ref >60)
Glucose, Bld: 76 mg/dL (ref 70–99)
Potassium: 4.9 mmol/L (ref 3.5–5.1)
Sodium: 139 mmol/L (ref 135–145)
Total Bilirubin: 0.5 mg/dL (ref 0.3–1.2)
Total Protein: 7.3 g/dL (ref 6.5–8.1)

## 2019-11-09 LAB — CBC WITH DIFFERENTIAL/PLATELET
Abs Immature Granulocytes: 0.01 10*3/uL (ref 0.00–0.07)
Basophils Absolute: 0 10*3/uL (ref 0.0–0.1)
Basophils Relative: 1 %
Eosinophils Absolute: 0.2 10*3/uL (ref 0.0–0.5)
Eosinophils Relative: 5 %
HCT: 33.2 % — ABNORMAL LOW (ref 39.0–52.0)
Hemoglobin: 11 g/dL — ABNORMAL LOW (ref 13.0–17.0)
Immature Granulocytes: 0 %
Lymphocytes Relative: 26 %
Lymphs Abs: 1.3 10*3/uL (ref 0.7–4.0)
MCH: 32.9 pg (ref 26.0–34.0)
MCHC: 33.1 g/dL (ref 30.0–36.0)
MCV: 99.4 fL (ref 80.0–100.0)
Monocytes Absolute: 0.6 10*3/uL (ref 0.1–1.0)
Monocytes Relative: 11 %
Neutro Abs: 2.8 10*3/uL (ref 1.7–7.7)
Neutrophils Relative %: 57 %
Platelets: 232 10*3/uL (ref 150–400)
RBC: 3.34 MIL/uL — ABNORMAL LOW (ref 4.22–5.81)
RDW: 13.9 % (ref 11.5–15.5)
WBC: 4.9 10*3/uL (ref 4.0–10.5)
nRBC: 0 % (ref 0.0–0.2)

## 2019-11-09 LAB — IRON AND TIBC
Iron: 66 ug/dL (ref 42–163)
Saturation Ratios: 26 % (ref 20–55)
TIBC: 256 ug/dL (ref 202–409)
UIBC: 190 ug/dL (ref 117–376)

## 2019-11-09 LAB — FERRITIN: Ferritin: 183 ng/mL (ref 24–336)

## 2019-11-09 MED ORDER — IOHEXOL 300 MG/ML  SOLN
100.0000 mL | Freq: Once | INTRAMUSCULAR | Status: AC | PRN
  Administered 2019-11-09: 100 mL via INTRAVENOUS

## 2019-11-10 ENCOUNTER — Other Ambulatory Visit: Payer: Self-pay

## 2019-11-10 ENCOUNTER — Encounter: Payer: Self-pay | Admitting: Hematology and Oncology

## 2019-11-10 ENCOUNTER — Inpatient Hospital Stay: Payer: Medicare HMO | Admitting: Hematology and Oncology

## 2019-11-10 VITALS — BP 116/69 | HR 70 | Temp 97.7°F | Resp 18 | Ht 74.5 in | Wt 194.7 lb

## 2019-11-10 DIAGNOSIS — C642 Malignant neoplasm of left kidney, except renal pelvis: Secondary | ICD-10-CM

## 2019-11-10 DIAGNOSIS — D638 Anemia in other chronic diseases classified elsewhere: Secondary | ICD-10-CM | POA: Diagnosis not present

## 2019-11-10 DIAGNOSIS — Z85528 Personal history of other malignant neoplasm of kidney: Secondary | ICD-10-CM | POA: Diagnosis not present

## 2019-11-10 DIAGNOSIS — C49A3 Gastrointestinal stromal tumor of small intestine: Secondary | ICD-10-CM

## 2019-11-10 DIAGNOSIS — N1831 Chronic kidney disease, stage 3a: Secondary | ICD-10-CM

## 2019-11-10 NOTE — Progress Notes (Signed)
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/17/2015  CT imaging showed no evidence of disease within the stomach region He is not anemic Observe only for now from the GIST standpoint  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 He has history of iron deficiency but repeat iron studies are adequate His anemia is stable. He does not require any further workup or treatment now.   Orders Placed This Encounter  Procedures  . MR ABDOMEN W WO CONTRAST    Standing Status:   Future    Standing Expiration Date:   11/09/2020    Order Specific Question:   If indicated for the ordered procedure, I authorize the administration of contrast media per Radiology protocol    Answer:   Yes    Order Specific Question:   What is the patient's sedation requirement?    Answer:   No  Sedation    Order Specific Question:   Does the patient have a pacemaker or implanted devices?    Answer:   No    Order Specific Question:   Radiology Contrast Protocol - do NOT remove file path    Answer:   [email or web address]    Order Specific Question:   Preferred imaging location?    Answer:   [facility] (table limit - 550 lbs)    All questions were answered. The patient knows to call the clinic with any problems, questions or concerns. The total time spent in the appointment was 30 minutes encounter with patients including review of chart and various tests results, discussions about plan of care and coordination of care plan   [name], MD 11/10/2019 9:13 AM  INTERVAL HISTORY: Please see below for problem oriented charting. 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    06/29/2015  Imaging   MRI abdomen showed 1.9 cm enhancing lesion along the lateral left lower kidney, corresponding to the CT abnormality, suspicious for solid renal neoplasm such as renal cell carcinoma   07/05/2015 Procedure   EUS confirmed 2.6 cm mass opposite opening of the ampulla   07/05/2015 Pathology Results   Accession: [id number] FNA was  positive for GIST   10/17/2015 Surgery   He underwent combined stomach resection and kidney resection   10/17/2015 Pathology Results   Accession: [id number] pathology from stomach revealed 3.5 cm GIST with low mitotic rate but positive margin. He also has papillary cell carcinoma involving the kidney   10/16/2016 Imaging   1. Status post resection of a duodenum GIST. No findings for residual or recurrent tumor or metastatic disease. 2. Status post partial left nephrectomy for small renal cell carcinoma. No findings for recurrent tumor or metastatic disease. 3. Numerous bilateral renal cysts, some of which are hyperdense/hemorrhagic. No worrisome renal lesions. 4. Markedly enlarged prostate gland. 5. Small pericardial effusion and scattered mediastinal lymph nodes but no mass or overt adenopathy. 6. Benign-appearing hepatic cysts.   02/05/2017 Imaging   Ct scan of abdomen and pelvis showed no evidence of recurrent kidney cancer or GIST tumor. Probable small bowel enteritis   06/08/2017 Pathology Results   Duodenum, Biopsy - PEPTIC DUODENITIS - NO DYSPLASIA OR MALIGNANCY IDENTIFIED   06/08/2017 Procedure   There was evidence of a patent previous surgical anastomosis in the first portion of the duodenum. This was characterized by healthy appearing mucosa. Biopsies were taken with a cold forceps for histology. Verification of patient identification for the specimen was done. Estimated blood loss was minimal. Findings: A non-obstructing Schatzki ring was found at the gastroesophageal junction. A small hiatal hernia was present. The cardia and gastric fundus were normal on retroflexion. The exam was otherwise without abnormality. - Patent previous surgical anastomosis, characterized by healthy appearing mucosa was found in the duodenum. Biopsied. Was not photogtraphed - Non-obstructing Schatzki ring. - Small hiatal hernia. - The examination was otherwise   11/04/2018 Imaging   1. No evidence  of recurrent tumor in the periampullary duodenum or in the partial nephrectomy bed in the lower left kidney on this noncontrast scan. 2. No findings of metastatic disease in the abdomen or pelvis on this noncontrast scan. 3. Simple and hyperdense renal cortical lesions in both kidneys, not substantially changed, previously characterized as Bosniak category 1 and 2 renal cysts on MRI. 4. Marked prostatomegaly. 5.  Aortic Atherosclerosis (ICD10-I70.0).     11/09/2019 Imaging   1. Postoperative changes of LEFT partial nephrectomy without signs of disease recurrence in this area. 2. There is a lesion in the lower-interpolar portion of the RIGHT kidney extending to the hilar line and just beyond that that measures approximately 1.8 x 1.9 cm previously approximately 1.4 x 1.1 cm. This could represent a small papillary renal neoplasm, MRI may be helpful for further assessment. 3. No signs of periampullary disease recurrence to the extent evaluated on CT. 4. Bilateral renal cysts some with proteinaceous and/or hemorrhagic features, otherwise not significantly changed. 5. Colonic diverticulosis. 6. Aortic atherosclerosis.   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 Postoperative changes of LEFT partial nephrectomy without signs of disease recurrence in this area. 2.  There is a lesion in the lower-interpolar portion of the RIGHT kidney extending to the hilar line and just beyond that that measures approximately 1.8 x 1.9 cm previously approximately 1.4 x 1.1 cm. This could represent a small papillary renal neoplasm, MRI may be helpful for further assessment. 3. No signs of periampullary disease recurrence to the extent evaluated on CT. 4. Bilateral renal  cysts some with proteinaceous and/or hemorrhagic features, otherwise not significantly changed. 5. Colonic diverticulosis. 6. Aortic atherosclerosis.     REVIEW OF SYSTEMS:   Constitutional: Denies fevers, chills or abnormal weight loss Eyes: Denies blurriness of vision Ears, nose, mouth, throat, and face: Denies mucositis or sore throat Respiratory: Denies cough, dyspnea or wheezes Cardiovascular: Denies palpitation, chest discomfort or lower extremity swelling Gastrointestinal:  Denies nausea, heartburn or change in bowel habits Skin: Denies abnormal skin rashes Lymphatics: Denies new lymphadenopathy or easy bruising Neurological:Denies numbness, tingling or new weaknesses Behavioral/Psych: Mood is stable, no new changes  All other systems were reviewed with the patient and are negative.  I have reviewed the past medical history, past surgical history, social history and family history with the patient and they are unchanged from previous note.  ALLERGIES:  has No Known Allergies.  MEDICATIONS:  Current Outpatient Medications  Medication Sig Dispense Refill  . ARTIFICIAL TEAR OINTMENT OP Place 1 application into both eyes daily as needed (For dry eyes.).     Marland Kitchen aspirin EC 81 MG tablet Take 81 mg by mouth daily.     Marland Kitchen b complex vitamins capsule Take 1 capsule by mouth daily.     . diclofenac Sodium (VOLTAREN) 1 % GEL SMARTSIG:Sparingly Topical 3 Times Daily PRN    . dorzolamide (TRUSOPT) 2 % ophthalmic solution Place 1 drop into both eyes 2 (two) times daily.     . dorzolamide-timolol (COSOPT) 22.3-6.8 MG/ML ophthalmic solution Place 1 drop into both eyes 2 (two) times daily.     . ferrous sulfate 325 (65 FE) MG tablet Take 1 tablet (325 mg total) by mouth daily. (Patient not taking: Reported on 11/01/2019) 90 tablet 3  . finasteride (PROSCAR) 5 MG tablet Take 5 mg by mouth every evening.   3  . hydrocortisone (ANUSOL-HC) 25 MG suppository Place 25 mg rectally daily as needed for  hemorrhoids or anal itching.     . hydrocortisone 2.5 % cream Apply topically 2 (two) times daily.     . indomethacin (INDOCIN) 25 MG capsule Take 25 mg by mouth 2 (two) times daily with a meal. (Patient not taking: Reported on 11/01/2019)    . lanolin ointment Apply topically as needed (apply to eye).     Marland Kitchen latanoprost (XALATAN) 0.005 % ophthalmic solution Place 1 drop into both eyes at bedtime.     Marland Kitchen lisinopril (PRINIVIL,ZESTRIL) 20 MG tablet Take 20 mg by mouth every morning.     . metoprolol tartrate (LOPRESSOR) 50 MG tablet Take 1 pill (50 mg) 2 hours before your coronary CT (Patient not taking: Reported on 11/01/2019) 1 tablet 0  . Multiple Vitamins-Minerals (MULTIVITAMINS THER. W/MINERALS) TABS tablet Take 1 tablet by mouth daily.     . nitroGLYCERIN (NITROSTAT) 0.4 MG SL tablet Place 1 tablet (0.4 mg total) under the tongue every 5 (five) minutes as needed for chest pain. (Patient not taking: Reported on 11/01/2019) 25 tablet 3  . Omega-3 Fatty Acids (FISH OIL PO) Take 4 capsules by mouth daily.     Marland Kitchen omeprazole (PRILOSEC) 20 MG capsule Take 20 mg by  mouth daily.     . rosuvastatin (CRESTOR) 10 MG tablet Take 1 tablet (10 mg total) by mouth daily. 90 tablet 3  . travoprost, benzalkonium, (TRAVATAN) 0.004 % ophthalmic solution Place 1 drop into both eyes at bedtime.     . triamcinolone cream (KENALOG) 0.1 % Apply 1 application topically 2 (two) times daily.      No current facility-administered medications for this visit.    PHYSICAL EXAMINATION: ECOG PERFORMANCE STATUS: 1 - Symptomatic but completely ambulatory  Vitals:   11/10/19 0803  BP: 116/69  Pulse: 70  Resp: 18  Temp: 97.7 F (36.5 C)  SpO2: 100%   Filed Weights   11/10/19 0803  Weight: 194 lb 11.2 oz (88.3 kg)    GENERAL:alert, no distress and comfortable  NEURO: alert & oriented x 3 with fluent speech, no focal motor/sensory deficits  LABORATORY DATA:  I have reviewed the data as listed    Component Value  Date/Time   NA 139 11/09/2019 1009   NA 139 05/03/2019 0854   NA 140 10/16/2016 0816   K 4.9 11/09/2019 1009   K 4.1 10/16/2016 0816   CL 109 11/09/2019 1009   CO2 26 11/09/2019 1009   CO2 24 10/16/2016 0816   GLUCOSE 76 11/09/2019 1009   GLUCOSE 96 10/16/2016 0816   BUN 21 11/09/2019 1009   BUN 21 05/03/2019 0854   BUN 17.4 10/16/2016 0816   CREATININE 1.55 (H) 11/09/2019 1009   CREATININE 1.8 (H) 10/16/2016 0816   CALCIUM 9.6 11/09/2019 1009   CALCIUM 9.6 10/16/2016 0816   PROT 7.3 11/09/2019 1009   PROT 6.7 05/03/2019 0854   PROT 7.2 10/16/2016 0816   ALBUMIN 3.8 11/09/2019 1009   ALBUMIN 4.2 05/03/2019 0854   ALBUMIN 3.5 10/16/2016 0816   AST 18 11/09/2019 1009   AST 20 10/16/2016 0816   ALT 13 11/09/2019 1009   ALT 16 10/16/2016 0816   ALKPHOS 64 11/09/2019 1009   ALKPHOS 69 10/16/2016 0816   BILITOT 0.5 11/09/2019 1009   BILITOT 0.2 05/03/2019 0854   BILITOT 0.44 10/16/2016 0816   GFRNONAA 42 (L) 11/09/2019 1009   GFRAA 49 (L) 11/09/2019 1009    No results found for: SPEP, UPEP  Lab Results  Component Value Date   WBC 4.9 11/09/2019   NEUTROABS 2.8 11/09/2019   HGB 11.0 (L) 11/09/2019   HCT 33.2 (L) 11/09/2019   MCV 99.4 11/09/2019   PLT 232 11/09/2019      Chemistry      Component Value Date/Time   NA 139 11/09/2019 1009   NA 139 05/03/2019 0854   NA 140 10/16/2016 0816   K 4.9 11/09/2019 1009   K 4.1 10/16/2016 0816   CL 109 11/09/2019 1009   CO2 26 11/09/2019 1009   CO2 24 10/16/2016 0816   BUN 21 11/09/2019 1009   BUN 21 05/03/2019 0854   BUN 17.4 10/16/2016 0816   CREATININE 1.55 (H) 11/09/2019 1009   CREATININE 1.8 (H) 10/16/2016 0816      Component Value Date/Time   CALCIUM 9.6 11/09/2019 1009   CALCIUM 9.6 10/16/2016 0816   ALKPHOS 64 11/09/2019 1009   ALKPHOS 69 10/16/2016 0816   AST 18 11/09/2019 1009   AST 20 10/16/2016 0816   ALT 13 11/09/2019 1009   ALT 16 10/16/2016 0816   BILITOT 0.5 11/09/2019 1009   BILITOT 0.2  05/03/2019 0854   BILITOT 0.44 10/16/2016 0816       RADIOGRAPHIC STUDIES: I have  reviewed multiple imaging studies with the patient I have personally reviewed the radiological images as listed and agreed with the findings in the report. CT Abdomen Pelvis W Wo Contrast  Result Date: 11/09/2019 CLINICAL DATA:  Urologic cancer, staging post resection of duodenal tumor and papillary renal cell carcinoma EXAM: CT ABDOMEN AND PELVIS WITHOUT AND WITH CONTRAST TECHNIQUE: Multidetector CT imaging of the abdomen and pelvis was performed following the standard protocol before and following the bolus administration of intravenous contrast. CONTRAST:  150mL OMNIPAQUE IOHEXOL 300 MG/ML  SOLN COMPARISON:  November 04, 2018 FINDINGS: Lower chest: Small pericardial effusion is similar to previous imaging. Hepatobiliary: Portal vein is patent. Hepatic cysts are stable. Hepatic veins are patent. No pericholecystic stranding. No biliary duct dilation. Pancreas: Pancreas without focal lesion, ductal dilation or signs of inflammation. Spleen: Spleen normal in size and contour, unchanged from previous exam. Adrenals/Urinary Tract: Adrenal glands are normal. Bilateral renal cysts some with proteinaceous and/or hemorrhagic features similar to prior imaging. No hydronephrosis. No nephrolithiasis. There is however a lesion in the lower-interpolar portion of the RIGHT kidney extending to the hilar line and just beyond that measures approximately 21 Hounsfield units at baseline and approximately 41 Hounsfield units post-contrast on late venous phase this measures approximately 1.8 x 1.9 cm previously approximately 1.4 x 1.1 cm. Postoperative changes of LEFT partial nephrectomy. No signs of disease recurrence in this area. Stomach/Bowel: Colonic diverticulosis. No diverticulitis. Normal appendix. Stomach under distended limiting assessment. Small bowel of normal caliber without perienteric stranding or acute small bowel process.  Vascular/Lymphatic: Calcified atheromatous plaque and noncalcified plaque in the abdominal aorta without aneurysmal dilation. There is no gastrohepatic or hepatoduodenal ligament lymphadenopathy. No retroperitoneal or mesenteric lymphadenopathy. No pelvic lymphadenopathy. Reproductive: Marked heterogeneity of an enlarged prostate shows no change. Nonspecific finding on CT. Other: No ascites. Musculoskeletal: No acute musculoskeletal findings. Spinal degenerative changes. IMPRESSION: 1. Postoperative changes of LEFT partial nephrectomy without signs of disease recurrence in this area. 2. There is a lesion in the lower-interpolar portion of the RIGHT kidney extending to the hilar line and just beyond that that measures approximately 1.8 x 1.9 cm previously approximately 1.4 x 1.1 cm. This could represent a small papillary renal neoplasm, MRI may be helpful for further assessment. 3. No signs of periampullary disease recurrence to the extent evaluated on CT. 4. Bilateral renal cysts some with proteinaceous and/or hemorrhagic features, otherwise not significantly changed. 5. Colonic diverticulosis. 6. Aortic atherosclerosis. These results will be called to the ordering clinician or representative by the Radiologist Assistant, and communication documented in the PACS or [employer]. Aortic Atherosclerosis (ICD10-I70.0). Electronically Signed   By: Zetta Bills M.D.   On: 11/09/2019 13:49

## 2019-11-10 NOTE — Assessment & Plan Note (Signed)
I have reviewed multiple imaging studies extensively He had multiple cysts on both kidneys Per recommendation by radiologist, I will order MRI of the abdomen with and without contrast to evaluate further I will also consult with his urologist for further follow-up If MRI showed suspicious concern for kidney cancer, he might need further surgery I will call his urologist to discuss this

## 2019-11-10 NOTE — Assessment & Plan Note (Signed)
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 He has history of iron deficiency but repeat iron studies are adequate His anemia is stable. He does not require any further workup or treatment now.

## 2019-11-10 NOTE — Assessment & Plan Note (Signed)
CT imaging showed no evidence of disease within the stomach region He is not anemic Observe only for now from the GIST standpoint

## 2019-11-10 NOTE — Assessment & Plan Note (Signed)
He has stable chronic kidney disease stage III since nephrectomy

## 2019-11-17 ENCOUNTER — Telehealth: Payer: Self-pay

## 2019-11-17 ENCOUNTER — Encounter: Payer: Self-pay | Admitting: Hematology and Oncology

## 2019-11-17 NOTE — Telephone Encounter (Signed)
Called and left a message asking him to call back to schedule the below appt.

## 2019-11-17 NOTE — Telephone Encounter (Signed)
He called back.and he is unable to take the below appt. Scheduled appt for 9/28 at 1200. He is aware of the time.

## 2019-11-17 NOTE — Telephone Encounter (Signed)
This encounter was created in error - please disregard.

## 2019-11-17 NOTE — Telephone Encounter (Signed)
-----   Message from [name], MD sent at 11/17/2019 11:58 AM EDT ----- Regarding: appt on 9/23 Since his appt wioth Dr. Tresa Moore is not until October, I can see him on 9/23 at 230 pm to review the scan, 30 mins appt

## 2019-11-17 NOTE — Telephone Encounter (Signed)
Called and told MRI is approved thru insurance now and to keep appt as scheduled. He verbalized understanding. He has appt scheduled with Dr. Tresa Moore for October 19.

## 2019-11-18 ENCOUNTER — Ambulatory Visit (HOSPITAL_COMMUNITY): Payer: Medicare HMO

## 2019-11-22 ENCOUNTER — Ambulatory Visit (HOSPITAL_COMMUNITY)
Admission: RE | Admit: 2019-11-22 | Discharge: 2019-11-22 | Disposition: A | Discharge status: Home / Self Care | Payer: Medicare HMO | Source: Ambulatory Visit | Attending: Hematology and Oncology | Admitting: Hematology and Oncology

## 2019-11-22 ENCOUNTER — Other Ambulatory Visit: Payer: Self-pay

## 2019-11-22 DIAGNOSIS — C642 Malignant neoplasm of left kidney, except renal pelvis: Secondary | ICD-10-CM | POA: Diagnosis not present

## 2019-11-22 MED ORDER — GADOBUTROL 1 MMOL/ML IV SOLN
8.0000 mL | Freq: Once | INTRAVENOUS | Status: AC | PRN
  Administered 2019-11-22: 8 mL via INTRAVENOUS

## 2019-11-29 ENCOUNTER — Inpatient Hospital Stay: Payer: Medicare HMO | Admitting: Hematology and Oncology

## 2019-11-29 ENCOUNTER — Encounter: Payer: Self-pay | Admitting: Hematology and Oncology

## 2019-11-29 ENCOUNTER — Other Ambulatory Visit: Payer: Self-pay

## 2019-11-29 DIAGNOSIS — Z85528 Personal history of other malignant neoplasm of kidney: Secondary | ICD-10-CM | POA: Diagnosis not present

## 2019-11-29 DIAGNOSIS — C642 Malignant neoplasm of left kidney, except renal pelvis: Secondary | ICD-10-CM

## 2019-11-29 DIAGNOSIS — C49A3 Gastrointestinal stromal tumor of small intestine: Secondary | ICD-10-CM | POA: Diagnosis not present

## 2019-11-29 NOTE — Progress Notes (Signed)
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 I have reviewed recent MRI findings with the patient He is not symptomatic Denies flank pain no hematuria The imaging study review a lesion on the right kidney 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   All questions were answered. The patient knows to call the clinic with any problems, questions or concerns. The total time spent in the appointment was 20 minutes encounter with patients including review of chart and various tests results, discussions about plan of care and coordination of care plan   [name], MD 11/29/2019 3:10 PM  INTERVAL HISTORY: Please see below for problem oriented charting. 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    06/29/2015 Imaging   MRI abdomen showed 1.9 cm enhancing lesion along the lateral left lower kidney, corresponding to the CT abnormality, suspicious for solid renal neoplasm such as renal cell carcinoma   07/05/2015 Procedure   EUS confirmed 2.6 cm mass opposite opening of the ampulla   07/05/2015 Pathology Results   Accession: [id number] FNA was positive for GIST   10/17/2015 Surgery   He underwent combined stomach resection and kidney resection   10/17/2015 Pathology Results   Accession: [id number] pathology from stomach revealed 3.5 cm GIST with low mitotic rate but positive margin. He also has papillary cell carcinoma involving the kidney   10/16/2016 Imaging   1. Status post resection of a duodenum GIST. No findings for residual or recurrent tumor or metastatic disease. 2. Status post partial left nephrectomy for small renal cell carcinoma. No findings for recurrent tumor or metastatic disease. 3. Numerous bilateral renal cysts, some of which are hyperdense/hemorrhagic. No worrisome renal lesions. 4. Markedly enlarged prostate gland. 5. Small pericardial effusion and scattered mediastinal lymph nodes but no mass or overt adenopathy. 6. Benign-appearing hepatic cysts.   02/05/2017 Imaging   Ct scan of abdomen and pelvis showed no evidence of recurrent kidney cancer or GIST tumor. Probable  small bowel enteritis   06/08/2017 Pathology Results   Duodenum, Biopsy - PEPTIC DUODENITIS - NO DYSPLASIA OR MALIGNANCY IDENTIFIED   06/08/2017  Procedure   There was evidence of a patent previous surgical anastomosis in the first portion of the duodenum. This was characterized by healthy appearing mucosa. Biopsies were taken with a cold forceps for histology. Verification of patient identification for the specimen was done. Estimated blood loss was minimal. Findings: A non-obstructing Schatzki ring was found at the gastroesophageal junction. A small hiatal hernia was present. The cardia and gastric fundus were normal on retroflexion. The exam was otherwise without abnormality. - Patent previous surgical anastomosis, characterized by healthy appearing mucosa was found in the duodenum. Biopsied. Was not photogtraphed - Non-obstructing Schatzki ring. - Small hiatal hernia. - The examination was otherwise   11/04/2018 Imaging   1. No evidence of recurrent tumor in the periampullary duodenum or in the partial nephrectomy bed in the lower left kidney on this noncontrast scan. 2. No findings of metastatic disease in the abdomen or pelvis on this noncontrast scan. 3. Simple and hyperdense renal cortical lesions in both kidneys, not substantially changed, previously characterized as Bosniak category 1 and 2 renal cysts on MRI. 4. Marked prostatomegaly. 5.  Aortic Atherosclerosis (ICD10-I70.0).     11/09/2019 Imaging   1. Postoperative changes of LEFT partial nephrectomy without signs of disease recurrence in this area. 2. There is a lesion in the lower-interpolar portion of the RIGHT kidney extending to the hilar line and just beyond that that measures approximately 1.8 x 1.9 cm previously approximately 1.4 x 1.1 cm. This could represent a small papillary renal neoplasm, MRI may be helpful for further assessment. 3. No signs of periampullary disease recurrence to the extent evaluated on CT. 4. Bilateral renal cysts some with proteinaceous and/or hemorrhagic features, otherwise not significantly changed. 5. Colonic diverticulosis. 6. Aortic  atherosclerosis.   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 Postoperative changes of LEFT partial nephrectomy without signs of disease recurrence in this area. 2. There is a lesion in the lower-interpolar portion of the RIGHT kidney extending to the hilar line and just beyond that that measures approximately 1.8 x 1.9 cm previously approximately 1.4 x 1.1 cm. This could represent a small papillary renal neoplasm, MRI may be helpful for further assessment. 3. No signs of periampullary disease recurrence to the extent evaluated on CT. 4. Bilateral renal cysts some with proteinaceous and/or hemorrhagic features, otherwise not significantly changed. 5. Colonic diverticulosis. 6. Aortic atherosclerosis.   11/23/2019 Imaging   1. Lesion in the interpolar RIGHT kidney biased towards the lower pole measures 2 x 1.7 cm. Areas of low level enhancement are present on delayed phase imaging. 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 Biopsy may be helpful to determine further management as clinically warranted. 2. Status post LEFT partial nephrectomy. 3. No evidence of metastatic disease in the abdomen. 4. Hepatic cysts.     REVIEW OF SYSTEMS:   Constitutional: Denies fevers, chills or abnormal weight loss Eyes: Denies blurriness of vision Ears, nose, mouth, throat, and face: Denies mucositis or sore throat Respiratory: Denies cough, dyspnea or wheezes Cardiovascular: Denies palpitation, chest discomfort or lower extremity swelling Gastrointestinal:  Denies nausea, heartburn or change in bowel habits Skin: Denies abnormal skin rashes Lymphatics: Denies new lymphadenopathy or easy  bruising Neurological:Denies numbness, tingling or new weaknesses Behavioral/Psych: Mood is stable, no new changes  All other systems were reviewed with the patient and are negative.  I have reviewed the past medical history, past surgical history, social history and family history with the patient and they are unchanged from previous note.  ALLERGIES:  has No Known Allergies.  MEDICATIONS:  Current Outpatient Medications  Medication Sig Dispense Refill  . ARTIFICIAL TEAR OINTMENT OP Place 1 application into both eyes daily as needed (For dry eyes.).     Marland Kitchen aspirin EC 81 MG tablet Take 81 mg by mouth daily.     Marland Kitchen b complex vitamins capsule Take 1 capsule by mouth daily.     . diclofenac Sodium (VOLTAREN) 1 % GEL SMARTSIG:Sparingly Topical 3 Times Daily PRN    . dorzolamide (TRUSOPT) 2 % ophthalmic solution Place 1 drop into both eyes 2 (two) times daily.     . dorzolamide-timolol (COSOPT) 22.3-6.8 MG/ML ophthalmic solution Place 1 drop into both eyes 2 (two) times daily.     . ferrous sulfate 325 (65 FE) MG tablet Take 1 tablet (325 mg total) by mouth daily. (Patient not taking: Reported on 11/01/2019) 90 tablet 3  . finasteride (PROSCAR) 5 MG tablet Take 5 mg by mouth every evening.   3  . hydrocortisone (ANUSOL-HC) 25 MG suppository Place 25 mg rectally daily as needed for hemorrhoids or anal itching.     . hydrocortisone 2.5 % cream Apply topically 2 (two) times daily.     . indomethacin (INDOCIN) 25 MG capsule Take 25 mg by mouth 2 (two) times daily with a meal. (Patient not taking: Reported on 11/01/2019)    . lanolin ointment Apply topically as needed (apply to eye).     Marland Kitchen latanoprost (XALATAN) 0.005 % ophthalmic solution Place 1 drop into both eyes at bedtime.     Marland Kitchen lisinopril (PRINIVIL,ZESTRIL) 20 MG tablet Take 20 mg by mouth every morning.     . metoprolol tartrate (LOPRESSOR) 50 MG tablet Take 1 pill (50 mg) 2 hours before your coronary CT (Patient not taking: Reported on 11/01/2019)  1 tablet 0  . Multiple Vitamins-Minerals (MULTIVITAMINS THER. W/MINERALS) TABS tablet Take 1 tablet by mouth daily.     . nitroGLYCERIN (NITROSTAT) 0.4 MG SL tablet Place 1 tablet (0.4 mg total) under the tongue every 5 (five) minutes as needed for chest pain. (Patient not taking: Reported on 11/01/2019) 25 tablet 3  . Omega-3 Fatty Acids (FISH OIL PO) Take 4 capsules by mouth daily.     Marland Kitchen omeprazole (PRILOSEC) 20 MG capsule Take 20 mg by mouth daily.     . rosuvastatin (CRESTOR) 10 MG tablet Take 1 tablet (10 mg total) by mouth daily. 90 tablet 3  . travoprost, benzalkonium, (TRAVATAN) 0.004 % ophthalmic solution Place 1 drop into both eyes at bedtime.     . triamcinolone cream (KENALOG) 0.1 % Apply 1 application topically 2 (two) times daily.      No current facility-administered medications for this visit.    PHYSICAL EXAMINATION: ECOG PERFORMANCE STATUS: 1 - Symptomatic but completely ambulatory  Vitals:   11/29/19 1202  BP: (!) 132/56  Pulse: 61  Resp: 18  Temp: 97.7 F (36.5 C)  SpO2: 100%   Filed Weights   11/29/19 1202  Weight: 196 lb (88.9 kg)    GENERAL:alert, no distress and comfortable  NEURO: alert & oriented x 3 with fluent speech, no focal motor/sensory deficits  LABORATORY DATA:  I have reviewed the data as listed    Component Value Date/Time   NA 139 11/09/2019 1009   NA 139 05/03/2019 0854   NA 140 10/16/2016 0816   K 4.9 11/09/2019 1009   K 4.1 10/16/2016 0816   CL 109 11/09/2019 1009   CO2 26 11/09/2019 1009   CO2 24 10/16/2016 0816   GLUCOSE 76 11/09/2019 1009   GLUCOSE 96 10/16/2016 0816   BUN 21 11/09/2019 1009   BUN 21 05/03/2019 0854   BUN 17.4 10/16/2016 0816   CREATININE 1.55 (H) 11/09/2019 1009   CREATININE 1.8 (H) 10/16/2016 0816   CALCIUM 9.6 11/09/2019 1009   CALCIUM 9.6 10/16/2016 0816   PROT 7.3 11/09/2019 1009   PROT 6.7 05/03/2019 0854   PROT 7.2 10/16/2016 0816   ALBUMIN 3.8 11/09/2019 1009   ALBUMIN 4.2 05/03/2019 0854    ALBUMIN 3.5 10/16/2016 0816   AST 18 11/09/2019 1009   AST 20 10/16/2016 0816   ALT 13 11/09/2019 1009   ALT 16 10/16/2016 0816   ALKPHOS 64 11/09/2019 1009   ALKPHOS 69 10/16/2016 0816   BILITOT 0.5 11/09/2019 1009   BILITOT 0.2 05/03/2019 0854   BILITOT 0.44 10/16/2016 0816   GFRNONAA 42 (L) 11/09/2019 1009   GFRAA 49 (L) 11/09/2019 1009    No results found for: SPEP, UPEP  Lab Results  Component Value Date   WBC 4.9 11/09/2019   NEUTROABS 2.8 11/09/2019   HGB 11.0 (L) 11/09/2019   HCT 33.2 (L) 11/09/2019   MCV 99.4 11/09/2019   PLT 232 11/09/2019      Chemistry      Component Value Date/Time   NA 139 11/09/2019 1009   NA 139 05/03/2019 0854   NA 140 10/16/2016 0816   K 4.9 11/09/2019 1009   K 4.1 10/16/2016 0816   CL 109 11/09/2019 1009   CO2 26 11/09/2019 1009   CO2 24 10/16/2016 0816   BUN 21 11/09/2019 1009   BUN 21 05/03/2019 0854   BUN 17.4 10/16/2016 0816   CREATININE 1.55 (H) 11/09/2019 1009   CREATININE 1.8 (H) 10/16/2016 0816      Component Value Date/Time   CALCIUM 9.6 11/09/2019 1009   CALCIUM 9.6 10/16/2016 0816   ALKPHOS 64 11/09/2019 1009   ALKPHOS 69 10/16/2016 0816   AST 18 11/09/2019 1009   AST 20 10/16/2016 0816   ALT 13 11/09/2019 1009   ALT 16 10/16/2016 0816   BILITOT 0.5 11/09/2019 1009   BILITOT 0.2 05/03/2019 0854   BILITOT 0.44 10/16/2016 0816       RADIOGRAPHIC STUDIES: I have reviewed recent MRI imaging with the patient  I have personally reviewed the radiological images as listed and agreed with the findings in the report. CT Abdomen Pelvis W Wo Contrast  Result Date: 11/09/2019 CLINICAL DATA:  Urologic cancer, staging post resection of duodenal tumor and papillary renal cell carcinoma EXAM: CT ABDOMEN AND PELVIS WITHOUT AND WITH CONTRAST TECHNIQUE: Multidetector CT imaging of the abdomen and pelvis was performed following the standard protocol before and following the bolus administration of intravenous contrast. CONTRAST:   136mL OMNIPAQUE IOHEXOL 300 MG/ML  SOLN COMPARISON:  November 04, 2018 FINDINGS: Lower chest: Small pericardial effusion is similar to previous imaging. Hepatobiliary: Portal vein is patent. Hepatic cysts are stable. Hepatic veins are patent. No pericholecystic stranding. No biliary duct dilation. Pancreas: Pancreas without focal lesion,  ductal dilation or signs of inflammation. Spleen: Spleen normal in size and contour, unchanged from previous exam. Adrenals/Urinary Tract: Adrenal glands are normal. Bilateral renal cysts some with proteinaceous and/or hemorrhagic features similar to prior imaging. No hydronephrosis. No nephrolithiasis. There is however a lesion in the lower-interpolar portion of the RIGHT kidney extending to the hilar line and just beyond that measures approximately 21 Hounsfield units at baseline and approximately 41 Hounsfield units post-contrast on late venous phase this measures approximately 1.8 x 1.9 cm previously approximately 1.4 x 1.1 cm. Postoperative changes of LEFT partial nephrectomy. No signs of disease recurrence in this area. Stomach/Bowel: Colonic diverticulosis. No diverticulitis. Normal appendix. Stomach under distended limiting assessment. Small bowel of normal caliber without perienteric stranding or acute small bowel process. Vascular/Lymphatic: Calcified atheromatous plaque and noncalcified plaque in the abdominal aorta without aneurysmal dilation. There is no gastrohepatic or hepatoduodenal ligament lymphadenopathy. No retroperitoneal or mesenteric lymphadenopathy. No pelvic lymphadenopathy. Reproductive: Marked heterogeneity of an enlarged prostate shows no change. Nonspecific finding on CT. Other: No ascites. Musculoskeletal: No acute musculoskeletal findings. Spinal degenerative changes. IMPRESSION: 1. Postoperative changes of LEFT partial nephrectomy without signs of disease recurrence in this area. 2. There is a lesion in the lower-interpolar portion of the RIGHT kidney  extending to the hilar line and just beyond that that measures approximately 1.8 x 1.9 cm previously approximately 1.4 x 1.1 cm. This could represent a small papillary renal neoplasm, MRI may be helpful for further assessment. 3. No signs of periampullary disease recurrence to the extent evaluated on CT. 4. Bilateral renal cysts some with proteinaceous and/or hemorrhagic features, otherwise not significantly changed. 5. Colonic diverticulosis. 6. Aortic atherosclerosis. These results will be called to the ordering clinician or representative by the Radiologist Assistant, and communication documented in the PACS or [employer]. Aortic Atherosclerosis (ICD10-I70.0). Electronically Signed   By: Zetta Bills M.D.   On: 11/09/2019 13:49   MR ABDOMEN W WO CONTRAST  Result Date: 11/23/2019 CLINICAL DATA:  Urologic cancer, surveillance of RIGHT renal mass suggested. EXAM: MRI ABDOMEN WITHOUT AND WITH CONTRAST TECHNIQUE: Multiplanar multisequence MR imaging of the abdomen was performed both before and after the administration of intravenous contrast. CONTRAST:  44mL GADAVIST GADOBUTROL 1 MMOL/ML IV SOLN COMPARISON:  None. FINDINGS: Lower chest: Incidental imaging of the lung bases on MRI is unremarkable. No consolidation or effusion on limited assessment. Hepatobiliary: Hepatic cysts similar to previous imaging. No pericholecystic stranding. No biliary duct dilation. Pancreas: Normal pancreatic signal without ductal dilation or sign of inflammation. Spleen:  Spleen normal in size and contour. Adrenals/Urinary Tract:  Adrenal glands are normal. Lesion in the interpolar RIGHT kidney biased towards the lower pole measuring 2 x 1.7 cm showing T2 hypointensity, mildly heterogeneous relative to the adjacent renal cortex. Rather than profound hypointensity this shows intermediate T2 signal. Area is mixed signal on T1.w following contrast administration signal intensity on delayed phase imaging measured at 96, pre contrast  signal intensity of 69 heterogeneity and mild misregistration makes subtraction somewhat challenging though subtraction suggests that this low level enhancement is present this is best seen on image 62 of series 101 a Signs of LEFT partial nephrectomy. Hemorrhagic and simple cysts elsewhere in the kidneys with similar appearance to recent imaging. Stomach/Bowel: Limited assessment of gastrointestinal structures without acute process. Vascular/Lymphatic: Atheromatous plaque of the abdominal aorta, nonaneurysmal. There is no gastrohepatic or hepatoduodenal ligament lymphadenopathy. No retroperitoneal or mesenteric lymphadenopathy. Other:  No ascites. Musculoskeletal: No suspicious bone lesions identified. IMPRESSION: 1. Lesion in  the interpolar RIGHT kidney biased towards the lower pole measures 2 x 1.7 cm. Areas of low level enhancement are present on delayed phase imaging. 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 Biopsy may be helpful to determine further management as clinically warranted. 2. Status post LEFT partial nephrectomy. 3. No evidence of metastatic disease in the abdomen. 4. Hepatic cysts. Electronically Signed   By: Zetta Bills M.D.   On: 11/23/2019 09:17

## 2019-11-29 NOTE — Assessment & Plan Note (Signed)
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

## 2019-11-29 NOTE — Assessment & Plan Note (Signed)
CT imaging showed no evidence of disease within the stomach region He is not anemic Observe only for now from the GIST standpoint

## 2019-12-07 ENCOUNTER — Telehealth: Payer: Self-pay | Admitting: Radiation Oncology

## 2019-12-07 NOTE — Telephone Encounter (Signed)
Received voicemail message from patient requesting a return call. Phoned patient to inquire. Patient questions when he will be scheduled for an appointment with Dr. Tammi Klippel. Upon further investigation he has been referred to Dr. [name] not Dr. Tyler Pita. Dr. Alvy Bimler placed this referral following an MRI last week. Provided patient with phone number and address for [facility]. Patient verbalized understanding of all reviewed and appreciation for my assistance.

## 2020-02-11 ENCOUNTER — Other Ambulatory Visit: Payer: Self-pay | Admitting: Cardiovascular Disease

## 2020-05-06 ENCOUNTER — Other Ambulatory Visit: Payer: Self-pay | Admitting: Cardiovascular Disease

## 2020-08-20 ENCOUNTER — Other Ambulatory Visit: Payer: Self-pay | Admitting: *Deleted

## 2020-08-20 MED ORDER — ROSUVASTATIN CALCIUM 10 MG PO TABS: 10.0000 mg | ORAL_TABLET | Freq: Every day | ORAL | 0 refills | Status: AC

## 2020-10-30 ENCOUNTER — Other Ambulatory Visit: Payer: Self-pay

## 2020-10-30 ENCOUNTER — Other Ambulatory Visit: Payer: Medicare HMO

## 2020-10-30 DIAGNOSIS — R972 Elevated prostate specific antigen [PSA]: Secondary | ICD-10-CM

## 2020-10-31 LAB — PSA: Prostate Specific Ag, Serum: 3.4 ng/mL (ref 0.0–4.0)

## 2020-11-05 NOTE — Progress Notes (Signed)
History of Present Illness: Patient here for follow-up of elevated PSA, erectile dysfunction and BPH.    His last PSA was performed approximately 09/14/2018. The last PSA value was 2.5.   Prior h/o elevated PSA with negative BX x 3 previously most recently 2006 at time TRUS 72m. PSA peak of 14.  2012 PSA 4.2 (avodart)  2017 PSA 4.4 (avodart) age 80  11.15.2017--3.0  11.12.2018--2.6 7.30.2019: PSA 2.4.  7.21.2020:  PSA 2.5.  7.1.2021--2.2 (finasteride correction 4.4) 9.6.2022: PSA 3.4 (correction 6.8)   2.26.2019--he is here today for reintroduction to prostaglandin injections.   7.30.2019: 30 mcg doses cause pain--he would like to decrease dose   7.21.2020: He reports mild satisfaction with injection use thus far, but has had some difficulties with sex still. He expresses that he wants to continue with this treatment.   8.6.2022: Most recent PSA 3.4.  Finasteride correction 6.8.  Is still on finasteride.  No significant lower urinary tract symptoms of note.  No gross hematuria, no recent urinary tract infections.  He is no longer using intracorporeal injections.     Past Medical History:  Diagnosis Date   Adenomatous colon polyp 09/21/2014   Anemia in chronic illness 03/07/2015   being evaluated   Arthritis    Atrial flutter (HCC)    Bilateral hearing loss 03/15/2015   Cancer of intestine (HCC)    Chronic kidney disease (CKD) 03/07/2015   Chronic kidney disease, stage III (moderate) 03/07/2015   Chronic right shoulder pain 02/03/2018   GERD (gastroesophageal reflux disease)    GIST (gastrointestinal stroma tumor), malignant, colon ([facility] 2017   Duodenom,    Glaucoma    both eyes   Hearing impairment    Hypercholesterolemia    Hypertension    Iron deficiency anemia 03/15/2015   Kidney lesion, native, left 07/12/2015   Lateral meniscus derangement 01/12/2013   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   Osteoarthritis of right knee 01/12/2013   Prostate enlargement    [place]  [place] [place]   Skin lesion of left leg 03/07/2015    Past Surgical History:  Procedure Laterality Date   colonoscopy     ESOPHAGOGASTRODUODENOSCOPY (EGD) WITH PROPOFOL N/A 06/08/2017   Procedure: ESOPHAGOGASTRODUODENOSCOPY (EGD) WITH PROPOFOL;  Surgeon: GGatha Mayer MD;  Location: [facility];  Service: Endoscopy;  Laterality: N/A;   EUS N/A 07/05/2015   Procedure: UPPER ENDOSCOPIC ULTRASOUND (EUS) LINEAR;  Surgeon: DMilus Banister MD;  Location: [facility];  Service: Endoscopy;  Laterality: N/A;   KNEE ARTHROSCOPY WITH LATERAL MENISECTOMY Right 01/21/2013   Procedure: KNEE ARTHROSCOPY WITH PARTIAL LATERAL MENISECTOMY;  Surgeon: SCarole Civil MD;  Location: [facility];  Service: Orthopedics;  Laterality: Right;   ORIF ANKLE DISLOCATION Right 15 yrs ago   ROBOTIC ASSITED PARTIAL NEPHRECTOMY Left 10/17/2015   Procedure: XI ROBOTIC ASSITED LEFT PARTIAL NEPHRECTOMY, Left Cyst Decortication, Left Renal Ultrasound;  Surgeon: TAlexis Frock MD;  Location: [facility];  Service: Urology;  Laterality: Left;    Home Medications:  Allergies as of 11/06/2020   No Known Allergies      Medication List        Accurate as of November 05, 2020  8:38 PM. If you have any questions, ask your nurse or doctor.          ARTIFICIAL TEAR OINTMENT OP Place 1 application into both eyes daily as needed (For dry eyes.).   aspirin EC 81 MG tablet Take 81 mg by mouth daily.   b  complex vitamins capsule Take 1 capsule by mouth daily.   diclofenac Sodium 1 % Gel Commonly known as: VOLTAREN SMARTSIG:Sparingly Topical 3 Times Daily PRN   dorzolamide 2 % ophthalmic solution Commonly known as: TRUSOPT Place 1 drop into both eyes 2 (two) times daily.   dorzolamide-timolol 22.3-6.8 MG/ML ophthalmic solution Commonly known as: COSOPT Place 1 drop into both eyes 2 (two) times daily.   ferrous sulfate 325 (65 FE) MG tablet Take 1 tablet (325 mg total) by mouth daily.   finasteride 5 MG  tablet Commonly known as: PROSCAR Take 5 mg by mouth every evening.   FISH OIL PO Take 4 capsules by mouth daily.   hydrocortisone 2.5 % cream Apply topically 2 (two) times daily.   hydrocortisone 25 MG suppository Commonly known as: ANUSOL-HC Place 25 mg rectally daily as needed for hemorrhoids or anal itching.   indomethacin 25 MG capsule Commonly known as: INDOCIN Take 25 mg by mouth 2 (two) times daily with a meal.   lanolin ointment Apply topically as needed (apply to eye).   latanoprost 0.005 % ophthalmic solution Commonly known as: XALATAN Place 1 drop into both eyes at bedtime.   lisinopril 20 MG tablet Commonly known as: ZESTRIL Take 20 mg by mouth every morning.   metoprolol tartrate 50 MG tablet Commonly known as: LOPRESSOR Take 1 pill (50 mg) 2 hours before your coronary CT   multivitamins ther. w/minerals Tabs tablet Take 1 tablet by mouth daily.   nitroGLYCERIN 0.4 MG SL tablet Commonly known as: NITROSTAT Place 1 tablet (0.4 mg total) under the tongue every 5 (five) minutes as needed for chest pain.   omeprazole 20 MG capsule Commonly known as: PRILOSEC Take 20 mg by mouth daily.   rosuvastatin 10 MG tablet Commonly known as: CRESTOR Take 1 tablet (10 mg total) by mouth daily. Please schedule yearly appointment for future refills. Thank you   travoprost (benzalkonium) 0.004 % ophthalmic solution Commonly known as: TRAVATAN Place 1 drop into both eyes at bedtime.   triamcinolone cream 0.1 % Commonly known as: KENALOG Apply 1 application topically 2 (two) times daily.        Allergies: No Known Allergies  Family History  Problem Relation Age of Onset   Hypertension Father     Social History:  reports that he quit smoking about 39 years ago. His smoking use included cigarettes. He has a 20.00 pack-year smoking history. He has never used smokeless tobacco. He reports that he does not drink alcohol and does not use drugs.  ROS: A  complete review of systems was performed.  All systems are negative except for pertinent findings as noted.  Physical Exam:  Vital signs in last 24 hours: There were no vitals taken for this visit. Constitutional:  Alert and oriented, No acute distress Cardiovascular: Regular rate  Respiratory: Normal respiratory effort GI: Abdomen is soft, nontender, nondistended, no abdominal masses. No CVAT.  Genitourinary: Normal male phallus, testes are descended bilaterally and non-tender and without masses, scrotum is normal in appearance without lesions or masses, perineum is normal on inspection.  Normal anal sphincter tone, prostate 40 mL, symmetrical, nonnodular, nontender. Lymphatic: No lymphadenopathy Neurologic: Grossly intact, no focal deficits Psychiatric: Normal mood and affect  I have reviewed prior pt notes  I have reviewed notes from referring/previous physicians  I have reviewed urinalysis results  I have reviewed prior PSA results     Impression/Assessment:  1.  BPH, doing well on finasteride  2.  Elevated PSA,  fairly stable despite slight upward check this past year.  Benign exam.  3.  ED, failed therapy  Plan:  1.  Continue finasteride  2.  I will continue annual follow-up year with PSA/DRE

## 2020-11-06 ENCOUNTER — Ambulatory Visit: Payer: Medicare HMO | Admitting: Urology

## 2020-11-06 ENCOUNTER — Other Ambulatory Visit: Payer: Self-pay

## 2020-11-06 VITALS — BP 135/75 | HR 69

## 2020-11-06 DIAGNOSIS — N138 Other obstructive and reflux uropathy: Secondary | ICD-10-CM

## 2020-11-06 DIAGNOSIS — N5201 Erectile dysfunction due to arterial insufficiency: Secondary | ICD-10-CM

## 2020-11-06 DIAGNOSIS — R972 Elevated prostate specific antigen [PSA]: Secondary | ICD-10-CM | POA: Diagnosis not present

## 2020-11-06 DIAGNOSIS — N401 Enlarged prostate with lower urinary tract symptoms: Secondary | ICD-10-CM

## 2020-11-06 LAB — URINALYSIS, ROUTINE W REFLEX MICROSCOPIC
Bilirubin, UA: NEGATIVE
Glucose, UA: NEGATIVE
Ketones, UA: NEGATIVE
Leukocytes,UA: NEGATIVE
Nitrite, UA: NEGATIVE
Protein,UA: NEGATIVE
Specific Gravity, UA: 1.02 (ref 1.005–1.030)
Urobilinogen, Ur: 0.2 mg/dL (ref 0.2–1.0)
pH, UA: 7 (ref 5.0–7.5)

## 2020-11-06 LAB — MICROSCOPIC EXAMINATION
Bacteria, UA: NONE SEEN
Epithelial Cells (non renal): NONE SEEN /hpf (ref 0–10)
RBC, Urine: NONE SEEN /hpf (ref 0–2)
Renal Epithel, UA: NONE SEEN /hpf
WBC, UA: NONE SEEN /hpf (ref 0–5)

## 2020-11-06 MED ORDER — FINASTERIDE 5 MG PO TABS
5.0000 mg | ORAL_TABLET | Freq: Every evening | ORAL | 3 refills | Status: DC
Start: 2020-11-06 — End: 2021-11-21

## 2020-11-06 NOTE — Progress Notes (Signed)

## 2020-11-09 ENCOUNTER — Telehealth: Payer: Self-pay | Admitting: Hematology and Oncology

## 2020-11-09 NOTE — Telephone Encounter (Signed)
Rescheduled per provider. Called and spoke with pt confirmed appts

## 2020-11-12 DIAGNOSIS — Z961 Presence of intraocular lens: Secondary | ICD-10-CM | POA: Insufficient documentation

## 2020-11-13 ENCOUNTER — Telehealth: Payer: Self-pay

## 2020-11-13 NOTE — Telephone Encounter (Signed)
Called and left a message asking him to call the office back. 

## 2020-11-13 NOTE — Telephone Encounter (Signed)
-----   Message from [name], MD sent at 11/13/2020  7:56 AM EDT ----- Pls call him I have to leave work by 315 pm so I have to wrap up by 3 pm If he can come in earlier and be seen at 245 pm I can make it work on the day he is scheduled If not, we have move his appt a bit earlier

## 2020-11-22 ENCOUNTER — Telehealth: Payer: Self-pay | Admitting: *Deleted

## 2020-11-22 NOTE — Telephone Encounter (Signed)
Patient called office - LVM asking to confirm new appt time. Returned patient's call - LVM on named voice mail with new times.

## 2020-11-29 ENCOUNTER — Ambulatory Visit: Payer: Medicare HMO | Admitting: Hematology and Oncology

## 2020-11-29 ENCOUNTER — Other Ambulatory Visit: Payer: Medicare HMO

## 2020-12-05 ENCOUNTER — Other Ambulatory Visit: Payer: Self-pay | Admitting: Hematology and Oncology

## 2020-12-05 DIAGNOSIS — C642 Malignant neoplasm of left kidney, except renal pelvis: Secondary | ICD-10-CM

## 2020-12-05 DIAGNOSIS — C49A3 Gastrointestinal stromal tumor of small intestine: Secondary | ICD-10-CM

## 2020-12-06 ENCOUNTER — Other Ambulatory Visit: Payer: Medicare HMO

## 2020-12-06 ENCOUNTER — Encounter: Payer: Self-pay | Admitting: Hematology and Oncology

## 2020-12-06 ENCOUNTER — Inpatient Hospital Stay (HOSPITAL_BASED_OUTPATIENT_CLINIC_OR_DEPARTMENT_OTHER): Payer: Medicare HMO | Admitting: Hematology and Oncology

## 2020-12-06 ENCOUNTER — Ambulatory Visit: Payer: Self-pay | Admitting: Hematology and Oncology

## 2020-12-06 ENCOUNTER — Telehealth: Payer: Self-pay

## 2020-12-06 ENCOUNTER — Inpatient Hospital Stay: Payer: Medicare HMO | Attending: Hematology and Oncology

## 2020-12-06 ENCOUNTER — Other Ambulatory Visit: Payer: Self-pay

## 2020-12-06 DIAGNOSIS — D638 Anemia in other chronic diseases classified elsewhere: Secondary | ICD-10-CM | POA: Diagnosis not present

## 2020-12-06 DIAGNOSIS — C49A3 Gastrointestinal stromal tumor of small intestine: Secondary | ICD-10-CM

## 2020-12-06 DIAGNOSIS — C642 Malignant neoplasm of left kidney, except renal pelvis: Secondary | ICD-10-CM

## 2020-12-06 DIAGNOSIS — Z905 Acquired absence of kidney: Secondary | ICD-10-CM | POA: Insufficient documentation

## 2020-12-06 DIAGNOSIS — Z85068 Personal history of other malignant neoplasm of small intestine: Secondary | ICD-10-CM | POA: Insufficient documentation

## 2020-12-06 DIAGNOSIS — N1831 Chronic kidney disease, stage 3a: Secondary | ICD-10-CM | POA: Diagnosis not present

## 2020-12-06 DIAGNOSIS — N183 Chronic kidney disease, stage 3 unspecified: Secondary | ICD-10-CM | POA: Diagnosis not present

## 2020-12-06 DIAGNOSIS — Z85528 Personal history of other malignant neoplasm of kidney: Secondary | ICD-10-CM | POA: Diagnosis not present

## 2020-12-06 LAB — CBC WITH DIFFERENTIAL/PLATELET
Abs Immature Granulocytes: 0 10*3/uL (ref 0.00–0.07)
Basophils Absolute: 0.1 10*3/uL (ref 0.0–0.1)
Basophils Relative: 1 %
Eosinophils Absolute: 0.3 10*3/uL (ref 0.0–0.5)
Eosinophils Relative: 6 %
HCT: 30.6 % — ABNORMAL LOW (ref 39.0–52.0)
Hemoglobin: 10.1 g/dL — ABNORMAL LOW (ref 13.0–17.0)
Immature Granulocytes: 0 %
Lymphocytes Relative: 30 %
Lymphs Abs: 1.4 10*3/uL (ref 0.7–4.0)
MCH: 32.7 pg (ref 26.0–34.0)
MCHC: 33 g/dL (ref 30.0–36.0)
MCV: 99 fL (ref 80.0–100.0)
Monocytes Absolute: 0.5 10*3/uL (ref 0.1–1.0)
Monocytes Relative: 11 %
Neutro Abs: 2.5 10*3/uL (ref 1.7–7.7)
Neutrophils Relative %: 52 %
Platelets: 235 10*3/uL (ref 150–400)
RBC: 3.09 MIL/uL — ABNORMAL LOW (ref 4.22–5.81)
RDW: 14.1 % (ref 11.5–15.5)
WBC: 4.9 10*3/uL (ref 4.0–10.5)
nRBC: 0 % (ref 0.0–0.2)

## 2020-12-06 LAB — COMPREHENSIVE METABOLIC PANEL
ALT: 12 U/L (ref 0–44)
AST: 16 U/L (ref 15–41)
Albumin: 3.7 g/dL (ref 3.5–5.0)
Alkaline Phosphatase: 80 U/L (ref 38–126)
Anion gap: 5 (ref 5–15)
BUN: 30 mg/dL — ABNORMAL HIGH (ref 8–23)
CO2: 24 mmol/L (ref 22–32)
Calcium: 9.2 mg/dL (ref 8.9–10.3)
Chloride: 112 mmol/L — ABNORMAL HIGH (ref 98–111)
Creatinine, Ser: 1.4 mg/dL — ABNORMAL HIGH (ref 0.61–1.24)
GFR, Estimated: 51 mL/min — ABNORMAL LOW (ref >60)
Glucose, Bld: 91 mg/dL (ref 70–99)
Potassium: 4.4 mmol/L (ref 3.5–5.1)
Sodium: 141 mmol/L (ref 135–145)
Total Bilirubin: 0.2 mg/dL — ABNORMAL LOW (ref 0.3–1.2)
Total Protein: 6.9 g/dL (ref 6.5–8.1)

## 2020-12-06 LAB — IRON AND TIBC
Iron: 46 ug/dL (ref 42–163)
Saturation Ratios: 19 % — ABNORMAL LOW (ref 20–55)
TIBC: 245 ug/dL (ref 202–409)
UIBC: 199 ug/dL (ref 117–376)

## 2020-12-06 LAB — FERRITIN: Ferritin: 213 ng/mL (ref 24–336)

## 2020-12-06 NOTE — Assessment & Plan Note (Signed)
His last abdominal imaging was in 2021 He is not symptomatic Denies flank pain no hematuria He has no follow-up with urologist for some time, last note was in October 2021 I will copy his progress note to his urologist and will request follow-up for him

## 2020-12-06 NOTE — Assessment & Plan Note (Signed)
CT imaging showed no evidence of disease within the stomach region Observe only for now from the GIST standpoint We can order repeat EGD in 2024

## 2020-12-06 NOTE — Telephone Encounter (Signed)
-----   Message from [name], MD sent at 12/06/2020  3:53 PM EDT ----- Pls call him Iron studies are ok Stop oral iron and take it off his med list

## 2020-12-06 NOTE — Assessment & Plan Note (Signed)
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 He has history of iron deficiency but repeat iron studies are pending and will call the patient with test result His anemia is stable. He does not require any further workup or treatment now.

## 2020-12-06 NOTE — Telephone Encounter (Signed)
Called and given below message. He verbalized understanding. 

## 2020-12-06 NOTE — Assessment & Plan Note (Signed)
He has stable chronic kidney disease stage III since nephrectomy

## 2020-12-06 NOTE — Progress Notes (Signed)
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 10/17/2015  CT imaging showed no evidence of disease within the stomach region Observe only for now from the GIST standpoint We can order repeat EGD in 2024  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 He has history of iron deficiency but repeat iron studies are pending and will call the patient with test result His anemia is stable. He does not require any further workup or treatment now.  No orders of the defined types were placed in this encounter.   All questions were answered. The patient knows to call the clinic with any problems, questions or concerns. The total time spent in the appointment was 20 minutes encounter with patients including review of chart and various tests results, discussions about plan of care and coordination of care plan   [name], MD 12/06/2020 2:57 PM  INTERVAL HISTORY: Please see below for problem oriented  charting. he returns for treatment follow-up for history of malignant gastrointestinal stromal tumor as well as kidney cancer He is doing well Denies abdominal pain or changes in bowel habits No recent bleeding Denies recent hematuria He does not have an appointment to see urologist  REVIEW OF SYSTEMS:   Constitutional: Denies fevers, chills or abnormal weight loss Eyes: Denies blurriness of vision Ears, nose, mouth, throat, and face: Denies mucositis or sore throat Respiratory: Denies cough, dyspnea or wheezes Cardiovascular: Denies palpitation, chest discomfort or lower extremity swelling Gastrointestinal:  Denies nausea, heartburn or change in bowel habits Skin: Denies abnormal skin rashes Lymphatics: Denies new lymphadenopathy or easy bruising Neurological:Denies numbness, tingling or new weaknesses Behavioral/Psych: Mood is stable, no new changes  All other systems were reviewed with the patient and are negative.  I have reviewed the past medical history, past surgical history, social history and family history with the patient and they are unchanged from previous note.  ALLERGIES:  has No Known Allergies.  MEDICATIONS:  Current Outpatient Medications  Medication Sig Dispense Refill   ARTIFICIAL TEAR OINTMENT OP Place 1 application into both eyes daily as needed (For dry eyes.).      aspirin EC 81 MG tablet Take 81 mg by mouth daily.      b complex vitamins capsule Take 1 capsule by mouth daily.      diclofenac Sodium (VOLTAREN) 1 % GEL SMARTSIG:Sparingly Topical 3 Times Daily PRN     dorzolamide (TRUSOPT) 2 % ophthalmic solution Place 1 drop into both eyes 2 (two) times daily.      dorzolamide-timolol (COSOPT) 22.3-6.8 MG/ML ophthalmic solution Place 1 drop into both eyes  2 (two) times daily.      ferrous sulfate 325 (65 FE) MG tablet Take 1 tablet (325 mg total) by mouth daily. 90 tablet 3   finasteride (PROSCAR) 5 MG tablet Take 1 tablet (5 mg total) by mouth every evening.  90 tablet 3   hydrocortisone (ANUSOL-HC) 25 MG suppository Place 25 mg rectally daily as needed for hemorrhoids or anal itching.      hydrocortisone 2.5 % cream Apply topically 2 (two) times daily.      indomethacin (INDOCIN) 25 MG capsule Take 25 mg by mouth 2 (two) times daily with a meal.     lanolin ointment Apply topically as needed (apply to eye).      lisinopril (PRINIVIL,ZESTRIL) 20 MG tablet Take 20 mg by mouth every morning.      Multiple Vitamins-Minerals (MULTIVITAMINS THER. W/MINERALS) TABS tablet Take 1 tablet by mouth daily.      nitroGLYCERIN (NITROSTAT) 0.4 MG SL tablet Place 1 tablet (0.4 mg total) under the tongue every 5 (five) minutes as needed for chest pain. 25 tablet 3   Omega-3 Fatty Acids (FISH OIL PO) Take 4 capsules by mouth daily.      omeprazole (PRILOSEC) 20 MG capsule Take 20 mg by mouth daily.      rosuvastatin (CRESTOR) 10 MG tablet Take 1 tablet (10 mg total) by mouth daily. Please schedule yearly appointment for future refills. Thank you 30 tablet 0   travoprost, benzalkonium, (TRAVATAN) 0.004 % ophthalmic solution Place 1 drop into both eyes at bedtime.      triamcinolone cream (KENALOG) 0.1 % Apply 1 application topically 2 (two) times daily.      No current facility-administered medications for this visit.    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    06/29/2015 Imaging   MRI abdomen  showed 1.9 cm enhancing lesion along the lateral left lower kidney, corresponding to the CT abnormality, suspicious for solid renal neoplasm such as renal cell carcinoma   07/05/2015 Procedure   EUS confirmed 2.6 cm mass opposite opening of the ampulla   07/05/2015 Pathology Results   Accession: [id number] FNA was positive for GIST   10/17/2015 Surgery   He underwent combined stomach resection and kidney resection   10/17/2015 Pathology Results   Accession: [id number] pathology from stomach revealed 3.5 cm GIST with low mitotic rate but positive margin. He also has papillary cell carcinoma involving the kidney   10/16/2016 Imaging   1. Status post resection of a duodenum GIST. No findings for residual or recurrent tumor or metastatic disease. 2. Status post partial left nephrectomy for small renal cell carcinoma. No findings for recurrent tumor or metastatic disease. 3. Numerous bilateral renal cysts, some of which are hyperdense/hemorrhagic. No worrisome renal lesions. 4. Markedly enlarged prostate gland. 5. Small pericardial effusion and scattered mediastinal lymph nodes but no mass or overt adenopathy. 6. Benign-appearing hepatic cysts.   02/05/2017 Imaging   Ct scan of abdomen and pelvis  showed no evidence of recurrent kidney cancer or GIST tumor. Probable small bowel enteritis   06/08/2017 Pathology Results   Duodenum, Biopsy - PEPTIC DUODENITIS - NO DYSPLASIA OR MALIGNANCY IDENTIFIED   06/08/2017 Procedure   There was evidence of a patent previous surgical anastomosis in the first portion of the duodenum. This was characterized by healthy appearing mucosa. Biopsies were taken with a cold forceps for histology. Verification of patient identification for the specimen was done. Estimated blood loss was minimal. Findings: A non-obstructing Schatzki ring was found at the gastroesophageal junction. A small hiatal hernia was present. The cardia and gastric fundus were normal on  retroflexion. The exam was otherwise without abnormality. - Patent previous surgical anastomosis, characterized by healthy appearing mucosa was found in the duodenum. Biopsied. Was not photogtraphed - Non-obstructing Schatzki ring. - Small hiatal hernia. - The examination was otherwise   11/04/2018 Imaging   1. No evidence of recurrent tumor in the periampullary duodenum or in the partial nephrectomy bed in the lower left kidney on this noncontrast scan. 2. No findings of metastatic disease in the abdomen or pelvis on this noncontrast scan. 3. Simple and hyperdense renal cortical lesions in both kidneys, not substantially changed, previously characterized as Bosniak category 1 and 2 renal cysts on MRI. 4. Marked prostatomegaly. 5.  Aortic Atherosclerosis (ICD10-I70.0).     11/09/2019 Imaging   1. Postoperative changes of LEFT partial nephrectomy without signs of disease recurrence in this area. 2. There is a lesion in the lower-interpolar portion of the RIGHT kidney extending to the hilar line and just beyond that that measures approximately 1.8 x 1.9 cm previously approximately 1.4 x 1.1 cm. This could represent a small papillary renal neoplasm, MRI may be helpful for further assessment. 3. No signs of periampullary disease recurrence to the extent evaluated on CT. 4. Bilateral renal cysts some with proteinaceous and/or hemorrhagic features, otherwise not significantly changed. 5. Colonic diverticulosis. 6. Aortic atherosclerosis.   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 Postoperative changes of LEFT partial nephrectomy without signs of disease recurrence in this area. 2. There is a lesion in the lower-interpolar portion of the RIGHT  kidney extending to the hilar line and just beyond that that measures approximately 1.8 x 1.9 cm previously approximately 1.4 x 1.1 cm. This could represent a small papillary renal neoplasm, MRI may be helpful for further assessment. 3. No signs of periampullary disease recurrence to the extent evaluated on CT. 4. Bilateral renal cysts some with proteinaceous and/or hemorrhagic features, otherwise not significantly changed. 5. Colonic diverticulosis. 6. Aortic atherosclerosis.   11/23/2019 Imaging   1. Lesion in the interpolar RIGHT kidney biased towards the lower pole measures 2 x 1.7 cm. Areas of low level enhancement are present on delayed phase imaging. 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 Biopsy may be helpful to determine further management as clinically warranted. 2. Status post LEFT partial nephrectomy. 3. No evidence of metastatic disease in the abdomen. 4. Hepatic cysts.     PHYSICAL EXAMINATION: ECOG PERFORMANCE STATUS: 0 - Asymptomatic  Vitals:   12/06/20 1429  BP: (!) 148/70  Pulse: 64  Resp: 18  Temp: 98.6 F (37 C)  SpO2: 100%  Filed Weights   12/06/20 1429  Weight: 189 lb 6.4 oz (85.9 kg)    GENERAL:alert, no distress and comfortable SKIN: skin color, texture, turgor are normal, no rashes or significant lesions EYES: normal, Conjunctiva are pink and non-injected, sclera clear OROPHARYNX:no exudate, no erythema and lips, buccal mucosa, and tongue normal  NECK: supple, thyroid normal size, non-tender, without nodularity LYMPH:  no palpable lymphadenopathy in the cervical, axillary or inguinal LUNGS: clear to auscultation and percussion with normal breathing effort HEART: regular rate & rhythm and no murmurs and no lower extremity edema ABDOMEN:abdomen soft, non-tender and normal bowel sounds Musculoskeletal:no cyanosis of digits and no clubbing  NEURO: alert & oriented x 3 with fluent  speech, no focal motor/sensory deficits  LABORATORY DATA:  I have reviewed the data as listed    Component Value Date/Time   NA 141 12/06/2020 1403   NA 139 05/03/2019 0854   NA 140 10/16/2016 0816   K 4.4 12/06/2020 1403   K 4.1 10/16/2016 0816   CL 112 (H) 12/06/2020 1403   CO2 24 12/06/2020 1403   CO2 24 10/16/2016 0816   GLUCOSE 91 12/06/2020 1403   GLUCOSE 96 10/16/2016 0816   BUN 30 (H) 12/06/2020 1403   BUN 21 05/03/2019 0854   BUN 17.4 10/16/2016 0816   CREATININE 1.40 (H) 12/06/2020 1403   CREATININE 1.8 (H) 10/16/2016 0816   CALCIUM 9.2 12/06/2020 1403   CALCIUM 9.6 10/16/2016 0816   PROT 6.9 12/06/2020 1403   PROT 6.7 05/03/2019 0854   PROT 7.2 10/16/2016 0816   ALBUMIN 3.7 12/06/2020 1403   ALBUMIN 4.2 05/03/2019 0854   ALBUMIN 3.5 10/16/2016 0816   AST 16 12/06/2020 1403   AST 20 10/16/2016 0816   ALT 12 12/06/2020 1403   ALT 16 10/16/2016 0816   ALKPHOS 80 12/06/2020 1403   ALKPHOS 69 10/16/2016 0816   BILITOT 0.2 (L) 12/06/2020 1403   BILITOT 0.2 05/03/2019 0854   BILITOT 0.44 10/16/2016 0816   GFRNONAA 51 (L) 12/06/2020 1403   GFRAA 49 (L) 11/09/2019 1009    No results found for: SPEP, UPEP  Lab Results  Component Value Date   WBC 4.9 12/06/2020   NEUTROABS 2.5 12/06/2020   HGB 10.1 (L) 12/06/2020   HCT 30.6 (L) 12/06/2020   MCV 99.0 12/06/2020   PLT 235 12/06/2020      Chemistry      Component Value Date/Time   NA 141 12/06/2020 1403   NA 139 05/03/2019 0854   NA 140 10/16/2016 0816   K 4.4 12/06/2020 1403   K 4.1 10/16/2016 0816   CL 112 (H) 12/06/2020 1403   CO2 24 12/06/2020 1403   CO2 24 10/16/2016 0816   BUN 30 (H) 12/06/2020 1403   BUN 21 05/03/2019 0854   BUN 17.4 10/16/2016 0816   CREATININE 1.40 (H) 12/06/2020 1403   CREATININE 1.8 (H) 10/16/2016 0816      Component Value Date/Time   CALCIUM 9.2 12/06/2020 1403   CALCIUM 9.6 10/16/2016 0816   ALKPHOS 80 12/06/2020 1403   ALKPHOS 69 10/16/2016 0816   AST 16  12/06/2020 1403   AST 20 10/16/2016 0816   ALT 12 12/06/2020 1403   ALT 16 10/16/2016 0816   BILITOT 0.2 (L) 12/06/2020 1403   BILITOT 0.2 05/03/2019 0854   BILITOT 0.44 10/16/2016 0816

## 2020-12-14 ENCOUNTER — Telehealth: Payer: Self-pay

## 2020-12-14 NOTE — Telephone Encounter (Signed)
Called and left a message asking him to call the office back. Appt scheduled with Dr. Tresa Moore 11/22 at 1245, arrive 1230 to [facility].

## 2020-12-14 NOTE — Telephone Encounter (Signed)
He called back. Given appt details with Dr. Tresa Moore. He verbalized understanding.

## 2020-12-25 DIAGNOSIS — H401132 Primary open-angle glaucoma, bilateral, moderate stage: Secondary | ICD-10-CM | POA: Insufficient documentation

## 2020-12-25 DIAGNOSIS — H30031 Focal chorioretinal inflammation, peripheral, right eye: Secondary | ICD-10-CM | POA: Insufficient documentation

## 2021-06-16 IMAGING — MR MR ABDOMEN WO/W CM
18 series · 48 of 48 positions shown · IV contrast (gadavist)
Comparison: None.

CLINICAL DATA: Urologic cancer, surveillance of RIGHT renal mass
suggested.

EXAM:
MRI ABDOMEN WITHOUT AND WITH CONTRAST
TECHNIQUE: Multiplanar multisequence MR imaging of the abdomen was performed
both before and after the administration of intravenous contrast.
CONTRAST:  8mL GADAVIST GADOBUTROL 1 MMOL/ML IV SOLN

[Series 5: T2 · coronal · 6.0mm · 1.56mm/px · 2 of 37 slices shown (1 of 2)]
[im 1/37]
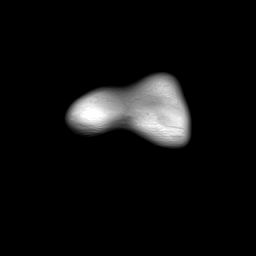
[im 37/37]
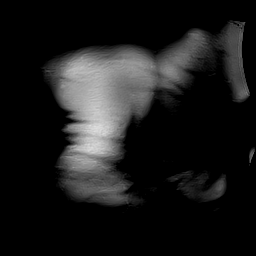

[Series 6: T2 fat-sat · axial · 6.0mm · 1.25mm/px · z∈[-21,+259]mm · 2 of 40 slices shown]
[im 1/40]
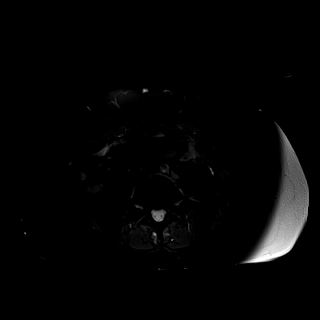
[im 40/40]
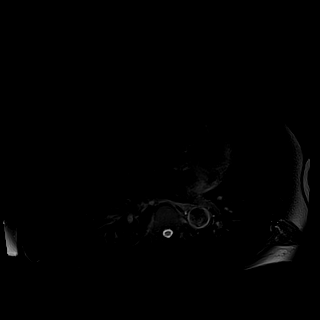

[Series 8: T1 · axial · 3.0mm · 1.25mm/px · z∈[-21,+264]mm · 4 of 96 slices shown (1 of 2)]
[im 1/96]
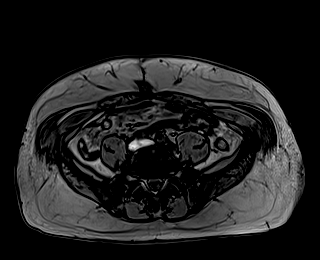
[im 32/96]
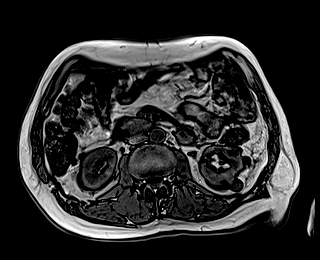
[im 64/96]
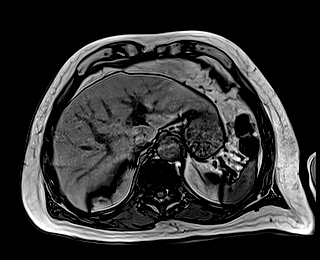
[im 96/96]
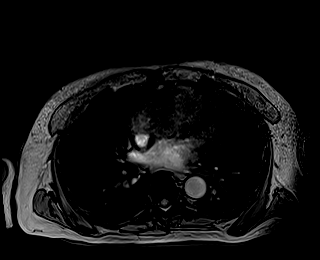

[Series 9: T1 · axial · 3.0mm · 1.25mm/px · z∈[-21,+264]mm · 4 of 96 slices shown (2 of 2)]
[im 1/96]
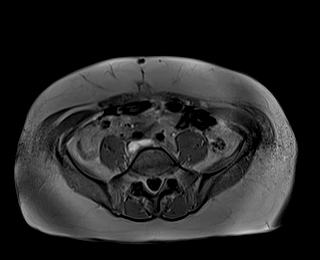
[im 32/96]
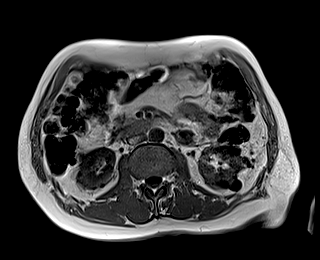
[im 64/96]
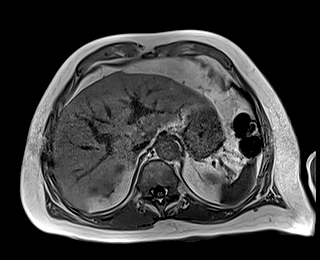
[im 96/96]
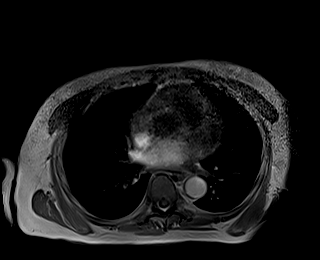

[Series 10: DWI · axial · 6.0mm · 1.49mm/px · z∈[-36,+274]mm · 4 of 88 slices shown (1 of 2)]
[im 1/88]
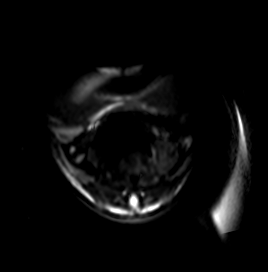
[im 30/88]
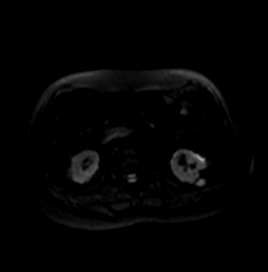
[im 59/88]
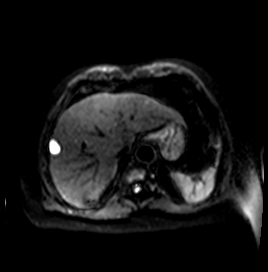
[im 88/88]
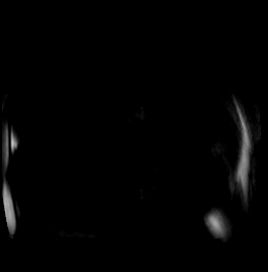

[Series 11: DWI · axial · 6.0mm · 1.49mm/px · z∈[-36,+274]mm · 2 of 44 slices shown (2 of 2)]
[im 1/44]
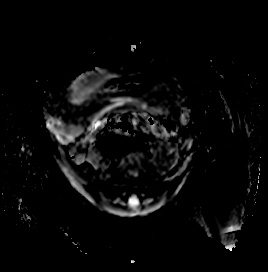
[im 44/44]
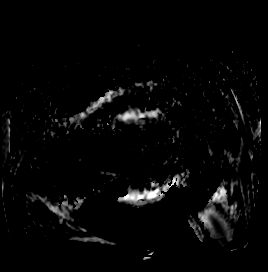

[Series 12: bSSFP · axial · 5.0mm · 0.84mm/px · z∈[-24,+267]mm · 2 of 54 slices shown]
[im 1/54]
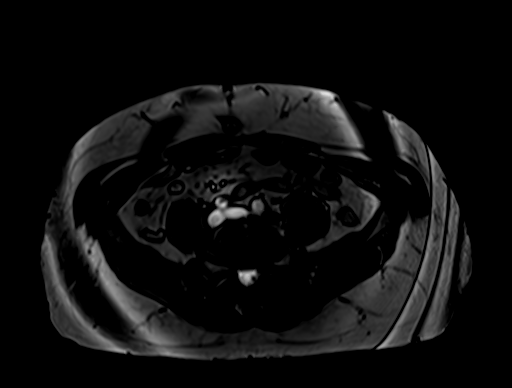
[im 54/54]
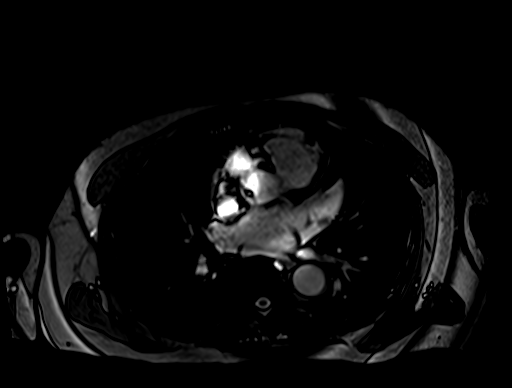

[Series 14: T1 dynamic · axial · 3.0mm · 1.25mm/px · z∈[-21,+264]mm · 3 of 96 slices shown (1 of 6)]
[im 1/96]
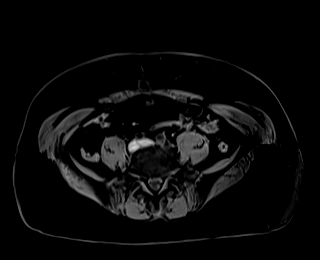
[im 48/96]
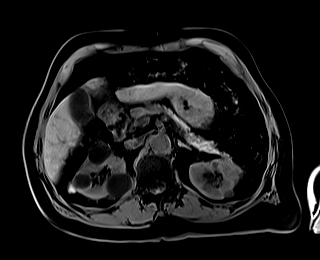
[im 96/96]
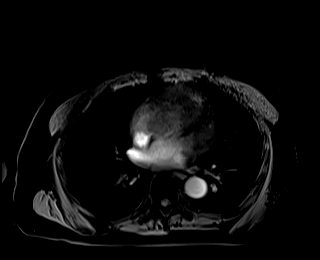

[Series 17: T1 dynamic · axial · 3.0mm · 1.25mm/px · z∈[-21,+264]mm · 3 of 96 slices shown (2 of 6)]
[im 1/96]
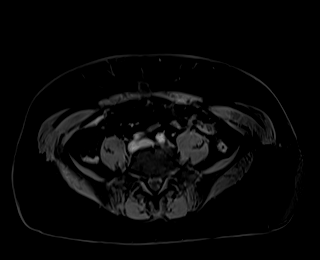
[im 48/96]
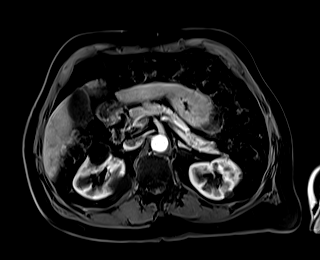
[im 96/96]
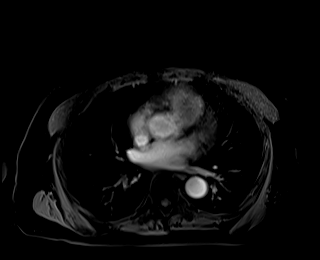

[Series 19: T1 dynamic · axial · 3.0mm · 1.25mm/px · z∈[-21,+264]mm · 3 of 96 slices shown (3 of 6)]
[im 1/96]
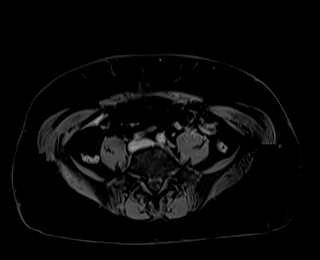
[im 48/96]
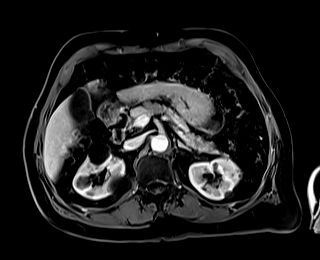
[im 96/96]
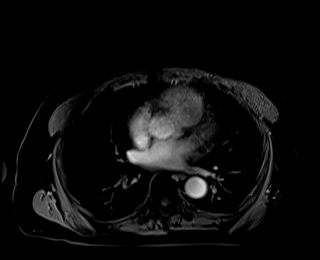

[Series 21: T1 dynamic · axial · 3.0mm · 1.25mm/px · z∈[-21,+264]mm · 3 of 96 slices shown (4 of 6)]
[im 1/96]
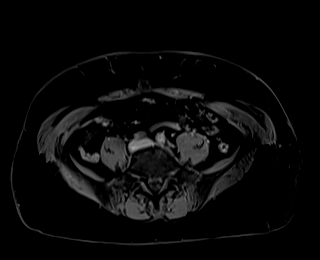
[im 48/96]
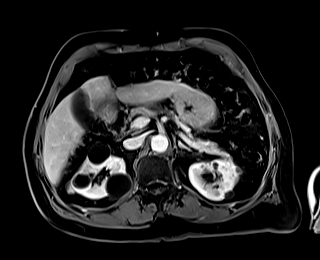
[im 96/96]
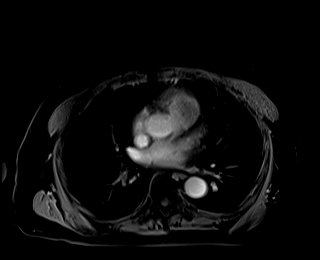

[Series 23: T1 dynamic · coronal · 5.0mm · 1.41mm/px · 2 of 56 slices shown (5 of 6)]
[im 1/56]
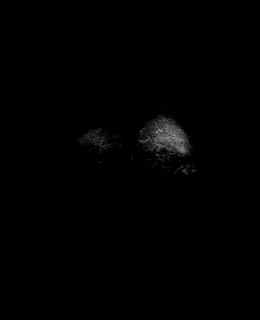
[im 56/56]
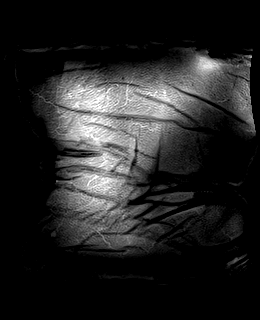

[Series 24: T2 · axial · 6.0mm · 1.56mm/px · 1 of 40 slices shown (2 of 2)]
[im 1/40]
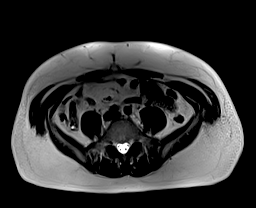

[Series 26: T1 dynamic · axial · 3.0mm · 1.25mm/px · z∈[-21,+264]mm · 3 of 96 slices shown (6 of 6)]
[im 1/96]
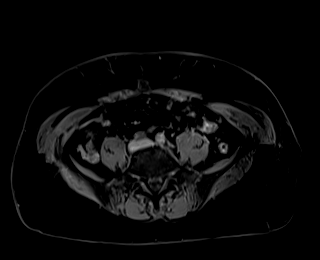
[im 48/96]
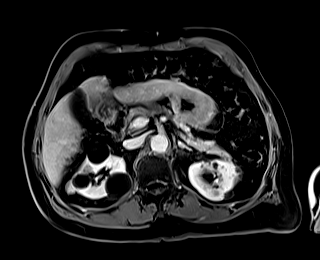
[im 96/96]
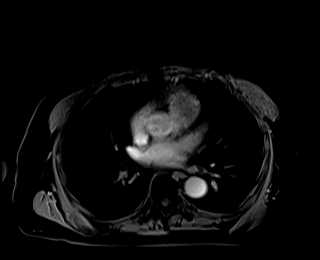

[Series 100: sub_(id) · axial · 3.0mm · 1.25mm/px · z∈[-21,+264]mm · 3 of 96 slices shown (1 of 3)]
[im 1/96]
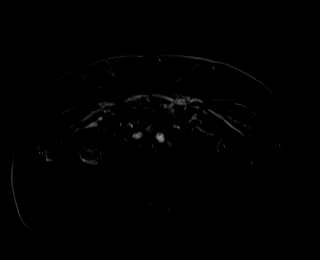
[im 48/96]
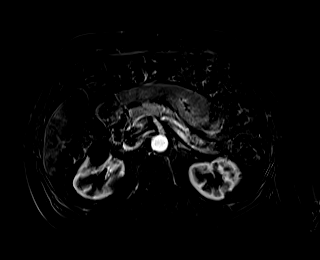
[im 96/96]
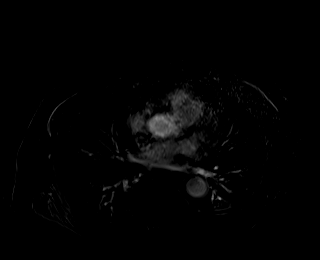

[Series 101: sub_(id) · axial · 3.0mm · 1.25mm/px · z∈[-21,+264]mm · 3 of 96 slices shown (2 of 3)]
[im 1/96]
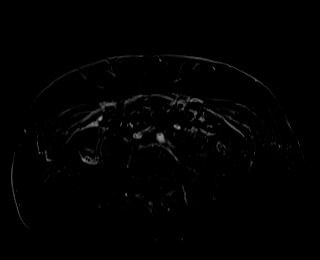
[im 48/96]
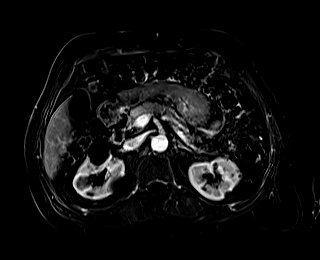
[im 96/96]
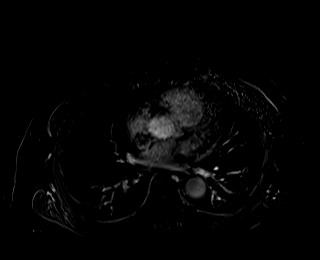

[Series 102: sub_(id) · axial · 3.0mm · 1.25mm/px · z∈[-12,+264]mm · 3 of 93 slices shown (3 of 3)]
[im 1/93]
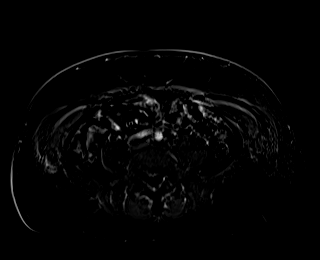
[im 47/93]
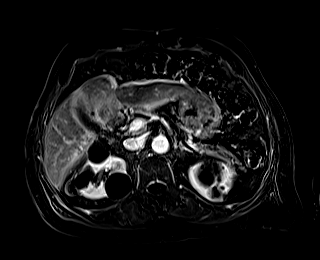
[im 93/93]
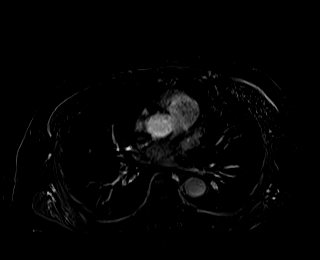

[Series 103: sub_delay · axial · 3.0mm · 1.25mm/px · 1 of 28 slices shown]
[im 1/28]
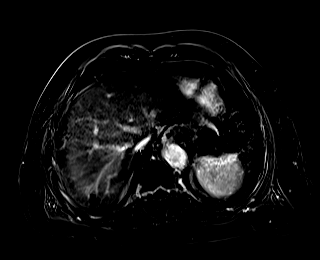

[48 of 48 positions shown; findings below may reference images not displayed]

FINDINGS: Lower chest: Incidental imaging of the lung bases on MRI is
unremarkable. No consolidation or effusion on limited assessment.

Hepatobiliary: Hepatic cysts similar to previous imaging. No
pericholecystic stranding. No biliary duct dilation.

Pancreas: Normal pancreatic signal without ductal dilation or sign
of inflammation.

Spleen:  Spleen normal in size and contour.

Adrenals/Urinary Tract:  Adrenal glands are normal.

Lesion in the interpolar RIGHT kidney biased towards the lower pole
measuring 2 x 1.7 cm showing T2 hypointensity, mildly heterogeneous
relative to the adjacent renal cortex. Rather than profound
hypointensity this shows intermediate T2 signal. Area is mixed
signal on T1.w following contrast administration signal intensity on
delayed phase imaging measured at 96, pre contrast signal intensity
of 69 heterogeneity and mild misregistration makes subtraction
somewhat challenging though subtraction suggests that this low level
enhancement is present this is best seen on image 62 of series 101 a

Signs of LEFT partial nephrectomy.

Hemorrhagic and simple cysts elsewhere in the kidneys with similar
appearance to recent imaging.

Stomach/Bowel: Limited assessment of gastrointestinal structures
without acute process.

Vascular/Lymphatic: Atheromatous plaque of the abdominal aorta,
nonaneurysmal. There is no gastrohepatic or hepatoduodenal ligament
lymphadenopathy. No retroperitoneal or mesenteric lymphadenopathy.

Other:  No ascites.

Musculoskeletal: No suspicious bone lesions identified.
IMPRESSION: 1. Lesion in the interpolar RIGHT kidney biased towards the lower
pole measures 2 x 1.7 cm. Areas of low level enhancement are present
on delayed phase imaging. Findings are suspicious for small
papillary renal cell carcinoma with very slow interval enlargement
over time. Biopsy may be helpful to determine further management as
clinically warranted.
2. Status post LEFT partial nephrectomy.
3. No evidence of metastatic disease in the abdomen.
4. Hepatic cysts.

## 2021-11-05 ENCOUNTER — Ambulatory Visit: Payer: Medicare HMO | Admitting: Urology

## 2021-11-05 ENCOUNTER — Other Ambulatory Visit: Payer: Self-pay | Admitting: Urology

## 2021-11-05 DIAGNOSIS — N138 Other obstructive and reflux uropathy: Secondary | ICD-10-CM

## 2021-12-03 ENCOUNTER — Other Ambulatory Visit: Payer: Medicare HMO

## 2021-12-03 DIAGNOSIS — R972 Elevated prostate specific antigen [PSA]: Secondary | ICD-10-CM

## 2021-12-04 LAB — PSA: Prostate Specific Ag, Serum: 2.4 ng/mL (ref 0.0–4.0)

## 2021-12-06 ENCOUNTER — Ambulatory Visit: Payer: Medicare HMO | Admitting: Hematology and Oncology

## 2021-12-06 ENCOUNTER — Other Ambulatory Visit: Payer: Medicare HMO

## 2021-12-10 ENCOUNTER — Encounter: Payer: Self-pay | Admitting: Urology

## 2021-12-10 ENCOUNTER — Ambulatory Visit (INDEPENDENT_AMBULATORY_CARE_PROVIDER_SITE_OTHER): Payer: Medicare HMO | Admitting: Urology

## 2021-12-10 VITALS — BP 122/63 | HR 63 | Ht 74.0 in | Wt 170.0 lb

## 2021-12-10 DIAGNOSIS — R972 Elevated prostate specific antigen [PSA]: Secondary | ICD-10-CM

## 2021-12-10 DIAGNOSIS — N5201 Erectile dysfunction due to arterial insufficiency: Secondary | ICD-10-CM | POA: Diagnosis not present

## 2021-12-10 NOTE — Progress Notes (Signed)
History of Present Illness: Patient here for follow-up of elevated PSA, erectile dysfunction and BPH.      Prior h/o elevated PSA with negative BX x 3 previously most recently 2006 at time TRUS 42m. PSA peak of 15.  2012 PSA 4.2 (avodart)  2017 PSA 4.4 (avodart) age 81  11.15.2017--3.0  11.12.2018--2.6 7.30.2019: PSA 2.4.  7.21.2020:  PSA 2.5.  7.1.2021--2.2 (finasteride correction 4.4) 9.6.2022: PSA 3.4 (correction 6.8) 10.10.2023:  2.4 (corrected value 4.8) IPSS 7  QoL score 2   2.26.2019--he is here today for reintroduction to prostaglandin injections.    7.30.2019: 30 mcg doses cause pain--he would like to decrease dose    7.21.2020: He reports mild satisfaction with injection use thus far, but has had some difficulties with sex still. He expresses that he wants to continue with this treatment.   10.10.2023: Here for routine follow-up.  He has had no real urinary issues over the past year.  [facility] [place] [facility] [place]resa Moorefor history of renal cell carcinoma.  He underwent left partial nephrectomy in August 2017.  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  He has small right renal masses that are being followed with active surveillance.  His next office visit with Dr. MTammi Klippelwill be in about 3 months.    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   Duodenom,    Glaucoma    both eyes   Hearing impairment    Hypercholesterolemia    Hypertension    Iron deficiency anemia 03/15/2015    Kidney lesion, native, left 07/12/2015   Lateral meniscus derangement 01/12/2013   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   Osteoarthritis of right knee 01/12/2013   Prostate enlargement    Renal cancer, left ([facility] dx'd 2017   Skin lesion of left leg 03/07/2015    Past Surgical History:  Procedure Laterality Date   colonoscopy     ESOPHAGOGASTRODUODENOSCOPY (EGD) WITH PROPOFOL N/A 06/08/2017   Procedure: ESOPHAGOGASTRODUODENOSCOPY (EGD) WITH PROPOFOL;  Surgeon: GGatha Mayer MD;  Location: [facility];  Service: Endoscopy;  Laterality: N/A;   EUS N/A 07/05/2015   Procedure: UPPER ENDOSCOPIC ULTRASOUND (EUS) LINEAR;  Surgeon: DMilus Banister MD;  Location: [facility];  Service: Endoscopy;  Laterality: N/A;   KNEE ARTHROSCOPY WITH LATERAL MENISECTOMY Right 01/21/2013   Procedure: KNEE ARTHROSCOPY WITH PARTIAL LATERAL MENISECTOMY;  Surgeon: SCarole Civil MD;  Location: [facility];  Service: Orthopedics;  Laterality: Right;   ORIF ANKLE DISLOCATION Right 15 yrs ago   ROBOTIC ASSITED PARTIAL NEPHRECTOMY Left 10/17/2015   Procedure: XI ROBOTIC ASSITED LEFT PARTIAL NEPHRECTOMY, Left Cyst Decortication, Left Renal Ultrasound;  Surgeon: TAlexis Frock MD;  Location: [facility];  Service: Urology;  Laterality: Left;    Home Medications:  Allergies as of 12/10/2021   No Known Allergies      Medication List        Accurate as of December 10, 2021  8:17 AM. If you have any questions, ask your nurse  or doctor.          ARTIFICIAL TEAR OINTMENT OP Place 1 application into both eyes daily as needed (For dry eyes.).   aspirin EC 81 MG tablet Take 81 mg by mouth daily.   b complex vitamins capsule Take 1 capsule by mouth daily.   diclofenac Sodium 1 % Gel Commonly known as: VOLTAREN SMARTSIG:Sparingly Topical 3 Times Daily PRN   dorzolamide 2 % ophthalmic solution Commonly known as: TRUSOPT Place 1 drop into both eyes 2 (two) times daily.   dorzolamide-timolol 22.3-6.8 MG/ML  ophthalmic solution Commonly known as: COSOPT Place 1 drop into both eyes 2 (two) times daily.   finasteride 5 MG tablet Commonly known as: PROSCAR TAKE 1 TABLET BY MOUTH EVERY EVENING   FISH OIL PO Take 4 capsules by mouth daily.   hydrocortisone 2.5 % cream Apply topically 2 (two) times daily.   hydrocortisone 25 MG suppository Commonly known as: ANUSOL-HC Place 25 mg rectally daily as needed for hemorrhoids or anal itching.   indomethacin 25 MG capsule Commonly known as: INDOCIN Take 25 mg by mouth 2 (two) times daily with a meal.   lanolin ointment Apply topically as needed (apply to eye).   lisinopril 20 MG tablet Commonly known as: ZESTRIL Take 20 mg by mouth every morning.   multivitamins ther. w/minerals Tabs tablet Take 1 tablet by mouth daily.   nitroGLYCERIN 0.4 MG SL tablet Commonly known as: NITROSTAT Place 1 tablet (0.4 mg total) under the tongue every 5 (five) minutes as needed for chest pain.   omeprazole 20 MG capsule Commonly known as: PRILOSEC Take 20 mg by mouth daily.   rosuvastatin 10 MG tablet Commonly known as: CRESTOR Take 1 tablet (10 mg total) by mouth daily. Please schedule yearly appointment for future refills. Thank you   travoprost (benzalkonium) 0.004 % ophthalmic solution Commonly known as: TRAVATAN Place 1 drop into both eyes at bedtime.   triamcinolone cream 0.1 % Commonly known as: KENALOG Apply 1 application topically 2 (two) times daily.        Allergies: No Known Allergies  Family History  Problem Relation Age of Onset   Hypertension Father     Social History:  reports that he quit smoking about 40 years ago. His smoking use included cigarettes. He has a 20.00 pack-year smoking history. He has never used smokeless tobacco. He reports that he does not drink alcohol and does not use drugs.  ROS: A complete review of systems was performed.  All systems are negative except for pertinent findings as noted.  Physical  Exam:  Vital signs in last 24 hours: There were no vitals taken for this visit. Constitutional:  Alert and oriented, No acute distress Cardiovascular: Regular rate  Respiratory: Normal respiratory effort Neurologic: Grossly intact, no focal deficits Psychiatric: Normal mood and affect  I have reviewed prior pt notes  I have reviewed notes from referring/previous physicians-[facility] notes reviewed  I have reviewed urinalysis results  I have independently reviewed prior imaging  I have reviewed prior PSA results    Impression/Assessment:  History of elevated PSA with negative biopsies x3.  PSA curve is stable.  I do not think he needs further PSA checks  BPH-he is doing well with finasteride  Plan:  I offered to have him just follow-up on his as-needed basis.  However, he would like to come back to see [place] on an annual basis

## 2021-12-11 LAB — MICROSCOPIC EXAMINATION
Bacteria, UA: NONE SEEN
Epithelial Cells (non renal): NONE SEEN /hpf (ref 0–10)

## 2021-12-11 LAB — URINALYSIS, ROUTINE W REFLEX MICROSCOPIC
Bilirubin, UA: NEGATIVE
Glucose, UA: NEGATIVE
Ketones, UA: NEGATIVE
Leukocytes,UA: NEGATIVE
Nitrite, UA: NEGATIVE
Protein,UA: NEGATIVE
Specific Gravity, UA: 1.015 (ref 1.005–1.030)
Urobilinogen, Ur: 0.2 mg/dL (ref 0.2–1.0)
pH, UA: 7 (ref 5.0–7.5)

## 2022-01-06 ENCOUNTER — Other Ambulatory Visit: Payer: Self-pay | Admitting: Hematology and Oncology

## 2022-01-06 DIAGNOSIS — C642 Malignant neoplasm of left kidney, except renal pelvis: Secondary | ICD-10-CM

## 2022-01-06 DIAGNOSIS — N1831 Chronic kidney disease, stage 3a: Secondary | ICD-10-CM

## 2022-01-06 DIAGNOSIS — C49A3 Gastrointestinal stromal tumor of small intestine: Secondary | ICD-10-CM

## 2022-01-06 DIAGNOSIS — D500 Iron deficiency anemia secondary to blood loss (chronic): Secondary | ICD-10-CM

## 2022-01-07 ENCOUNTER — Other Ambulatory Visit: Payer: Self-pay

## 2022-01-07 ENCOUNTER — Inpatient Hospital Stay: Payer: Medicare HMO | Admitting: Hematology and Oncology

## 2022-01-07 ENCOUNTER — Telehealth: Payer: Self-pay

## 2022-01-07 ENCOUNTER — Inpatient Hospital Stay: Payer: Medicare HMO | Attending: Hematology and Oncology

## 2022-01-07 VITALS — BP 126/62 | HR 61 | Temp 97.9°F | Resp 18 | Ht 74.0 in | Wt 183.0 lb

## 2022-01-07 DIAGNOSIS — Z8500 Personal history of malignant neoplasm of unspecified digestive organ: Secondary | ICD-10-CM | POA: Diagnosis not present

## 2022-01-07 DIAGNOSIS — C642 Malignant neoplasm of left kidney, except renal pelvis: Secondary | ICD-10-CM | POA: Diagnosis not present

## 2022-01-07 DIAGNOSIS — Z79899 Other long term (current) drug therapy: Secondary | ICD-10-CM | POA: Insufficient documentation

## 2022-01-07 DIAGNOSIS — Z85528 Personal history of other malignant neoplasm of kidney: Secondary | ICD-10-CM | POA: Insufficient documentation

## 2022-01-07 DIAGNOSIS — D500 Iron deficiency anemia secondary to blood loss (chronic): Secondary | ICD-10-CM

## 2022-01-07 DIAGNOSIS — D539 Nutritional anemia, unspecified: Secondary | ICD-10-CM | POA: Insufficient documentation

## 2022-01-07 DIAGNOSIS — Z905 Acquired absence of kidney: Secondary | ICD-10-CM | POA: Diagnosis not present

## 2022-01-07 DIAGNOSIS — C49A3 Gastrointestinal stromal tumor of small intestine: Secondary | ICD-10-CM

## 2022-01-07 DIAGNOSIS — N1831 Chronic kidney disease, stage 3a: Secondary | ICD-10-CM

## 2022-01-07 LAB — CMP (CANCER CENTER ONLY)
ALT: 12 U/L (ref 0–44)
AST: 19 U/L (ref 15–41)
Albumin: 3.9 g/dL (ref 3.5–5.0)
Alkaline Phosphatase: 58 U/L (ref 38–126)
Anion gap: 5 (ref 5–15)
BUN: 29 mg/dL — ABNORMAL HIGH (ref 8–23)
CO2: 27 mmol/L (ref 22–32)
Calcium: 9.3 mg/dL (ref 8.9–10.3)
Chloride: 107 mmol/L (ref 98–111)
Creatinine: 1.51 mg/dL — ABNORMAL HIGH (ref 0.61–1.24)
GFR, Estimated: 46 mL/min — ABNORMAL LOW (ref >60)
Glucose, Bld: 87 mg/dL (ref 70–99)
Potassium: 4.4 mmol/L (ref 3.5–5.1)
Sodium: 139 mmol/L (ref 135–145)
Total Bilirubin: 0.5 mg/dL (ref 0.3–1.2)
Total Protein: 6.9 g/dL (ref 6.5–8.1)

## 2022-01-07 LAB — CBC WITH DIFFERENTIAL (CANCER CENTER ONLY)
Abs Immature Granulocytes: 0.01 10*3/uL (ref 0.00–0.07)
Basophils Absolute: 0 10*3/uL (ref 0.0–0.1)
Basophils Relative: 0 %
Eosinophils Absolute: 0.1 10*3/uL (ref 0.0–0.5)
Eosinophils Relative: 3 %
HCT: 29.7 % — ABNORMAL LOW (ref 39.0–52.0)
Hemoglobin: 10.5 g/dL — ABNORMAL LOW (ref 13.0–17.0)
Immature Granulocytes: 0 %
Lymphocytes Relative: 18 %
Lymphs Abs: 0.9 10*3/uL (ref 0.7–4.0)
MCH: 38.3 pg — ABNORMAL HIGH (ref 26.0–34.0)
MCHC: 35.4 g/dL (ref 30.0–36.0)
MCV: 108.4 fL — ABNORMAL HIGH (ref 80.0–100.0)
Monocytes Absolute: 0.5 10*3/uL (ref 0.1–1.0)
Monocytes Relative: 10 %
Neutro Abs: 3.3 10*3/uL (ref 1.7–7.7)
Neutrophils Relative %: 69 %
Platelet Count: 207 10*3/uL (ref 150–400)
RBC: 2.74 MIL/uL — ABNORMAL LOW (ref 4.22–5.81)
RDW: 13.8 % (ref 11.5–15.5)
WBC Count: 4.8 10*3/uL (ref 4.0–10.5)
nRBC: 0 % (ref 0.0–0.2)

## 2022-01-07 LAB — VITAMIN B12: Vitamin B-12: 800 pg/mL (ref 180–914)

## 2022-01-07 LAB — FERRITIN: Ferritin: 219 ng/mL (ref 24–336)

## 2022-01-07 LAB — IRON AND IRON BINDING CAPACITY (CC-WL,HP ONLY)
Iron: 74 ug/dL (ref 45–182)
Saturation Ratios: 29 % (ref 17.9–39.5)
TIBC: 259 ug/dL (ref 250–450)
UIBC: 185 ug/dL (ref 117–376)

## 2022-01-07 NOTE — Telephone Encounter (Signed)
-----   Message from [name], MD sent at 01/07/2022  2:34 PM EST ----- Pls call and let him know iron and B12 levels are good

## 2022-01-07 NOTE — Telephone Encounter (Signed)
Called and given below message. He verbalized understanding. 

## 2022-01-08 ENCOUNTER — Encounter: Payer: Self-pay | Admitting: Hematology and Oncology

## 2022-01-08 NOTE — Assessment & Plan Note (Signed)
His last abdominal imaging was in 2021 that I could see on electronic records but according to the patient, he has seen Dr. Tresa Moore in 2022 with negative surveillance imaging He is due for another appointment this year I gave the patient telephone number to call to set up follow-up visit

## 2022-01-08 NOTE — Assessment & Plan Note (Signed)
He has persistent anemia likely due to anemia of chronic kidney disease Serum vitamin B12 and iron studies are within normal range

## 2022-01-08 NOTE — Assessment & Plan Note (Signed)
His last CT imaging showed no evidence of disease within the stomach region Observe only for now from the GIST standpoint We can order repeat EGD in 2024

## 2022-01-08 NOTE — Progress Notes (Signed)
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 Tresa Moore in 2022 with negative surveillance imaging He is due for another appointment this year I gave the patient telephone number to call to set up follow-up visit   Orders Placed This Encounter  Procedures   Vitamin B12    Standing Status:   Future    Number of Occurrences:   1    Standing Expiration Date:   01/08/2023    All questions were answered. The patient knows to call the clinic with any problems, questions or concerns. The total time spent in the appointment was 25 minutes encounter with patients including review of chart and various tests results, discussions about plan of care and coordination of care plan   [name], MD 01/08/2022 8:28 AM  INTERVAL HISTORY: Please see below for problem oriented charting. he returns for surveillance follow-up for resected malignant GIST tumor of the stomach as well as kidney  cancer He is doing well Denies abdominal pain No recent hematuria, melena or hematochezia He has not seen urologist this year  REVIEW OF SYSTEMS:   Constitutional: Denies fevers, chills or abnormal weight loss Eyes: Denies blurriness of vision Ears, nose, mouth, throat, and face: Denies mucositis or sore throat Respiratory: Denies cough, dyspnea or wheezes Cardiovascular: Denies palpitation, chest discomfort or lower extremity swelling Gastrointestinal:  Denies nausea, heartburn or change in bowel habits Skin: Denies abnormal skin rashes Lymphatics: Denies new lymphadenopathy or easy bruising Neurological:Denies numbness, tingling or new weaknesses Behavioral/Psych: Mood is stable, no new changes  All other systems were reviewed with the patient and are negative.  I have reviewed the past medical history, past surgical history, social history and family history with the patient and they are unchanged from previous note.  ALLERGIES:  has No Known Allergies.  MEDICATIONS:  Current Outpatient Medications  Medication Sig Dispense Refill   ARTIFICIAL TEAR OINTMENT OP Place 1 application into both eyes daily as needed (For dry eyes.).      aspirin EC 81 MG tablet Take 81 mg by mouth daily.      b complex vitamins capsule Take 1 capsule by mouth daily.      dorzolamide (TRUSOPT) 2 % ophthalmic solution Place 1 drop into both eyes 2 (two) times daily.  (Patient not taking: Reported on 12/10/2021)     dorzolamide-timolol (COSOPT) 22.3-6.8 MG/ML ophthalmic solution Place 1 drop into both eyes 2 (two) times daily.      finasteride (PROSCAR) 5 MG tablet TAKE 1 TABLET BY MOUTH EVERY EVENING 90 tablet 3  hydrocortisone (ANUSOL-HC) 25 MG suppository Place 25 mg rectally daily as needed for hemorrhoids or anal itching.      lanolin ointment Apply topically as needed (apply to eye).      lisinopril (PRINIVIL,ZESTRIL) 20 MG tablet Take 20 mg by mouth every morning.      Multiple Vitamins-Minerals  (MULTIVITAMINS THER. W/MINERALS) TABS tablet Take 1 tablet by mouth daily.      nitroGLYCERIN (NITROSTAT) 0.4 MG SL tablet Place 1 tablet (0.4 mg total) under the tongue every 5 (five) minutes as needed for chest pain. 25 tablet 3   Omega-3 Fatty Acids (FISH OIL PO) Take 4 capsules by mouth daily.      omeprazole (PRILOSEC) 20 MG capsule Take 20 mg by mouth daily.      rosuvastatin (CRESTOR) 10 MG tablet Take 1 tablet (10 mg total) by mouth daily. Please schedule yearly appointment for future refills. Thank you 30 tablet 0   travoprost, benzalkonium, (TRAVATAN) 0.004 % ophthalmic solution Place 1 drop into both eyes at bedtime.      No current facility-administered medications for this visit.    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    06/29/2015 Imaging   MRI abdomen showed 1.9 cm enhancing lesion along the lateral left lower kidney, corresponding to the CT abnormality, suspicious for solid renal neoplasm such as renal cell carcinoma   07/05/2015 Procedure   EUS confirmed 2.6 cm mass opposite opening of the ampulla   07/05/2015 Pathology Results   Accession: [id number] FNA was positive for GIST   10/17/2015 Surgery   He underwent combined stomach resection and kidney resection   10/17/2015 Pathology Results   Accession: [id number] pathology from stomach revealed 3.5 cm GIST with low mitotic rate but positive margin. He also  has papillary cell carcinoma involving the kidney   10/16/2016 Imaging   1. Status post resection of a duodenum GIST. No findings for residual or recurrent tumor or metastatic disease. 2. Status post partial left nephrectomy for small renal cell carcinoma. No findings for recurrent tumor or metastatic disease. 3. Numerous bilateral renal cysts, some of which are hyperdense/hemorrhagic. No worrisome renal lesions. 4. Markedly enlarged prostate gland. 5. Small pericardial effusion and scattered mediastinal lymph nodes but no mass or overt adenopathy. 6. Benign-appearing hepatic cysts.   02/05/2017 Imaging   Ct scan of abdomen and pelvis showed no evidence of recurrent kidney cancer or GIST tumor. Probable small bowel enteritis   06/08/2017 Pathology Results   Duodenum, Biopsy - PEPTIC DUODENITIS - NO DYSPLASIA OR MALIGNANCY IDENTIFIED   06/08/2017 Procedure   There was evidence of a patent previous surgical anastomosis in the first portion of the duodenum. This was characterized by healthy appearing mucosa. Biopsies were taken with a cold forceps for histology. Verification of patient identification for the specimen was done. Estimated blood loss was minimal. Findings: A non-obstructing Schatzki ring was found at the gastroesophageal junction. A small hiatal hernia was present. The cardia and  gastric fundus were normal on retroflexion. The exam was otherwise without abnormality. - Patent previous surgical anastomosis, characterized by healthy appearing mucosa was found in the duodenum. Biopsied. Was not photogtraphed - Non-obstructing Schatzki ring. - Small hiatal hernia. - The examination was otherwise   11/04/2018 Imaging   1. No evidence of recurrent tumor in the periampullary duodenum or in the partial nephrectomy bed in the lower left kidney on this noncontrast scan. 2. No findings of metastatic disease in the abdomen or pelvis on this noncontrast scan. 3. Simple and hyperdense renal  cortical lesions in both kidneys, not substantially changed, previously characterized as Bosniak category 1 and 2 renal cysts on MRI. 4. Marked prostatomegaly. 5.  Aortic Atherosclerosis (ICD10-I70.0).     11/09/2019 Imaging   1. Postoperative changes of LEFT partial nephrectomy without signs of disease recurrence in this area. 2. There is a lesion in the lower-interpolar portion of the RIGHT kidney extending to the hilar line and just beyond that that measures approximately 1.8 x 1.9 cm previously approximately 1.4 x 1.1 cm. This could represent a small papillary renal neoplasm, MRI may be helpful for further assessment. 3. No signs of periampullary disease recurrence to the extent evaluated on CT. 4. Bilateral renal cysts some with proteinaceous and/or hemorrhagic features, otherwise not significantly changed. 5. Colonic diverticulosis. 6. Aortic atherosclerosis.   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 Postoperative changes of LEFT partial nephrectomy without signs of disease recurrence in this area. 2. There is a lesion in the lower-interpolar portion of the RIGHT kidney extending to the hilar line and just beyond that that measures approximately 1.8 x 1.9 cm previously approximately 1.4 x 1.1 cm. This could represent a small papillary renal neoplasm, MRI may be helpful for further assessment. 3. No signs of periampullary disease recurrence to the extent evaluated on CT. 4. Bilateral renal cysts some with proteinaceous and/or hemorrhagic features, otherwise not significantly changed. 5. Colonic diverticulosis. 6. Aortic atherosclerosis.   11/23/2019 Imaging   1. Lesion in the interpolar RIGHT kidney biased towards the lower pole measures  2 x 1.7 cm. Areas of low level enhancement are present on delayed phase imaging. 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 Biopsy may be helpful to determine further management as clinically warranted. 2. Status post LEFT partial nephrectomy. 3. No evidence of metastatic disease in the abdomen. 4. Hepatic cysts.     PHYSICAL EXAMINATION: ECOG PERFORMANCE STATUS: 1 - Symptomatic but completely ambulatory  Vitals:   01/07/22 1050  BP: 126/62  Pulse: 61  Resp: 18  Temp: 97.9 F (36.6 C)  SpO2: 100%   Filed Weights   01/07/22 1050  Weight: 183 lb (83 kg)    GENERAL:alert, no distress and comfortable SKIN: skin color, texture, turgor are normal, no rashes or significant lesions EYES: normal, Conjunctiva are pink and non-injected, sclera clear OROPHARYNX:no exudate, no erythema and lips, buccal mucosa, and tongue normal  NECK: supple, thyroid normal size, non-tender, without nodularity LYMPH:  no palpable lymphadenopathy in the cervical, axillary or inguinal LUNGS: clear to auscultation and percussion with normal breathing effort HEART: regular rate & rhythm and no murmurs and no lower extremity edema ABDOMEN:abdomen soft, non-tender and normal bowel sounds  Musculoskeletal:no cyanosis of digits and no clubbing  NEURO: alert & oriented x 3 with fluent speech, no focal motor/sensory deficits  LABORATORY DATA:  I have reviewed the data as listed    Component Value Date/Time   NA 139 01/07/2022 1041   NA 139 05/03/2019 0854   NA 140 10/16/2016 0816   K 4.4 01/07/2022 1041   K 4.1 10/16/2016 0816   CL 107 01/07/2022 1041   CO2 27 01/07/2022 1041   CO2 24 10/16/2016 0816   GLUCOSE 87 01/07/2022 1041   GLUCOSE 96 10/16/2016 0816   BUN 29 (H) 01/07/2022 1041   BUN 21 05/03/2019 0854   BUN 17.4 10/16/2016 0816   CREATININE 1.51 (H) 01/07/2022 1041   CREATININE 1.8 (H) 10/16/2016 0816   CALCIUM 9.3 01/07/2022  1041   CALCIUM 9.6 10/16/2016 0816   PROT 6.9 01/07/2022 1041   PROT 6.7 05/03/2019 0854   PROT 7.2 10/16/2016 0816   ALBUMIN 3.9 01/07/2022 1041   ALBUMIN 4.2 05/03/2019 0854   ALBUMIN 3.5 10/16/2016 0816   AST 19 01/07/2022 1041   AST 20 10/16/2016 0816   ALT 12 01/07/2022 1041   ALT 16 10/16/2016 0816   ALKPHOS 58 01/07/2022 1041   ALKPHOS 69 10/16/2016 0816   BILITOT 0.5 01/07/2022 1041   BILITOT 0.44 10/16/2016 0816   GFRNONAA 46 (L) 01/07/2022 1041   GFRAA 49 (L) 11/09/2019 1009    No results found for: "SPEP", "UPEP"  Lab Results  Component Value Date   WBC 4.8 01/07/2022   NEUTROABS 3.3 01/07/2022   HGB 10.5 (L) 01/07/2022   HCT 29.7 (L) 01/07/2022   MCV 108.4 (H) 01/07/2022   PLT 207 01/07/2022      Chemistry      Component Value Date/Time   NA 139 01/07/2022 1041   NA 139 05/03/2019 0854   NA 140 10/16/2016 0816   K 4.4 01/07/2022 1041   K 4.1 10/16/2016 0816   CL 107 01/07/2022 1041   CO2 27 01/07/2022 1041   CO2 24 10/16/2016 0816   BUN 29 (H) 01/07/2022 1041   BUN 21 05/03/2019 0854   BUN 17.4 10/16/2016 0816   CREATININE 1.51 (H) 01/07/2022 1041   CREATININE 1.8 (H) 10/16/2016 0816      Component Value Date/Time   CALCIUM 9.3 01/07/2022 1041   CALCIUM 9.6 10/16/2016 0816   ALKPHOS 58 01/07/2022 1041   ALKPHOS 69 10/16/2016 0816   AST 19 01/07/2022 1041   AST 20 10/16/2016 0816   ALT 12 01/07/2022 1041   ALT 16 10/16/2016 0816   BILITOT 0.5 01/07/2022 1041   BILITOT 0.44 10/16/2016 0816

## 2022-02-07 ENCOUNTER — Other Ambulatory Visit: Payer: Self-pay | Admitting: Urology

## 2022-02-07 DIAGNOSIS — C642 Malignant neoplasm of left kidney, except renal pelvis: Secondary | ICD-10-CM

## 2022-02-07 DIAGNOSIS — C641 Malignant neoplasm of right kidney, except renal pelvis: Secondary | ICD-10-CM

## 2022-03-18 ENCOUNTER — Other Ambulatory Visit: Payer: Medicare HMO

## 2022-03-29 ENCOUNTER — Ambulatory Visit
Admission: RE | Admit: 2022-03-29 | Discharge: 2022-03-29 | Disposition: A | Discharge status: Home / Self Care | Payer: Medicare HMO | Source: Ambulatory Visit | Attending: Urology | Admitting: Urology

## 2022-03-29 DIAGNOSIS — C642 Malignant neoplasm of left kidney, except renal pelvis: Secondary | ICD-10-CM

## 2022-03-29 DIAGNOSIS — C641 Malignant neoplasm of right kidney, except renal pelvis: Secondary | ICD-10-CM

## 2022-03-29 MED ORDER — GADOPICLENOL 0.5 MMOL/ML IV SOLN
7.0000 mL | Freq: Once | INTRAVENOUS | Status: AC | PRN
  Administered 2022-03-29: 7 mL via INTRAVENOUS

## 2022-10-08 DIAGNOSIS — I739 Peripheral vascular disease, unspecified: Secondary | ICD-10-CM | POA: Insufficient documentation

## 2022-12-11 NOTE — Progress Notes (Signed)
History of Present Illness: Patient here for follow-up of elevated PSA, erectile dysfunction and BPH.      Prior h/o elevated PSA with negative BX x 3 previously most recently 2006 at time TRUS 63mL. PSA peak of 15.  2012 PSA 4.2 (avodart)  2017 PSA 4.4 (avodart) age 82  11.15.2017--3.0  11.12.2018--2.6 7.30.2019: PSA 2.4.  7.21.2020:  PSA 2.5.  7.1.2021--2.2 (finasteride correction 4.4) 9.6.2022: PSA 3.4 (correction 6.8) 10.10.2023:  2.4 (corrected value 4.8) IPSS 7  QoL score 2   2.26.2019--he is here today for reintroduction to prostaglandin injections.    7.30.2019: 30 mcg doses cause pain--he would like to decrease dose    7.21.2020: He reports mild satisfaction with injection use thus far, but has had some difficulties with sex still. He expresses that he wants to continue with this treatment.   He is followed @ [facility] by Dr. Berneice Heinrich for history of renal cell carcinoma.  He underwent left partial nephrectomy in August 2017.  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  He has small right renal masses that are being followed with active surveillance.  His next office visit with Dr. Kathrynn Running will be in about 3 months.   10.15.2024: No real GU issues over the past year. He has local recurrence of his RCCa--followed yearly.   IPSS 6/2 he denies any infection over the past year.  Biggest complaint is related to his eyes and glaucoma.  Past Medical History:  Diagnosis Date   Adenomatous colon polyp 09/21/2014   Anemia in chronic illness 03/07/2015   being evaluated   Arthritis    Atrial flutter (HCC)    Bilateral hearing loss 03/15/2015   Cancer of intestine (HCC)    Chronic kidney disease (CKD) 03/07/2015   Chronic kidney disease, stage III (moderate) (HCC) 03/07/2015   Chronic right shoulder pain 02/03/2018   GERD (gastroesophageal reflux disease)    GIST (gastrointestinal stroma tumor), malignant, colon (HCC) dx'd 2017   Duodenom,    Glaucoma    both eyes    Hearing impairment    Hypercholesterolemia    Hypertension    Iron deficiency anemia 03/15/2015   Kidney lesion, native, left 07/12/2015   Lateral meniscus derangement 01/12/2013   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   Osteoarthritis of right knee 01/12/2013   Prostate enlargement    Renal cancer, left (HCC) dx'd 2017   Skin lesion of left leg 03/07/2015    Past Surgical History:  Procedure Laterality Date   colonoscopy     ESOPHAGOGASTRODUODENOSCOPY (EGD) WITH PROPOFOL N/A 06/08/2017   Procedure: ESOPHAGOGASTRODUODENOSCOPY (EGD) WITH PROPOFOL;  Surgeon: Iva Boop, MD;  Location: [facility];  Service: Endoscopy;  Laterality: N/A;   EUS N/A 07/05/2015   Procedure: UPPER ENDOSCOPIC ULTRASOUND (EUS) LINEAR;  Surgeon: Rachael Fee, MD;  Location: [facility];  Service: Endoscopy;  Laterality: N/A;   KNEE ARTHROSCOPY WITH LATERAL MENISECTOMY Right 01/21/2013   Procedure: KNEE ARTHROSCOPY WITH PARTIAL LATERAL MENISECTOMY;  Surgeon: Vickki Hearing, MD;  Location: [facility];  Service: Orthopedics;  Laterality: Right;   ORIF ANKLE DISLOCATION Right 15 yrs ago   ROBOTIC ASSITED PARTIAL NEPHRECTOMY Left 10/17/2015   Procedure: XI ROBOTIC ASSITED LEFT PARTIAL NEPHRECTOMY, Left Cyst Decortication, Left Renal Ultrasound;  Surgeon: Sebastian Ache, MD;  Location: [facility];  Service: Urology;  Laterality: Left;    Home Medications:  Allergies as of 12/16/2022   No Known Allergies      Medication List  Accurate as of December 11, 2022  2:41 PM. If you have any questions, ask your nurse or doctor.          ARTIFICIAL TEAR OINTMENT OP Place 1 application into both eyes daily as needed (For dry eyes.).   aspirin EC 81 MG tablet Take 81 mg by mouth daily.   b complex vitamins capsule Take 1 capsule by mouth daily.   dorzolamide 2 % ophthalmic solution Commonly known as: TRUSOPT Place 1 drop into both eyes 2 (two) times daily.   dorzolamide-timolol 2-0.5 % ophthalmic  solution Commonly known as: COSOPT Place 1 drop into both eyes 2 (two) times daily.   finasteride 5 MG tablet Commonly known as: PROSCAR TAKE 1 TABLET BY MOUTH EVERY EVENING   FISH OIL PO Take 4 capsules by mouth daily.   hydrocortisone 25 MG suppository Commonly known as: ANUSOL-HC Place 25 mg rectally daily as needed for hemorrhoids or anal itching.   lanolin ointment Apply topically as needed (apply to eye).   lisinopril 20 MG tablet Commonly known as: ZESTRIL Take 20 mg by mouth every morning.   multivitamins ther. w/minerals Tabs tablet Take 1 tablet by mouth daily.   nitroGLYCERIN 0.4 MG SL tablet Commonly known as: NITROSTAT Place 1 tablet (0.4 mg total) under the tongue every 5 (five) minutes as needed for chest pain.   omeprazole 20 MG capsule Commonly known as: PRILOSEC Take 20 mg by mouth daily.   rosuvastatin 10 MG tablet Commonly known as: CRESTOR Take 1 tablet (10 mg total) by mouth daily. Please schedule yearly appointment for future refills. Thank you   travoprost (benzalkonium) 0.004 % ophthalmic solution Commonly known as: TRAVATAN Place 1 drop into both eyes at bedtime.        Allergies: No Known Allergies  Family History  Problem Relation Age of Onset   Hypertension Father     Social History:  reports that he quit smoking about 41 years ago. His smoking use included cigarettes. He started smoking about 61 years ago. He has a 20 pack-year smoking history. He has never used smokeless tobacco. He reports that he does not drink alcohol and does not use drugs.  ROS: A complete review of systems was performed.  All systems are negative except for pertinent findings as noted.  Physical Exam:  Vital signs in last 24 hours: There were no vitals taken for this visit. Constitutional:  Alert and oriented, No acute distress Cardiovascular: Regular rate  Respiratory: Normal respiratory effort GU: Slightly lax anal sphincter tone.  Prostate 30 g,  symmetric, nonnodular, nontender. Neurologic: Grossly intact, no focal deficits Psychiatric: Normal mood and affect  I have reviewed prior pt notes  I have reviewed notes from referring/previous physicians-[facility] notes reviewed  I have independently reviewed prior imaging-- prostate ultrasound  I have reviewed prior PSA and pathology results  IPSS form reviewed   Impression/Assessment:  History of elevated PSA with negative biopsies x3.  PSA not being checked anymore.  Exam today benign  BPH-he is doing well with finasteride  Plan:  He will continue on finasteride  At his request, I will see him back yearly for recheck

## 2022-12-16 ENCOUNTER — Ambulatory Visit: Payer: Medicare HMO | Admitting: Urology

## 2022-12-16 ENCOUNTER — Encounter: Payer: Self-pay | Admitting: Urology

## 2022-12-16 VITALS — BP 116/55 | HR 53 | Ht 74.0 in | Wt 183.0 lb

## 2022-12-16 DIAGNOSIS — Z85528 Personal history of other malignant neoplasm of kidney: Secondary | ICD-10-CM | POA: Diagnosis not present

## 2022-12-16 DIAGNOSIS — R972 Elevated prostate specific antigen [PSA]: Secondary | ICD-10-CM

## 2022-12-16 DIAGNOSIS — N401 Enlarged prostate with lower urinary tract symptoms: Secondary | ICD-10-CM | POA: Diagnosis not present

## 2022-12-16 DIAGNOSIS — N138 Other obstructive and reflux uropathy: Secondary | ICD-10-CM

## 2022-12-16 DIAGNOSIS — Z87898 Personal history of other specified conditions: Secondary | ICD-10-CM

## 2022-12-16 DIAGNOSIS — N5201 Erectile dysfunction due to arterial insufficiency: Secondary | ICD-10-CM

## 2022-12-16 MED ORDER — FINASTERIDE 5 MG PO TABS: 5.0000 mg | ORAL_TABLET | Freq: Every evening | ORAL | 3 refills | Status: AC

## 2022-12-19 ENCOUNTER — Other Ambulatory Visit: Payer: Self-pay | Admitting: Family Medicine

## 2023-01-08 ENCOUNTER — Inpatient Hospital Stay: Payer: Medicare HMO | Admitting: Hematology and Oncology

## 2023-01-08 ENCOUNTER — Inpatient Hospital Stay: Payer: Medicare HMO | Attending: Hematology and Oncology

## 2023-01-08 ENCOUNTER — Telehealth: Payer: Self-pay

## 2023-01-08 ENCOUNTER — Encounter: Payer: Self-pay | Admitting: Hematology and Oncology

## 2023-01-08 VITALS — BP 143/66 | HR 68 | Temp 97.7°F | Resp 18 | Ht 74.0 in | Wt 185.2 lb

## 2023-01-08 DIAGNOSIS — D638 Anemia in other chronic diseases classified elsewhere: Secondary | ICD-10-CM

## 2023-01-08 DIAGNOSIS — Z85068 Personal history of other malignant neoplasm of small intestine: Secondary | ICD-10-CM | POA: Diagnosis not present

## 2023-01-08 DIAGNOSIS — Z905 Acquired absence of kidney: Secondary | ICD-10-CM | POA: Insufficient documentation

## 2023-01-08 DIAGNOSIS — Z85528 Personal history of other malignant neoplasm of kidney: Secondary | ICD-10-CM | POA: Diagnosis not present

## 2023-01-08 DIAGNOSIS — N183 Chronic kidney disease, stage 3 unspecified: Secondary | ICD-10-CM | POA: Diagnosis present

## 2023-01-08 DIAGNOSIS — C49A3 Gastrointestinal stromal tumor of small intestine: Secondary | ICD-10-CM

## 2023-01-08 DIAGNOSIS — Z79899 Other long term (current) drug therapy: Secondary | ICD-10-CM | POA: Insufficient documentation

## 2023-01-08 DIAGNOSIS — N1831 Chronic kidney disease, stage 3a: Secondary | ICD-10-CM

## 2023-01-08 DIAGNOSIS — C642 Malignant neoplasm of left kidney, except renal pelvis: Secondary | ICD-10-CM

## 2023-01-08 DIAGNOSIS — D539 Nutritional anemia, unspecified: Secondary | ICD-10-CM

## 2023-01-08 LAB — CBC WITH DIFFERENTIAL (CANCER CENTER ONLY)
Abs Immature Granulocytes: 0.02 10*3/uL (ref 0.00–0.07)
Basophils Absolute: 0.1 10*3/uL (ref 0.0–0.1)
Basophils Relative: 1 %
Eosinophils Absolute: 0.1 10*3/uL (ref 0.0–0.5)
Eosinophils Relative: 3 %
HCT: 30.5 % — ABNORMAL LOW (ref 39.0–52.0)
Hemoglobin: 10.4 g/dL — ABNORMAL LOW (ref 13.0–17.0)
Immature Granulocytes: 1 %
Lymphocytes Relative: 21 %
Lymphs Abs: 0.9 10*3/uL (ref 0.7–4.0)
MCH: 35.5 pg — ABNORMAL HIGH (ref 26.0–34.0)
MCHC: 34.1 g/dL (ref 30.0–36.0)
MCV: 104.1 fL — ABNORMAL HIGH (ref 80.0–100.0)
Monocytes Absolute: 0.6 10*3/uL (ref 0.1–1.0)
Monocytes Relative: 13 %
Neutro Abs: 2.6 10*3/uL (ref 1.7–7.7)
Neutrophils Relative %: 61 %
Platelet Count: 190 10*3/uL (ref 150–400)
RBC: 2.93 MIL/uL — ABNORMAL LOW (ref 4.22–5.81)
RDW: 12.8 % (ref 11.5–15.5)
WBC Count: 4.2 10*3/uL (ref 4.0–10.5)
nRBC: 0 % (ref 0.0–0.2)

## 2023-01-08 LAB — CMP (CANCER CENTER ONLY)
ALT: 6 U/L (ref 0–44)
AST: 15 U/L (ref 15–41)
Albumin: 3.9 g/dL (ref 3.5–5.0)
Alkaline Phosphatase: 55 U/L (ref 38–126)
Anion gap: 5 (ref 5–15)
BUN: 28 mg/dL — ABNORMAL HIGH (ref 8–23)
CO2: 25 mmol/L (ref 22–32)
Calcium: 9.3 mg/dL (ref 8.9–10.3)
Chloride: 111 mmol/L (ref 98–111)
Creatinine: 1.51 mg/dL — ABNORMAL HIGH (ref 0.61–1.24)
GFR, Estimated: 46 mL/min — ABNORMAL LOW (ref >60)
Glucose, Bld: 100 mg/dL — ABNORMAL HIGH (ref 70–99)
Potassium: 4.2 mmol/L (ref 3.5–5.1)
Sodium: 141 mmol/L (ref 135–145)
Total Bilirubin: 0.4 mg/dL (ref <1.2)
Total Protein: 6.7 g/dL (ref 6.5–8.1)

## 2023-01-08 LAB — FERRITIN: Ferritin: 223 ng/mL (ref 24–336)

## 2023-01-08 LAB — IRON AND IRON BINDING CAPACITY (CC-WL,HP ONLY)
Iron: 55 ug/dL (ref 45–182)
Saturation Ratios: 21 % (ref 17.9–39.5)
TIBC: 262 ug/dL (ref 250–450)
UIBC: 207 ug/dL (ref 117–376)

## 2023-01-08 NOTE — Telephone Encounter (Signed)
Patient made aware of lab results.  Per Dr. Bertis Ruddy - iron studies look good. Patient verbalized an understanding of the results and voiced appreciation for the call.

## 2023-01-08 NOTE — Assessment & Plan Note (Signed)
His last imaging study was January 2024 He is due for another appointment again in 2 months I reinforced the importance of his follow-up with urologist to monitor as he had a lesion in the right kidney that needs close monitoring

## 2023-01-08 NOTE — Assessment & Plan Note (Signed)
He has stable chronic kidney disease stage III since nephrectomy

## 2023-01-08 NOTE — Assessment & Plan Note (Signed)
He has not had EGD for 5 years I we will refer him to GI for repeat endoscopy

## 2023-01-08 NOTE — Assessment & Plan Note (Signed)
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 He has history of iron deficiency but repeat iron studies are adequate His anemia is stable. He does not require any further workup or treatment now. 

## 2023-01-08 NOTE — Progress Notes (Signed)
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 He has history of iron deficiency but repeat iron studies are adequate His anemia is stable. He does not require any further workup or treatment now.  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 The patient knows to call the clinic with any problems, questions or concerns. The total time spent in the appointment was 30 minutes encounter with patients including review of chart and various tests results, discussions about plan of care and coordination of care plan   Artis Delay, MD 01/08/2023 12:34 PM  INTERVAL HISTORY: Please see below for problem oriented charting. he returns for surveillance follow-up for history of GIST and kidney cancer He also have history of iron deficiency anemia He does not have appointment to see urologist He saw his other urologist for history of prostate cancer of which everything is stable He has not have upper endoscopy evaluation this year, his last EGD was in 2019 I noted he had macrocytosis; the patient revealed that he was prescribed azathioprine for unknown reason but that has been discontinued The MCV is improving  REVIEW OF SYSTEMS:   Constitutional: Denies fevers, chills or abnormal weight loss Eyes: Denies blurriness of vision Ears, nose, mouth, throat, and face: Denies mucositis or sore throat Respiratory: Denies cough, dyspnea or wheezes Cardiovascular: Denies palpitation, chest discomfort or lower extremity swelling Gastrointestinal:  Denies nausea, heartburn or change in bowel habits Skin: Denies abnormal skin rashes Lymphatics: Denies new lymphadenopathy or easy bruising Neurological:Denies numbness, tingling or new weaknesses Behavioral/Psych: Mood is stable, no new changes  All other systems were reviewed with the patient and are negative.  I have reviewed the past medical history, past surgical history, social history and family history with the patient and they are unchanged from previous note.  ALLERGIES:  has No Known Allergies.  MEDICATIONS:  Current Outpatient Medications  Medication Sig Dispense Refill   ARTIFICIAL TEAR OINTMENT OP Place 1 application into both eyes daily as needed (For dry eyes.).      aspirin EC 81 MG tablet Take 81 mg by mouth  daily.      b complex vitamins capsule Take 1 capsule by mouth daily.      dorzolamide (TRUSOPT) 2 % ophthalmic solution Place 1 drop into both eyes 2 (two) times daily.     dorzolamide-timolol (COSOPT) 22.3-6.8 MG/ML ophthalmic solution Place 1 drop  into both eyes 2 (two) times daily.      finasteride (PROSCAR) 5 MG tablet Take 1 tablet (5 mg total) by mouth every evening. 90 tablet 3   hydrocortisone (ANUSOL-HC) 25 MG suppository Place 25 mg rectally daily as needed for hemorrhoids or anal itching.      lanolin ointment Apply topically as needed (apply to eye).      lisinopril (PRINIVIL,ZESTRIL) 20 MG tablet Take 20 mg by mouth every morning.      Multiple Vitamins-Minerals (MULTIVITAMINS THER. W/MINERALS) TABS tablet Take 1 tablet by mouth daily.      nitroGLYCERIN (NITROSTAT) 0.4 MG SL tablet Place 1 tablet (0.4 mg total) under the tongue every 5 (five) minutes as needed for chest pain. 25 tablet 3   Omega-3 Fatty Acids (FISH OIL PO) Take 4 capsules by mouth daily.      omeprazole (PRILOSEC) 20 MG capsule Take 20 mg by mouth daily.      rosuvastatin (CRESTOR) 10 MG tablet Take 1 tablet (10 mg total) by mouth daily. Please schedule yearly appointment for future refills. Thank you 30 tablet 0   travoprost, benzalkonium, (TRAVATAN) 0.004 % ophthalmic solution Place 1 drop into both eyes at bedtime.      No current facility-administered medications for this visit.    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    06/29/2015 Imaging   MRI abdomen showed 1.9 cm enhancing lesion along the lateral left lower kidney, corresponding to the CT abnormality, suspicious for solid renal  neoplasm such as renal cell carcinoma   07/05/2015 Procedure   EUS confirmed 2.6 cm mass opposite opening of the ampulla   07/05/2015 Pathology Results   Accession: [id number] FNA was positive for GIST   10/17/2015 Surgery   He underwent combined stomach resection and kidney resection   10/17/2015 Pathology Results   Accession: [id number] pathology from stomach revealed 3.5 cm GIST with low mitotic rate but positive margin. He also has papillary cell carcinoma involving the kidney   10/16/2016 Imaging   1. Status post resection of a duodenum GIST. No findings for residual or recurrent tumor or metastatic disease. 2. Status post partial left nephrectomy for small renal cell carcinoma. No findings for recurrent tumor or metastatic disease. 3. Numerous bilateral renal cysts, some of which are hyperdense/hemorrhagic. No worrisome renal lesions. 4. Markedly enlarged prostate gland. 5. Small pericardial effusion and scattered mediastinal lymph nodes but no mass or overt adenopathy. 6. Benign-appearing hepatic cysts.   02/05/2017 Imaging   Ct scan of abdomen and pelvis showed no evidence of recurrent kidney cancer or GIST tumor. Probable small bowel enteritis   06/08/2017 Pathology Results   Duodenum,  Biopsy - PEPTIC DUODENITIS - NO DYSPLASIA OR MALIGNANCY IDENTIFIED   06/08/2017 Procedure   There was evidence of a patent previous surgical anastomosis in the first portion of the duodenum. This was characterized by healthy appearing mucosa. Biopsies were taken with a cold forceps for histology. Verification of patient identification for the specimen was done. Estimated blood loss was minimal. Findings: A non-obstructing Schatzki ring was found at the gastroesophageal junction. A small hiatal hernia was present. The cardia and gastric fundus were normal on retroflexion. The exam was otherwise without abnormality. - Patent previous surgical anastomosis, characterized by healthy appearing mucosa was  found in the duodenum. Biopsied. Was not photogtraphed - Non-obstructing Schatzki ring. - Small hiatal hernia. - The examination was otherwise   11/04/2018 Imaging   1. No evidence of recurrent tumor in the periampullary duodenum or in the partial nephrectomy bed in the lower left kidney on this noncontrast scan. 2. No findings of metastatic disease in the abdomen or pelvis on this noncontrast scan. 3. Simple and hyperdense renal cortical lesions in both kidneys, not substantially changed, previously characterized as Bosniak category 1 and 2 renal cysts on MRI. 4. Marked prostatomegaly. 5.  Aortic Atherosclerosis (ICD10-I70.0).     11/09/2019 Imaging   1. Postoperative changes of LEFT partial nephrectomy without signs of disease recurrence in this area. 2. There is a lesion in the lower-interpolar portion of the RIGHT kidney extending to the hilar line and just beyond that that measures approximately 1.8 x 1.9 cm previously approximately 1.4 x 1.1 cm. This could represent a small papillary renal neoplasm, MRI may be helpful for further assessment. 3. No signs of periampullary disease recurrence to the extent evaluated on CT. 4. Bilateral renal cysts some with proteinaceous and/or hemorrhagic features, otherwise not significantly changed. 5. Colonic diverticulosis. 6. Aortic atherosclerosis.   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 Postoperative changes of LEFT partial nephrectomy without signs of disease recurrence in this area. 2. There is a lesion in the lower-interpolar portion of the RIGHT kidney extending to the hilar line and just beyond that that measures approximately 1.8 x 1.9 cm previously approximately 1.4 x 1.1 cm. This could  represent a small papillary renal neoplasm, MRI may be helpful for further assessment. 3. No signs of periampullary disease recurrence to the extent evaluated on CT. 4. Bilateral renal cysts some with proteinaceous and/or hemorrhagic features, otherwise not significantly changed. 5. Colonic diverticulosis. 6. Aortic atherosclerosis.   11/23/2019 Imaging   1. Lesion in the interpolar RIGHT kidney biased towards the lower pole measures 2 x 1.7 cm. Areas of low level enhancement are present on delayed phase imaging. 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 Biopsy may be helpful to determine further management as clinically warranted. 2. Status post LEFT partial nephrectomy. 3. No evidence of metastatic disease in the abdomen. 4. Hepatic cysts.     PHYSICAL EXAMINATION: ECOG PERFORMANCE STATUS: 0 - Asymptomatic  Vitals:   01/08/23 1048  BP: (!) 143/66  Pulse: 68  Resp: 18  Temp: 97.7 F (36.5 C)  SpO2: 98%   Filed Weights   01/08/23 1048  Weight: 185 lb 3.2 oz (84 kg)    GENERAL:alert, no distress and comfortable  LABORATORY DATA:  I have reviewed the data as listed    Component Value Date/Time   NA 141 01/08/2023 1026   NA 139 05/03/2019 0854   NA 140 10/16/2016 0816   K 4.2 01/08/2023 1026   K 4.1 10/16/2016 0816   CL 111 01/08/2023 1026   CO2 25 01/08/2023 1026   CO2 24 10/16/2016 0816   GLUCOSE 100 (H) 01/08/2023 1026   GLUCOSE 96 10/16/2016 0816   BUN 28 (H) 01/08/2023 1026   BUN 21 05/03/2019 0854   BUN 17.4 10/16/2016 0816   CREATININE 1.51 (H) 01/08/2023 1026   CREATININE 1.8 (H) 10/16/2016 0816   CALCIUM 9.3 01/08/2023 1026   CALCIUM 9.6 10/16/2016 0816   PROT 6.7 01/08/2023 1026   PROT 6.7 05/03/2019 0854   PROT 7.2 10/16/2016 0816   ALBUMIN 3.9 01/08/2023 1026   ALBUMIN 4.2 05/03/2019 0854   ALBUMIN 3.5 10/16/2016 0816   AST 15 01/08/2023 1026   AST 20 10/16/2016 0816   ALT 6  01/08/2023 1026   ALT 16 10/16/2016 0816   ALKPHOS 55 01/08/2023 1026   ALKPHOS 69 10/16/2016 0816   BILITOT 0.4 01/08/2023 1026   BILITOT 0.44 10/16/2016 0816   GFRNONAA 46 (L) 01/08/2023 1026   GFRAA 49 (L) 11/09/2019 1009    No results found for: "SPEP", "UPEP"  Lab Results  Component Value Date   WBC 4.2 01/08/2023   NEUTROABS 2.6 01/08/2023   HGB 10.4 (L) 01/08/2023   HCT 30.5 (L) 01/08/2023   MCV 104.1 (H) 01/08/2023   PLT 190 01/08/2023      Chemistry      Component Value Date/Time   NA 141 01/08/2023 1026   NA 139 05/03/2019 0854   NA 140 10/16/2016 0816   K 4.2 01/08/2023 1026   K 4.1 10/16/2016 0816   CL 111 01/08/2023 1026   CO2 25 01/08/2023 1026   CO2 24 10/16/2016 0816   BUN 28 (H) 01/08/2023 1026   BUN 21 05/03/2019 0854   BUN 17.4 10/16/2016 0816   CREATININE 1.51 (H) 01/08/2023 1026   CREATININE 1.8 (H) 10/16/2016 0816      Component Value Date/Time   CALCIUM 9.3 01/08/2023 1026   CALCIUM 9.6 10/16/2016 0816   ALKPHOS 55 01/08/2023 1026   ALKPHOS 69 10/16/2016 0816   AST 15 01/08/2023 1026   AST 20 10/16/2016 0816   ALT 6 01/08/2023 1026   ALT 16 10/16/2016 0816   BILITOT 0.4 01/08/2023 1026   BILITOT 0.44 10/16/2016 0816

## 2023-03-14 ENCOUNTER — Encounter: Payer: Self-pay | Admitting: Cardiovascular Disease

## 2023-03-14 NOTE — Progress Notes (Signed)
 Cardiology Office Note:    Date:  03/16/2023   ID:  Mike Roach, DOB 04-03-1940, MRN [id number]  PCP:  Mike Johann LABOR, DO  Cardiologist:  Mike Roach  Electrophysiologist:  None   Referring MD: Mike Johann LABOR, DO   Chief Complaint  Patient presents with   Pericardial Effusion   Coronary Artery Disease         History of Present Illness:    Mike Roach is a 83 y.o. 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 Robinson .   The patient has CT scans on a regular basis for his gastrointestinal cancer as well as his renal cell cancer.  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  He also has been noted to have some atherosclerosis in his aorta.  Has some chest tightness  with walking  May last for a minute   Walks some at work.   Security work at [employer]  No real exercise per se.  Busy waking much of the day .   Cholesterol levels from the [facility] medical [facility] from March 15, 2018 reveal a total cholesterol of 169.  The triglyceride level is 126.  HDL is 46.  LDL is 98.  May 03, 2019 Seen today for follow up of pericardial effusion Echo in Nov. 2020 showed a trivial pericardial effusion  No additional complaints.  Has some back pains He had a coronary CT angiogram in December, 2020 that revealed mild RCA disease.  The left main has mild irregularities.  He has mild to moderate stenosis in the LAD system and mild disease in the left circumflex system. Coronary calcium  score was 503 which 79th percentile for age and gender matched controls.    Jan. 13, 2025 [name] is seen for follow up of his pericardial effusion He was last seen in the office in March , 2021 He thought he should check back in with  us . No symptoms  Exercises regularly ,  no CP , no dyspnea  Rides his stationary bike daily ,  is active . No limitations in his activities   Has mild - moderate coronary artery disease by Cor CTA in Dec. 2020      Past Medical History:  Diagnosis Date   Adenomatous colon polyp 09/21/2014   Anemia in chronic illness 03/07/2015   being evaluated   Arthritis    Atrial flutter (HCC)    Bilateral hearing loss 03/15/2015   Cancer of intestine (HCC)    Chronic kidney disease (CKD) 03/07/2015   Chronic kidney disease, stage III (moderate) (HCC) 03/07/2015   Chronic right shoulder pain 02/03/2018   GERD (gastroesophageal reflux disease)    GIST (gastrointestinal stroma tumor), malignant, colon (HCC) dx'd 2017   Duodenom,    Glaucoma    both eyes   Hearing impairment    Hypercholesterolemia    Hypertension    Iron deficiency anemia 03/15/2015   Kidney lesion, native, left 07/12/2015   Lateral meniscus derangement 01/12/2013   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   Osteoarthritis of right knee 01/12/2013   Prostate enlargement    Renal cancer, left (HCC) dx'd 2017   Skin lesion of left leg 03/07/2015    Past Surgical History:  Procedure Laterality Date  colonoscopy     ESOPHAGOGASTRODUODENOSCOPY (EGD) WITH PROPOFOL  N/A 06/08/2017   Procedure: ESOPHAGOGASTRODUODENOSCOPY (EGD) WITH PROPOFOL ;  Surgeon: Avram Lupita BRAVO, MD;  [facility];  Service: Endoscopy;  Laterality: N/A;   EUS N/A 07/05/2015   Procedure: UPPER ENDOSCOPIC ULTRASOUND (EUS) LINEAR;  Surgeon: Toribio SHAUNNA Cedar, MD;  [facility];  Service: Endoscopy;  Laterality: N/A;   KNEE ARTHROSCOPY WITH LATERAL MENISECTOMY Right 01/21/2013   Procedure: KNEE ARTHROSCOPY WITH PARTIAL LATERAL MENISECTOMY;  Surgeon: Taft BRAVO Minerva, MD;  [facility];  Service: Orthopedics;  Laterality: Right;   ORIF ANKLE DISLOCATION Right 15 yrs ago   ROBOTIC ASSITED PARTIAL NEPHRECTOMY Left 10/17/2015   Procedure: XI ROBOTIC  ASSITED LEFT PARTIAL NEPHRECTOMY, Left Cyst Decortication, Left Renal Ultrasound;  Surgeon: Ricardo Likens, MD;  Location: [facility];  Service: Urology;  Laterality: Left;    Current Medications: Current Meds  Medication Sig   ARTIFICIAL TEAR OINTMENT OP Place 1 application into both eyes daily as needed (For dry eyes.).    aspirin  EC 81 MG tablet Take 81 mg by mouth daily.    b complex vitamins capsule Take 1 capsule by mouth daily.    dorzolamide  (TRUSOPT ) 2 % ophthalmic solution Place 1 drop into both eyes 2 (two) times daily.   dorzolamide -timolol (COSOPT) 22.3-6.8 MG/ML ophthalmic solution Place 1 drop into both eyes 2 (two) times daily.    finasteride  (PROSCAR ) 5 MG tablet Take 1 tablet (5 mg total) by mouth every evening.   hydrocortisone (ANUSOL-HC) 25 MG suppository Place 25 mg rectally daily as needed for hemorrhoids or anal itching.    lanolin ointment Apply topically as needed (apply to eye).    lisinopril  (PRINIVIL ,ZESTRIL ) 20 MG tablet Take 20 mg by mouth every morning.    Multiple Vitamins-Minerals (MULTIVITAMINS THER. W/MINERALS) TABS tablet Take 1 tablet by mouth daily.    nitroGLYCERIN  (NITROSTAT ) 0.4 MG SL tablet Place 1 tablet (0.4 mg total) under the tongue every 5 (five) minutes as needed for chest pain.   Omega-3 Fatty Acids (FISH OIL PO) Take 4 capsules by mouth daily.    omeprazole (PRILOSEC) 20 MG capsule Take 20 mg by mouth daily.    rosuvastatin  (CRESTOR ) 10 MG tablet Take 1 tablet (10 mg total) by mouth daily. Please schedule yearly appointment for future refills. Thank you   travoprost , benzalkonium, (TRAVATAN ) 0.004 % ophthalmic solution Place 1 drop into both eyes at bedtime.      Allergies:   Patient has no known allergies.   Social History   Socioeconomic History   Marital status: Divorced    Spouse name: Not on file   Number of children: Not on file   Years of education: Not on file   Highest education level: Not on file  Occupational History    Occupation: Security guard  Tobacco Use   Smoking status: Former    Current packs/day: 0.00    Average packs/day: 1 pack/day for 20.0 years (20.0 ttl pk-yrs)    Types: Cigarettes    Start date: 01/19/1961    Quit date: 01/19/1981    Years since quitting: 42.1   Smokeless tobacco: Never  Vaping Use   Vaping status: Never Used  Substance and Sexual Activity   Alcohol  use: No   Drug use: No   Sexual activity: Yes    Birth control/protection: None    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  Other Topics Concern   Not on file  Social History Narrative  Divorced   Employed as a electrical engineer   Former smoker but no current tobacco no alcohol  or drug use   Social Drivers of Corporate Investment Banker Strain: Low Risk  (10/08/2022)   Received from [facility]   Overall Financial Resource Strain (CARDIA)    Difficulty of Paying Living Expenses: Not hard at all  Food Insecurity: No Food Insecurity (10/08/2022)   Received from [facility]   Hunger Vital Sign    Worried About Running Out of Food in the Last Year: Never true    Ran Out of Food in the Last Year: Never true  Transportation Needs: No Transportation Needs (10/08/2022)   Received from [facility] - Transportation    Lack of Transportation (Medical): No    Lack of Transportation (Non-Medical): No  Physical Activity: Sufficiently Active (10/08/2022)   Received from [facility]   Exercise Vital Sign    Days of Exercise per Week: 5 days    Minutes of Exercise per Session: 30 min  Stress: No Stress Concern Present (10/08/2022)   Received from [facility] - Occupational Stress Questionnaire    Feeling of Stress : Not at all  Social Connections: Moderately Integrated (10/08/2022)   Received from [facility]   Social Connection and Isolation Panel [NHANES]    Frequency of Communication with Friends and Family: Three times a week     Frequency of Social Gatherings with Friends and Family: Twice a week    Attends Religious Services: More than 4 times per year    Active Member of [employer] or Organizations: Yes    Attends Engineer, Structural: More than 4 times per year    Marital Status: Divorced     Family History: The patient's family history includes Hypertension in his father.  ROS:   Please see the history of present illness.     All other systems reviewed and are negative.  EKGs/Labs/Other Studies Reviewed:    The following studies were reviewed today:     Recent Labs: 01/08/2023: ALT 6; BUN 28; Creatinine 1.51; Hemoglobin 10.4; Platelet Count 190; Potassium 4.2; Sodium 141  Recent Lipid Panel    Component Value Date/Time   CHOL 142 05/03/2019 0854   TRIG 75 05/03/2019 0854   HDL 51 05/03/2019 0854   CHOLHDL 2.8 05/03/2019 0854   LDLCALC 76 05/03/2019 0854    Physical Exam:      Physical Exam: Blood pressure 128/74, pulse 73, height 6' 3 (1.905 m), weight 185 lb (83.9 kg), SpO2 98%.       GEN:  Well nourished, well developed in no acute distress HEENT: Normal NECK: No JVD; No carotid bruits LYMPHATICS: No lymphadenopathy CARDIAC: RRR , no murmurs, rubs, gallops RESPIRATORY:  Clear to auscultation without rales, wheezing or rhonchi  ABDOMEN: Soft, non-tender, non-distended MUSCULOSKELETAL:  No edema; No deformity  SKIN: Warm and dry NEUROLOGIC:  Alert and oriented x 3     EKG:    EKG Interpretation Date/Time:  Monday March 16 2023 09:46:27 EST Ventricular Rate:  64 PR Interval:  152 QRS Duration:  112 QT Interval:  430 QTC Calculation: 443 R Axis:   2  Text Interpretation: Normal sinus rhythm with sinus arrhythmia Minimal voltage criteria for LVH, may be normal variant ( R in aVL ) When compared with ECG of 09-Oct-2015 10:03, Vent. rate has increased BY  22 BPM Confirmed by Eneida Evers,  Zsofia Prout ([id number]) on 03/16/2023 10:05:50 AM    ASSESSMENT:    1. Pericardial effusion      PLAN:       1.  Hx of pericardial effusion.    No symptoms .  He will let us  know if he develops any dyspnea    2.  Mild coronary artery disease.  He has a coronary calcium  score of 503 which is 79th percentile for age and gender matched controls.  There were no significant stenoses.     LDL from March 2021 is 75.    Lipids managed by his primary MD   Follow up in 1 year with Dr. Cassondra Ross .     Medication Adjustments/Labs and Tests Ordered: Current medicines are reviewed at length with the patient today.  Concerns regarding medicines are outlined above.  Orders Placed This Encounter  Procedures   EKG 12-Lead   No orders of the defined types were placed in this encounter.    Patient Instructions  Medication Instructions:  Your physician recommends that you continue on your current medications as directed. Please refer to the Current Medication list given to you today.  *If you need a refill on your cardiac medications before your next appointment, please call your pharmacy*  Lab Work: None ordered today.  Testing/Procedures: None ordered today.  Follow-Up: At [facility], you and your health needs are our priority.  As part of our continuing mission to provide you with exceptional heart care, we have created designated Provider Care Teams.  These Care Teams include your primary Cardiologist (physician) and Advanced Practice Providers (APPs -  Physician Assistants and Nurse Practitioners) who all work together to provide you with the care you need, when you need it.  We recommend signing up for the patient portal called MyChart.  Sign up information is provided on this After Visit Summary.  MyChart is used to connect with patients for Virtual Visits (Telemedicine).  Patients are able to view lab/test results, encounter notes, upcoming appointments, etc.  Non-urgent messages can be sent to your provider as well.   To learn more about what you can do with  MyChart, go to [email or web address].    Your next appointment:   1 year(s)  The format for your next appointment:   In Person  Provider:   Dorn Ross, MD {   Signed, Aleene Passe, MD  03/16/2023 10:05 AM    [facility]

## 2023-03-16 ENCOUNTER — Ambulatory Visit: Payer: Medicare HMO | Attending: Cardiovascular Disease | Admitting: Cardiovascular Disease

## 2023-03-16 ENCOUNTER — Encounter: Payer: Self-pay | Admitting: Cardiovascular Disease

## 2023-03-16 VITALS — BP 128/74 | HR 73 | Ht 75.0 in | Wt 185.0 lb

## 2023-03-16 DIAGNOSIS — I3139 Other pericardial effusion (noninflammatory): Secondary | ICD-10-CM | POA: Diagnosis not present

## 2023-03-16 NOTE — Patient Instructions (Signed)
 Medication Instructions:  Your physician recommends that you continue on your current medications as directed. Please refer to the Current Medication list given to you today.  *If you need a refill on your cardiac medications before your next appointment, please call your pharmacy*  Lab Work: None ordered today.  Testing/Procedures: None ordered today.  Follow-Up: At [facility], you and your health needs are our priority.  As part of our continuing mission to provide you with exceptional heart care, we have created designated Provider Care Teams.  These Care Teams include your primary Cardiologist (physician) and Advanced Practice Providers (APPs -  Physician Assistants and Nurse Practitioners) who all work together to provide you with the care you need, when you need it.  We recommend signing up for the patient portal called MyChart.  Sign up information is provided on this After Visit Summary.  MyChart is used to connect with patients for Virtual Visits (Telemedicine).  Patients are able to view lab/test results, encounter notes, upcoming appointments, etc.  Non-urgent messages can be sent to your provider as well.   To learn more about what you can do with MyChart, go to [email or web address].    Your next appointment:   1 year(s)  The format for your next appointment:   In Person  Provider:   Dorn Ross, MD {

## 2023-04-08 DIAGNOSIS — M47815 Spondylosis without myelopathy or radiculopathy, thoracolumbar region: Secondary | ICD-10-CM | POA: Insufficient documentation

## 2023-04-08 NOTE — Progress Notes (Signed)
 knee Intake history:  BP (!) 186/111   Pulse 84   Ht 6' 2 (1.88 m)   Wt 185 lb (83.9 kg)   BMI 23.75 kg/m  Body mass index is 23.75 kg/m.    WHAT ARE WE SEEING YOU FOR TODAY?   right knee(s)  How long has this bothered you? (DOI?DOS?WS?)  1 year(s) ago Has xray CD from a year ago  Anticoag.  No  Diabetes yes  Heart disease No  Hypertension Yes  SMOKING HX No  Kidney disease Yes  Any ALLERGIES ______________________________________________   Treatment:  Have you taken:  Tylenol  No  Advil No  Had PT No  Had injection Yes  Other  _____________states had injections in [place] ____________

## 2023-04-10 ENCOUNTER — Ambulatory Visit (INDEPENDENT_AMBULATORY_CARE_PROVIDER_SITE_OTHER): Payer: Medicare HMO | Admitting: Orthopedic Surgery

## 2023-04-10 ENCOUNTER — Other Ambulatory Visit (INDEPENDENT_AMBULATORY_CARE_PROVIDER_SITE_OTHER): Payer: Self-pay

## 2023-04-10 VITALS — BP 186/111 | HR 84 | Ht 74.0 in | Wt 185.0 lb

## 2023-04-10 DIAGNOSIS — G8929 Other chronic pain: Secondary | ICD-10-CM

## 2023-04-10 DIAGNOSIS — M1711 Unilateral primary osteoarthritis, right knee: Secondary | ICD-10-CM | POA: Diagnosis not present

## 2023-04-10 NOTE — Progress Notes (Signed)
 Office Visit Note   Patient: Mike Roach           Date of Birth: 07/08/1940           MRN: [id number] Visit Date: 04/10/2023 Requested by: Verdie Johann LABOR, DO [address] [place],  [place] [address] PCP: Verdie Johann LABOR, DO   Assessment & Plan:   Encounter Diagnoses  Name Primary?   Chronic pain of right knee    Primary osteoarthritis of right knee Yes    No orders of the defined types were placed in this encounter.   83 year old male right knee pain chronic in nature osteoarthritis mild symptoms good function recommend physical therapy  Return 2 months recheck after therapy   Subjective: Chief Complaint  Patient presents with   Knee Pain    Right     83 year old male presents with right knee pain.  Previously seen at [facility] and treated with cortisone injections and hyaluronic acid.  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  He has some remote heart issue with fluid around the heart with pericardial effusion as well as chronic kidney disease and chronic anemia    Knee Pain                  ROS: No chest pain does have some occasional shortness of breath with walking for long periods of time   Images personally read and my interpretation : Images are in the media from [facility] clinic today show valgus osteoarthritis of the right knee they are from a year ago  Visit Diagnoses:  1. Primary osteoarthritis of right knee   2. Chronic pain of right knee      Follow-Up Instructions: Return in about 8 weeks (around 06/05/2023) for FOLLOW UP, RIGHT, KNEE.    Objective: Vital Signs: BP (!) 186/111   Pulse 84   Ht 6' 2 (1.88 m)   Wt 185 lb (83.9 kg)   BMI 23.75 kg/m   Physical Exam Vitals and nursing note reviewed.  Constitutional:      Appearance: Normal  appearance.  HENT:     Head: Normocephalic and atraumatic.  Eyes:     General: No scleral icterus.       Right eye: No discharge.        Left eye: No discharge.     Extraocular Movements: Extraocular movements intact.     Conjunctiva/sclera: Conjunctivae normal.     Pupils: Pupils are equal, round, and reactive to light.  Cardiovascular:     Rate and Rhythm: Normal rate.     Pulses: Normal pulses.  Musculoskeletal:     Right knee: Effusion present.  Skin:    General: Skin is warm and dry.     Capillary Refill: Capillary refill takes less than 2 seconds.  Neurological:     General: No focal deficit present.     Mental Status: He is alert and oriented to person, place, and time.     Gait: Gait normal.  Psychiatric:        Mood and Affect: Mood normal.        Behavior: Behavior normal.        Thought Content: Thought content normal.        Judgment: Judgment normal.  Right Knee Exam   Tenderness  The patient is experiencing no tenderness.   Range of Motion  Extension:  normal  Flexion:  120   Tests  Drawer:  Anterior - negative    Posterior - negative  Other  Erythema: absent Sensation: normal Pulse: present Swelling: none Effusion: effusion present       Specialty Comments:  No specialty comments available.  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 12/25/2020   Pseudophakia of both eyes 11/12/2020    Pericardial effusion 01/05/2019   Chest discomfort 11/05/2018   Chronic right shoulder pain 02/03/2018   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   Biceps tendon rupture, proximal, left, initial encounter 06/01/2017   Hyperlipidemia 03/12/2017   GIST (gastrointestinal stroma tumor), malignant, colon (HCC) 03/12/2017   Deficiency anemia 10/17/2016   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/17/2015 10/19/2015   Hypertension    GERD (gastroesophageal reflux disease)    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, 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 10/17/2015  07/12/2015   Kidney lesion, native, left 07/12/2015   Iron deficiency anemia 03/15/2015   Bilateral hearing loss 03/15/2015   Skin lesion of left leg 03/07/2015   Anemia in chronic illness 03/07/2015   Chronic kidney disease, stage III (moderate) (HCC) 03/07/2015   Chronic kidney disease (CKD) 03/07/2015   Adenomatous colon polyp 09/21/2014   Lateral meniscus derangement 01/12/2013   Osteoarthritis of right knee 01/12/2013   Past Medical History:  Diagnosis Date   Adenomatous colon polyp 09/21/2014   Anemia in chronic illness 03/07/2015   being evaluated   Arthritis    Atrial flutter (HCC)    Bilateral hearing loss 03/15/2015   Cancer of intestine (HCC)    Chronic kidney disease (CKD) 03/07/2015   Chronic kidney disease, stage III (moderate) (HCC) 03/07/2015   Chronic right shoulder pain 02/03/2018   GERD (gastroesophageal reflux disease)    GIST (gastrointestinal stroma tumor), malignant, colon (HCC) dx'd 2017   Duodenom,    Glaucoma    both eyes   Hearing impairment    Hypercholesterolemia    Hypertension    Iron deficiency anemia 03/15/2015   Kidney lesion, native, left 07/12/2015   Lateral meniscus derangement 01/12/2013   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   Osteoarthritis of right knee 01/12/2013   Prostate enlargement    Renal cancer, left (HCC) dx'd 2017    Skin lesion of left leg 03/07/2015    Family History  Problem Relation Age of Onset   Hypertension Father     Past Surgical History:  Procedure Laterality Date   colonoscopy     ESOPHAGOGASTRODUODENOSCOPY (EGD) WITH PROPOFOL  N/A 06/08/2017   Procedure: ESOPHAGOGASTRODUODENOSCOPY (EGD) WITH PROPOFOL ;  Surgeon: Avram Lupita BRAVO, MD;  [facility];  Service: Endoscopy;  Laterality: N/A;   EUS N/A 07/05/2015   Procedure: UPPER ENDOSCOPIC ULTRASOUND (EUS) LINEAR;  Surgeon: Toribio SHAUNNA Cedar, MD;  [facility];  Service: Endoscopy;  Laterality: N/A;  KNEE ARTHROSCOPY WITH LATERAL MENISECTOMY Right 01/21/2013   Procedure: KNEE ARTHROSCOPY WITH PARTIAL LATERAL MENISECTOMY;  Surgeon: Taft FORBES Minerva, MD;  Location: [facility];  Service: Orthopedics;  Laterality: Right;   ORIF ANKLE DISLOCATION Right 15 yrs ago   ROBOTIC ASSITED PARTIAL NEPHRECTOMY Left 10/17/2015   Procedure: XI ROBOTIC ASSITED LEFT PARTIAL NEPHRECTOMY, Left Cyst Decortication, Left Renal Ultrasound;  Surgeon: Ricardo Likens, MD;  [facility];  Service: Urology;  Laterality: Left;   Social History   Occupational History   Occupation: Security guard  Tobacco Use   Smoking status: Former    Current packs/day: 0.00    Average packs/day: 1 pack/day for 20.0 years (20.0 ttl pk-yrs)    Types: Cigarettes    Start date: 01/19/1961    Quit date: 01/19/1981    Years since quitting: 42.2   Smokeless tobacco: Never  Vaping Use   Vaping status: Never Used  Substance and Sexual Activity   Alcohol  use: No   Drug use: No   Sexual activity: Yes    Birth control/protection: None    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

## 2023-04-14 ENCOUNTER — Encounter: Payer: Self-pay | Admitting: Internal Medicine

## 2023-04-14 ENCOUNTER — Ambulatory Visit: Payer: Medicare HMO | Admitting: Internal Medicine

## 2023-04-14 VITALS — BP 120/68 | HR 56 | Ht 74.0 in | Wt 188.8 lb

## 2023-04-14 DIAGNOSIS — C49A3 Gastrointestinal stromal tumor of small intestine: Secondary | ICD-10-CM | POA: Diagnosis not present

## 2023-04-14 NOTE — Patient Instructions (Signed)
You have been scheduled for an endoscopy. Please follow written instructions given to you at your visit today.  If you use inhalers (even only as needed), please bring them with you on the day of your procedure.  If you take any of the following medications, they will need to be adjusted prior to your procedure:   DO NOT TAKE 7 DAYS PRIOR TO TEST- Trulicity (dulaglutide) Ozempic, Wegovy (semaglutide) Mounjaro (tirzepatide) Bydureon Bcise (exanatide extended release)  DO NOT TAKE 1 DAY PRIOR TO YOUR TEST Rybelsus (semaglutide) Adlyxin (lixisenatide) Victoza (liraglutide) Byetta (exanatide) ___________________________________________________________________________   I appreciate the opportunity to care for you. Stan Head, MD, [facility]

## 2023-04-14 NOTE — Progress Notes (Signed)
Mike Roach 83 y.o. [birth year] [id number]  Assessment & Plan:   Encounter Diagnosis  Name Primary?   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  Dr. Emeline Darling such as requested a surveillance EGD.  Last EGD 2019 negative.  EGD has been scheduled.    The risks and benefits as well as alternatives of endoscopic procedure(s) have been discussed and reviewed. All questions answered. The patient agrees to proceed.   Subjective:   Chief Complaint: History of GIST  HPI 83 year old man with a history of a duodenal GIST found that EGD in 2017, status post resection by Dr. Michaell Cowing that year.  Surveillance EGD 2019 negative for recurrence.  He was seeing Dr. Emeline Darling such in clinic late last year and she referred him for repeat EGD for surveillance.  Patient has a history of renal cell carcinoma and is followed by Dr. Urban Gibson.  An MRI of the abdomen in January 2024 showed innumerable Bosniak 1 and 2 separate renal lesions and a right kidney lesion that was increasing in size but no evidence of GIST recurrence.  He saw Dr. Emeline Darling stitch in November 2024 and she referred him for a follow-up EGD.  I do not believe he is ever had chemotherapy.  He denies any problems with abdominal pain bowel habit changes rectal bleeding.  Omeprazole controls heartburn and reflux symptoms.   There is also a history of iron deficiency anemia that has resolved.  Dr. Bertis Ruddy is currently thinks he has anemia of chronic disease.  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  He had a macrocytosis that Dr. Emeline Darling such attributed to azathioprine and that had been discontinued.     No Known Allergies Current Meds  Medication Sig   ARTIFICIAL TEAR OINTMENT OP Place 1 application into both eyes daily as needed (For dry eyes.).    aspirin EC 81 MG tablet Take 81 mg by  mouth daily.    b complex vitamins capsule Take 1 capsule by mouth daily.    dorzolamide-timolol (COSOPT) 22.3-6.8 MG/ML ophthalmic solution Place 1 drop into both eyes 2 (two) times daily.    finasteride (PROSCAR) 5 MG tablet Take 1 tablet (5 mg total) by mouth every evening.   hydrocortisone (ANUSOL-HC) 25 MG suppository Place 25 mg rectally daily as needed for hemorrhoids or anal itching.    lanolin ointment Apply topically as needed (apply to eye).    lisinopril (PRINIVIL,ZESTRIL) 20 MG tablet Take 20 mg by mouth every morning.    Multiple Vitamins-Minerals (MULTIVITAMINS THER. W/MINERALS) TABS tablet Take 1 tablet by mouth daily.    nitroGLYCERIN (NITROSTAT) 0.4 MG SL tablet Place 1 tablet (0.4 mg total) under the tongue every 5 (five) minutes as needed for chest pain.   Omega-3 Fatty Acids (FISH OIL PO) Take 4 capsules by mouth daily.    omeprazole (PRILOSEC) 20 MG capsule Take 20 mg by mouth daily.    rosuvastatin (CRESTOR) 10 MG tablet Take 1 tablet (10 mg total) by mouth daily. Please schedule yearly appointment for future refills. Thank you   travoprost, benzalkonium, (TRAVATAN) 0.004 % ophthalmic solution Place 1 drop into both eyes at bedtime.    Past Medical History:  Diagnosis Date   Adenomatous colon polyp 09/21/2014   Anemia in chronic illness 03/07/2015   being evaluated  Arthritis    Atrial flutter (HCC)    Bilateral hearing loss 03/15/2015   Cancer of intestine (HCC)    Chronic kidney disease (CKD) 03/07/2015   Chronic kidney disease, stage III (moderate) (HCC) 03/07/2015   Chronic right shoulder pain 02/03/2018   GERD (gastroesophageal reflux disease)    GIST (gastrointestinal stroma tumor), malignant, colon (HCC) dx'd 2017   Duodenom,    Glaucoma    both eyes   Hearing impairment    Hypercholesterolemia    Hypertension    Iron deficiency anemia 03/15/2015   Kidney lesion, native, left 07/12/2015   Lateral meniscus derangement 01/12/2013   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   Osteoarthritis of right knee 01/12/2013   Prostate enlargement    Renal cancer, left (HCC) dx'd 2017   Skin lesion of left leg 03/07/2015   Past Surgical History:  Procedure Laterality Date   colonoscopy     ESOPHAGOGASTRODUODENOSCOPY (EGD) WITH PROPOFOL N/A 06/08/2017   Procedure: ESOPHAGOGASTRODUODENOSCOPY (EGD) WITH PROPOFOL;  Surgeon: Iva Boop, MD;  Location: [facility];  Service: Endoscopy;  Laterality: N/A;   EUS N/A 07/05/2015   Procedure: UPPER ENDOSCOPIC ULTRASOUND (EUS) LINEAR;  Surgeon: Rachael Fee, MD;  Location: [facility];  Service: Endoscopy;  Laterality: N/A;   KNEE ARTHROSCOPY WITH LATERAL MENISECTOMY Right 01/21/2013   Procedure: KNEE ARTHROSCOPY WITH PARTIAL LATERAL MENISECTOMY;  Surgeon: Vickki Hearing, MD;  Location: [facility];  Service: Orthopedics;  Laterality: Right;   ORIF ANKLE DISLOCATION Right 15 yrs ago   ROBOTIC ASSITED PARTIAL NEPHRECTOMY Left 10/17/2015   Procedure: XI ROBOTIC ASSITED LEFT PARTIAL NEPHRECTOMY, Left Cyst Decortication, Left Renal Ultrasound;  Surgeon: Sebastian Ache, MD;  Location: [facility];  Service: Urology;  Laterality: Left;   Social History   Social History Narrative   Divorced   Employed as a Electrical engineer   Former smoker but no current tobacco no alcohol or drug use   family history includes Hypertension in his father.   Review of Systems Reports some changes in his vision otherwise negative  Objective:   Physical Exam @BP  120/68   Pulse (!) 56   Ht 6\' 2"  (1.88 m)   Wt 188 lb 12.8 oz (85.6 kg)   BMI 24.24 kg/m @  General:  NAD Eyes:   anicteric Lungs:  clear Heart::  S1S2 no rubs, murmurs or gallops Abdomen:  soft and nontender, BS+ Ext:   no edema, cyanosis or clubbing    Data Reviewed:  See HPI

## 2023-04-23 ENCOUNTER — Encounter: Payer: Medicare HMO | Admitting: Internal Medicine

## 2023-05-06 ENCOUNTER — Ambulatory Visit: Payer: Medicare HMO | Admitting: Internal Medicine

## 2023-05-06 ENCOUNTER — Encounter: Payer: Self-pay | Admitting: Internal Medicine

## 2023-05-06 VITALS — BP 132/77 | HR 60 | Temp 97.3°F | Resp 12 | Ht 74.0 in | Wt 188.0 lb

## 2023-05-06 DIAGNOSIS — K449 Diaphragmatic hernia without obstruction or gangrene: Secondary | ICD-10-CM | POA: Diagnosis not present

## 2023-05-06 DIAGNOSIS — K3189 Other diseases of stomach and duodenum: Secondary | ICD-10-CM

## 2023-05-06 DIAGNOSIS — C49A3 Gastrointestinal stromal tumor of small intestine: Secondary | ICD-10-CM

## 2023-05-06 DIAGNOSIS — K222 Esophageal obstruction: Secondary | ICD-10-CM | POA: Diagnosis not present

## 2023-05-06 MED ORDER — SODIUM CHLORIDE 0.9 % IV SOLN: 500.0000 mL | Freq: Once | INTRAVENOUS | Status: DC

## 2023-05-06 NOTE — Patient Instructions (Addendum)
 Handout provided about hiatal hernia.  Resume previous diet.  Continue present medications.  Await pathology results.  Will CC Dr. Bertis Ruddy.YOU HAD AN ENDOSCOPIC PROCEDURE TODAY AT THE [facility]:   Refer to the procedure report that was given to you for any specific questions about what was found during the examination.  If the procedure report does not answer your questions, please call your gastroenterologist to clarify.  If you requested that your care partner not be given the details of your procedure findings, then the procedure report has been included in a sealed envelope for you to review at your convenience later.  YOU SHOULD EXPECT: Some feelings of bloating in the abdomen. Passage of more gas than usual.  Walking can help get rid of the air that was put into your GI tract during the procedure and reduce the bloating. If you had a lower endoscopy (such as a colonoscopy or flexible sigmoidoscopy) you may notice spotting of blood in your stool or on the toilet paper. If you underwent a bowel prep for your procedure, you may not have a normal bowel movement for a few days.  Please Note:  You might notice some irritation and congestion in your nose or some drainage.  This is from the oxygen used during your procedure.  There is no need for concern and it should clear up in a day or so.  SYMPTOMS TO REPORT IMMEDIATELY:  Following upper endoscopy (EGD)  Vomiting of blood or coffee ground material  New chest pain or pain under the shoulder blades  Painful or persistently difficult swallowing  New shortness of breath  Fever of 100F or higher  Black, tarry-looking stools  For urgent or emergent issues, a gastroenterologist can be reached at any hour by calling [phone]. Do not use MyChart messaging for urgent concerns.    DIET:  We do recommend a small meal at first, but then you may proceed to your regular diet.  Drink plenty of fluids but you should avoid alcoholic  beverages for 24 hours.  ACTIVITY:  You should plan to take it easy for the rest of today and you should NOT DRIVE or use heavy machinery until tomorrow (because of the sedation medicines used during the test).    FOLLOW UP: Our staff will call the number listed on your records the next business day following your procedure.  We will call around 7:15- 8:00 am to check on you and address any questions or concerns that you may have regarding the information given to you following your procedure. If we do not reach you, we will leave a message.     If any biopsies were taken you will be contacted by phone or by letter within the next 1-3 weeks.  Please call us at [phone] if you have not heard about the biopsies in 3 weeks.    SIGNATURES/CONFIDENTIALITY: You and/or your care partner have signed paperwork which will be entered into your electronic medical record.  These signatures attest to the fact that that the information above on your After Visit Summary has been reviewed and is understood.  Full responsibility of the confidentiality of this discharge information lies with you and/or your care-partner.   YOU HAD AN ENDOSCOPIC PROCEDURE TODAY AT THE [facility]:   Refer to the procedure report that was given to you for any specific questions about what was found during the examination.  If the procedure report does not answer your questions, please call your  gastroenterologist to clarify.  If you requested that your care partner not be given the details of your procedure findings, then the procedure report has been included in a sealed envelope for you to review at your convenience later.  YOU SHOULD EXPECT: Some feelings of bloating in the abdomen. Passage of more gas than usual.  Walking can help get rid of the air that was put into your GI tract during the procedure and reduce the bloating. If you had a lower endoscopy (such as a colonoscopy or flexible sigmoidoscopy) you may  notice spotting of blood in your stool or on the toilet paper. If you underwent a bowel prep for your procedure, you may not have a normal bowel movement for a few days.  Please Note:  You might notice some irritation and congestion in your nose or some drainage.  This is from the oxygen used during your procedure.  There is no need for concern and it should clear up in a day or so.  SYMPTOMS TO REPORT IMMEDIATELY:  Following lower endoscopy (colonoscopy or flexible sigmoidoscopy):  Excessive amounts of blood in the stool  Significant tenderness or worsening of abdominal pains  Swelling of the abdomen that is new, acute  Fever of 100F or higher   For urgent or emergent issues, a gastroenterologist can be reached at any hour by calling [phone]. Do not use MyChart messaging for urgent concerns.    DIET:  We do recommend a small meal at first, but then you may proceed to your regular diet.  Drink plenty of fluids but you should avoid alcoholic beverages for 24 hours.  ACTIVITY:  You should plan to take it easy for the rest of today and you should NOT DRIVE or use heavy machinery until tomorrow (because of the sedation medicines used during the test).    FOLLOW UP: Our staff will call the number listed on your records the next business day following your procedure.  We will call around 7:15- 8:00 am to check on you and address any questions or concerns that you may have regarding the information given to you following your procedure. If we do not reach you, we will leave a message.     If any biopsies were taken you will be contacted by phone or by letter within the next 1-3 weeks.  Please call us at [phone] if you have not heard about the biopsies in 3 weeks.    SIGNATURES/CONFIDENTIALITY: You and/or your care partner have signed paperwork which will be entered into your electronic medical record.  These signatures attest to the fact that that the information above on your  After Visit Summary has been reviewed and is understood.  Full responsibility of the confidentiality of this discharge information lies with you and/or your care-partner.I did not see any signs of the tumor but took biopsies to check and be sure.  I will let you know.  I appreciate the opportunity to care for you. Iva Boop, MD, [facility]

## 2023-05-06 NOTE — Op Note (Signed)
 [facility] Patient Name: Mike Roach Procedure Date: 05/06/2023 1:02 PM MRN: [id number] Endoscopist: Iva Boop , MD, [id number] Age: 83 Referring MD:  Date of Birth: December 10, 1940 Gender: Male Account #: [id number] Procedure:                Upper GI endoscopy Indications:              Follow-up of malignant gastrointestinal stromal                            tumor of the duodenum resected in 2017 Medicines:                Monitored Anesthesia Care Procedure:                Pre-Anesthesia Assessment:                           - Prior to the procedure, a History and Physical                            was performed, and patient medications and                            allergies were reviewed. The patient's tolerance of                            previous anesthesia was also reviewed. The risks                            and benefits of the procedure and the sedation                            options and risks were discussed with the patient.                            All questions were answered, and informed consent                            was obtained. Prior Anticoagulants: The patient has                            taken no anticoagulant or antiplatelet agents. ASA                            Grade Assessment: III - A patient with severe                            systemic disease. After reviewing the risks and                            benefits, the patient was deemed in satisfactory                            condition to undergo the procedure.  After obtaining informed consent, the endoscope was                            passed under direct vision. Throughout the                            procedure, the patient's blood pressure, pulse, and                            oxygen saturations were monitored continuously. The                            Olympus Scope [id number] was introduced through the                            mouth, and  advanced to the second part of duodenum.                            The upper GI endoscopy was accomplished without                            difficulty. The patient tolerated the procedure                            well. Scope In: Scope Out: Findings:                 Localized nodular mucosa was found in the second                            portion of the duodenum. Biopsies were taken with a                            cold forceps for histology. Verification of patient                            identification for the specimen was done. Estimated                            blood loss was minimal.                           A 5 cm hiatal hernia was present.                           A non-obstructing Schatzki ring was found at the                            gastroesophageal junction.                           The exam was otherwise without abnormality.                           The cardia and gastric fundus were normal on  retroflexion. Complications:            No immediate complications. Estimated Blood Loss:     Estimated blood loss was minimal. Impression:               - Nodular mucosa in the second portion of the                            duodenum. Biopsied. Several nodules in area of                            resection of GIST - I suspect these are post-op                            changes but took biopsies out of an abundance of                            caution.                           - 5 cm hiatal hernia.                           - Non-obstructing Schatzki ring.                           - The examination was otherwise normal. Recommendation:           - Patient has a contact number available for                            emergencies. The signs and symptoms of potential                            delayed complications were discussed with the                            patient. Return to normal activities tomorrow.                             Written discharge instructions were provided to the                            patient.                           - Resume previous diet.                           - Continue present medications.                           - Await pathology results. will CC Dr. Fredderick Phenix, MD 05/06/2023 1:39:51 PM This report has been signed electronically.

## 2023-05-06 NOTE — Progress Notes (Signed)
 History and Physical Interval Note:  05/06/2023 1:06 PM  Mike Roach  has presented today for endoscopic procedure(s), with the diagnosis of  Encounter Diagnosis  Name Primary?   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  The various methods of evaluation and treatment have been discussed with the patient and/or family. After consideration of risks, benefits and other options for treatment, the patient has consented to  the endoscopic procedure(s).   The patient's history has been reviewed, patient examined, no change in status, stable for endoscopic procedure(s).  I have reviewed the patient's chart and labs.  Questions were answered to the patient's satisfaction.     Iva Boop, MD, [facility]

## 2023-05-06 NOTE — Progress Notes (Signed)
 Called to room to assist during endoscopic procedure.  Patient ID and intended procedure confirmed with present staff. Received instructions for my participation in the procedure from the performing physician.

## 2023-05-06 NOTE — Progress Notes (Signed)
 Pt sedate, gd SR's, VSS, report to RN

## 2023-05-07 ENCOUNTER — Telehealth: Payer: Self-pay

## 2023-05-07 NOTE — Telephone Encounter (Signed)
 Left message on follow up call.

## 2023-05-11 ENCOUNTER — Encounter: Payer: Self-pay | Admitting: Internal Medicine

## 2023-05-11 LAB — SURGICAL PATHOLOGY

## 2023-05-18 ENCOUNTER — Other Ambulatory Visit: Payer: Self-pay

## 2023-05-18 ENCOUNTER — Ambulatory Visit (HOSPITAL_COMMUNITY): Payer: Medicare HMO | Attending: Orthopedic Surgery

## 2023-05-18 ENCOUNTER — Encounter (HOSPITAL_COMMUNITY): Payer: Self-pay

## 2023-05-18 DIAGNOSIS — G8929 Other chronic pain: Secondary | ICD-10-CM | POA: Diagnosis present

## 2023-05-18 DIAGNOSIS — M25561 Pain in right knee: Secondary | ICD-10-CM | POA: Diagnosis present

## 2023-05-18 NOTE — Therapy (Signed)
 OUTPATIENT PHYSICAL THERAPY LOWER EXTREMITY EVALUATION   Patient Name: Mike Roach MRN: [id number] DOB:1941/02/12, 83 y.o., male Today's Date: 05/18/2023  END OF SESSION:  PT End of Session - 05/18/23 0840     Visit Number 1    Number of Visits 8    Date for PT Re-Evaluation 06/15/23    Authorization Type [employer]    Authorization Time Period No auth required    PT Start Time 0845    PT Stop Time 0931    PT Time Calculation (min) 46 min    Activity Tolerance Patient tolerated treatment well    Behavior During Therapy [facility] for tasks assessed/performed             Past Medical History:  Diagnosis Date   Adenomatous colon polyp 09/21/2014   Anemia in chronic illness 03/07/2015   being evaluated   Arthritis    Atrial flutter (HCC)    Bilateral hearing loss 03/15/2015   Cancer of intestine (HCC)    Cataract    Chronic kidney disease (CKD) 03/07/2015   Chronic kidney disease, stage III (moderate) (HCC) 03/07/2015   Chronic right shoulder pain 02/03/2018   GERD (gastroesophageal reflux disease)    GIST (gastrointestinal stroma tumor), malignant, colon (HCC) dx'd 2017   Duodenom,    Glaucoma    both eyes   Hearing impairment    Hypercholesterolemia    Hypertension    Iron deficiency anemia 03/15/2015   Kidney lesion, native, left 07/12/2015   Lateral meniscus derangement 01/12/2013   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   Osteoarthritis of right knee 01/12/2013   Prostate enlargement    Renal cancer, left (HCC) dx'd 2017   Skin lesion of left leg 03/07/2015   Past Surgical History:  Procedure Laterality Date   colonoscopy     COLONOSCOPY     ESOPHAGOGASTRODUODENOSCOPY (EGD) WITH PROPOFOL N/A 06/08/2017   Procedure: ESOPHAGOGASTRODUODENOSCOPY (EGD) WITH PROPOFOL;  Surgeon: Iva Boop, MD;  Location: [facility];  Service: Endoscopy;  Laterality: N/A;   EUS N/A 07/05/2015   Procedure: UPPER ENDOSCOPIC ULTRASOUND (EUS) LINEAR;  Surgeon: Rachael Fee, MD;  Location: [facility];  Service: Endoscopy;  Laterality: N/A;   KNEE ARTHROSCOPY WITH LATERAL MENISECTOMY Right 01/21/2013   Procedure: KNEE ARTHROSCOPY WITH PARTIAL LATERAL MENISECTOMY;  Surgeon: Vickki Hearing, MD;  [facility]: [facility];  Service: Orthopedics;  Laterality: Right;   ORIF ANKLE DISLOCATION Right 15 yrs ago   ROBOTIC ASSITED PARTIAL NEPHRECTOMY Left 10/17/2015   Procedure: XI ROBOTIC ASSITED LEFT PARTIAL NEPHRECTOMY, Left Cyst Decortication, Left Renal Ultrasound;  Surgeon: Sebastian Ache, MD;  Location: [facility];  Service: Urology;  Laterality: Left;   UPPER GASTROINTESTINAL ENDOSCOPY     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 12/25/2020   Pseudophakia of both eyes 11/12/2020   Pericardial effusion 01/05/2019   Chest discomfort 11/05/2018   Chronic right shoulder pain 02/03/2018   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   Biceps tendon rupture, proximal, left, initial encounter 06/01/2017   Hyperlipidemia 03/12/2017   GIST (gastrointestinal stroma tumor), malignant, colon (HCC) 03/12/2017   Deficiency anemia 10/17/2016   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/17/2015 10/19/2015   Hypertension    GERD (gastroesophageal reflux disease)    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, 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 10/17/2015  07/12/2015   Kidney lesion, native, left 07/12/2015   Iron deficiency anemia 03/15/2015   Bilateral hearing loss 03/15/2015   Skin lesion of left leg 03/07/2015   Anemia in chronic illness 03/07/2015   Chronic kidney disease, stage III (moderate) (HCC) 03/07/2015   Chronic kidney disease (CKD) 03/07/2015   Adenomatous colon polyp  09/21/2014   Lateral meniscus derangement 01/12/2013   Osteoarthritis of right knee 01/12/2013    PCP: Lindaann Slough, DO  REFERRING PROVIDER: Vickki Hearing, MD  REFERRING DIAG: [phone] (ICD-10-CM) - Chronic pain of right knee   THERAPY DIAG:  Chronic pain of right knee - Plan: PT plan of care cert/re-cert  Rationale for Evaluation and Treatment: Rehabilitation  ONSET DATE: 1 year  SUBJECTIVE:   SUBJECTIVE STATEMENT: Patient arrives to Physical therapy for R knee pain. He reports he can tolerate standing the amount of time it takes to cut 3 heads, ~1.5 hours, but it causes his R knee to swell. After standing that long, it hurts to even pick the R leg up to put it in the car. At one point, he started using the bike for exercise and it felt like it was making it worse so he stopped. Reports walking after prolonged standing hurts as well. Reports when the swelling does occur, it lasts a few hours. Rest helps. Doesn't wake him up at night anymore. Describes it as discomfort, not really pain.  PERTINENT HISTORY: Primary OA in R knee Cancer PAIN:  Are you having pain? No  PRECAUTIONS: None  RED FLAGS: None   WEIGHT BEARING RESTRICTIONS: No  FALLS:  Has patient fallen in last 6 months? 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 Been a couple of years.   NEXT MD VISIT: 06/05/23  OBJECTIVE:  Note: Objective measures were completed at Evaluation unless otherwise noted.  DIAGNOSTIC FINDINGS:   Right knee pain osteoarthritis   X-ray shows mild varus alignment lateral compartment gonarthrosis narrowing of the compartment some sclerosis there is an effusion   Impression OA right knee mild valgus with effusion  PATIENT SURVEYS:  LEFS 62/80=77.5%  COGNITION: Overall cognitive status: Within functional limits for tasks  assessed     SENSATION: WFL  EDEMA:  None to note on this date but pt reports inc swelling following prolonged standing/sitting  MUSCLE LENGTH: Hamstrings: Right:     , Left: Thomas test: Right:     , Left:  POSTURE: rounded shoulders and increased thoracic kyphosis  PALPATION: N/a. Crepitus felt with Knee ext MMT on R. Pt reports mild pain  LOWER EXTREMITY ROM:  Active ROM Right eval Left eval  Hip flexion    Hip extension    Hip abduction    Hip adduction    Hip internal rotation    Hip external rotation    Knee flexion 109   Knee extension -8   Ankle dorsiflexion [facility]  Ankle plantarflexion    Ankle inversion    Ankle eversion     (Blank rows = not tested)  LOWER EXTREMITY MMT:  MMT Right eval Left eval  Hip flexion 4-/5 5/5  Hip extension    Hip abduction    Hip adduction    Hip internal rotation    Hip external rotation    Knee flexion  4-/5 5/5  Knee extension 4-/5 5/5  Ankle dorsiflexion 5/5 5/5  Ankle plantarflexion    Ankle inversion    Ankle eversion     (Blank rows = not tested)   FUNCTIONAL TESTS:   30 seconds chair stand test: 7.5, no UE use 2 Minute Walk Test: 420', no AD  GAIT: Distance walked: 40' + to/from exam room Assistive device utilized: None Level of assistance: Complete Independence Comments: Pt ambulates with good speed, no AD, kyphotic posture, slightly ER feet bilaterally, slightly flexed knees throughout                                                                                                                               TREATMENT DATE: 05/18/2023     PATIENT EDUCATION:  Education details: PT evaluation, objective findings, POC, Importance of HEP, Precautions, Clinic policies  Person educated: Patient Education method: Explanation, Tactile cues, and Verbal cues Education comprehension: verbalized understanding and returned demonstration  HOME EXERCISE PROGRAM: Access Code: [email or web address] URL:  [email or web address] Date: 05/18/2023 Prepared by: Fabiola Backer Powell-Butler  Exercises - Supine Short Arc Quad  - 2 x daily - 7 x weekly - 3 sets - 10 reps - 5 hold - Supine Heel Slide with Strap  - 2 x daily - 7 x weekly - 2 sets - 10 reps - 5 hold - Sit to Stand  - 2 x daily - 7 x weekly - 2 sets - 10 reps  ASSESSMENT:  CLINICAL IMPRESSION: Patient is a 83 y.o. male who was seen today for physical therapy evaluation and treatment for M25.561,G89.29 (ICD-10-CM) - Chronic pain of right knee. Patient tolerated session very well. Patient is not experiencing any pain, discomfort, or swelling at start or during evaluation but reports his usual pain is primarily due to prolonged standing. Patient demonstrates decreased ROM and strength in R knee musculature as well as decreased LE power and/or endurance via the 30 sec STS test which he would benefit from skilled physical therapy to address in order to improve his function and QOL. HEP administered with pt demo good carryover during performance of all during session.    OBJECTIVE IMPAIRMENTS: Abnormal gait, decreased activity tolerance, decreased balance, decreased endurance, decreased mobility, decreased ROM, decreased strength, hypomobility, increased edema, and pain.   ACTIVITY LIMITATIONS: sitting, standing, squatting, and stairs  PARTICIPATION LIMITATIONS: cleaning, driving, shopping, community activity, occupation, and yard work  PERSONAL FACTORS: Age, Profession, and Time since onset of injury/illness/exacerbation are also affecting patient's functional outcome.   REHAB POTENTIAL: Good  CLINICAL DECISION MAKING: Stable/uncomplicated  EVALUATION COMPLEXITY: Low   GOALS: Goals reviewed with patient? No    SHORT TERM GOALS: Target date: 05/25/23 Patient will be independent with performance of HEP Baseline:  Goal status: INITIAL   LONG TERM GOALS: Target date: 06/15/23  Patient will improve LEFS score by 9 points to  demonstrate improved perceived function while meeting MCID.  Baseline: 62/80 = 77.5% Goal status: INITIAL  Patient will improve hip ext ROM to at least 0 degrees to show improved LE mobility for improve functional transfers and QOL.  Baseline: -8 Goal status: INITIAL Patient will improve hip flex ROM to at least 120 degrees to show improved LE mobility for improve functional transfers and QOL.  Baseline: 109 Goal status: INITIAL  Patient will score at least a  5/5 on RLE MMT to show increased LE strength and/or power and improve ambulation/gait mechanics.  Baseline: 4-/5 Goal status: INITIAL Patient will increase gait distance to 63'  with least restrictive assistive device during test to demonstrate improved functional mobility walking household and community distances and improved endurance. Baseline: 420 Goal status: INITIAL   PLAN:  PT FREQUENCY: 2x/week  PT DURATION: 4 weeks  PLANNED INTERVENTIONS: 97164- PT Re-evaluation, 97110-Therapeutic exercises, 97530- Therapeutic activity, 97112- Neuromuscular re-education, 97535- Self Care, 84132- Manual therapy, [phone]- Gait training, Patient/Family education, Balance training, and Stair training  PLAN FOR NEXT SESSION: Hamstring muscle length measurement, assess hip ROM, assess hip ext/abd, assess patellar mobility, Continue knee ROM activities, progress quad and hip flexor strengthening exer.     10:16 AM, 05/18/23 Chryl Heck, PT, DPT [facility] [place]

## 2023-05-22 ENCOUNTER — Ambulatory Visit (HOSPITAL_COMMUNITY)

## 2023-05-22 ENCOUNTER — Encounter (HOSPITAL_COMMUNITY): Payer: Self-pay

## 2023-05-22 DIAGNOSIS — M25561 Pain in right knee: Secondary | ICD-10-CM | POA: Diagnosis not present

## 2023-05-22 DIAGNOSIS — G8929 Other chronic pain: Secondary | ICD-10-CM

## 2023-05-22 NOTE — Therapy (Signed)
 OUTPATIENT PHYSICAL THERAPY LOWER EXTREMITY EVALUATION   Patient Name: Mike Roach MRN: [id number] DOB:[birth year], 83 y.o., male Today's Date: 05/22/2023  END OF SESSION:  PT End of Session - 05/22/23 0843     Visit Number 2    Number of Visits 8    Date for PT Re-Evaluation 06/15/23    Authorization Type [employer] Medicare    Authorization Time Period No auth required    PT Start Time 0845    PT Stop Time 0930    PT Time Calculation (min) 45 min    Activity Tolerance Patient tolerated treatment well    Behavior During Therapy [facility] for tasks assessed/performed             Past Medical History:  Diagnosis Date   Adenomatous colon polyp 09/21/2014   Anemia in chronic illness 03/07/2015   being evaluated   Arthritis    Atrial flutter (HCC)    Bilateral hearing loss 03/15/2015   Cancer of intestine (HCC)    Cataract    Chronic kidney disease (CKD) 03/07/2015   Chronic kidney disease, stage III (moderate) (HCC) 03/07/2015   Chronic right shoulder pain 02/03/2018   GERD (gastroesophageal reflux disease)    GIST (gastrointestinal stroma tumor), malignant, colon (HCC) dx'd 2017   Duodenom,    Glaucoma    both eyes   Hearing impairment    Hypercholesterolemia    Hypertension    Iron deficiency anemia 03/15/2015   Kidney lesion, native, left 07/12/2015   Lateral meniscus derangement 01/12/2013   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   Osteoarthritis of right knee 01/12/2013   Prostate enlargement    Renal cancer, left (HCC) dx'd 2017   Skin lesion of left leg 03/07/2015   Past Surgical History:  Procedure Laterality Date   colonoscopy     COLONOSCOPY     ESOPHAGOGASTRODUODENOSCOPY (EGD) WITH PROPOFOL N/A 06/08/2017   Procedure: ESOPHAGOGASTRODUODENOSCOPY (EGD) WITH PROPOFOL;  Surgeon: Iva Boop, MD;  [facility]: [facility];  Service: Endoscopy;  Laterality: N/A;   EUS N/A 07/05/2015   Procedure: UPPER ENDOSCOPIC ULTRASOUND (EUS) LINEAR;  Surgeon: Rachael Fee, MD;  Location: [facility];  Service: Endoscopy;  Laterality: N/A;   KNEE ARTHROSCOPY WITH LATERAL MENISECTOMY Right 01/21/2013   Procedure: KNEE ARTHROSCOPY WITH PARTIAL LATERAL MENISECTOMY;  Surgeon: Vickki Hearing, MD;  Location: [facility];  Service: Orthopedics;  Laterality: Right;   ORIF ANKLE DISLOCATION Right 15 yrs ago   ROBOTIC ASSITED PARTIAL NEPHRECTOMY Left 10/17/2015   Procedure: XI ROBOTIC ASSITED LEFT PARTIAL NEPHRECTOMY, Left Cyst Decortication, Left Renal Ultrasound;  Surgeon: Sebastian Ache, MD;  Location: [facility];  Service: Urology;  Laterality: Left;   UPPER GASTROINTESTINAL ENDOSCOPY     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 12/25/2020   Pseudophakia of both eyes 11/12/2020   Pericardial effusion 01/05/2019   Chest discomfort 11/05/2018   Chronic right shoulder pain 02/03/2018   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   Biceps tendon rupture, proximal, left, initial encounter 06/01/2017   Hyperlipidemia 03/12/2017   GIST (gastrointestinal stroma tumor), malignant, colon (HCC) 03/12/2017   Deficiency anemia 10/17/2016   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/17/2015 10/19/2015   Hypertension    GERD (gastroesophageal reflux disease)    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, 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 10/17/2015  07/12/2015   Kidney lesion, native, left 07/12/2015   Iron deficiency anemia 03/15/2015   Bilateral hearing loss 03/15/2015   Skin lesion of left leg 03/07/2015   Anemia in chronic illness 03/07/2015   Chronic kidney disease, stage III (moderate) (HCC) 03/07/2015   Chronic kidney disease (CKD) 03/07/2015   Adenomatous colon polyp  09/21/2014   Lateral meniscus derangement 01/12/2013   Osteoarthritis of right knee 01/12/2013    PCP: Lindaann Slough, DO  REFERRING PROVIDER: Vickki Hearing, MD  REFERRING DIAG: [phone] (ICD-10-CM) - Chronic pain of right knee   THERAPY DIAG:  Chronic pain of right knee  Rationale for Evaluation and Treatment: Rehabilitation  ONSET DATE: 1 year  SUBJECTIVE:   SUBJECTIVE STATEMENT: 05/22/23:  Pt stated he is feeling good today.  Has began the exercises without questions.  No reports of pain currently.  Eval:  Patient arrives to Physical therapy for R knee pain. He reports he can tolerate standing the amount of time it takes to cut 3 heads, ~1.5 hours, but it causes his R knee to swell. After standing that long, it hurts to even pick the R leg up to put it in the car. At one point, he started using the bike for exercise and it felt like it was making it worse so he stopped. Reports walking after prolonged standing hurts as well. Reports when the swelling does occur, it lasts a few hours. Rest helps. Doesn't wake him up at night anymore. Describes it as discomfort, not really pain.  PERTINENT HISTORY: Primary OA in R knee Cancer PAIN:  Are you having pain? No  PRECAUTIONS: None  RED FLAGS: None   WEIGHT BEARING RESTRICTIONS: No  FALLS:  Has patient fallen in last 6 months? 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 Been a couple of years.   NEXT MD VISIT: 06/05/23  OBJECTIVE:  Note: Objective measures were completed at Evaluation unless otherwise noted.  DIAGNOSTIC FINDINGS:   Right knee pain osteoarthritis   X-ray shows mild varus alignment lateral compartment gonarthrosis narrowing of the compartment some sclerosis there is an effusion   Impression OA right knee mild valgus with effusion  PATIENT SURVEYS:  LEFS  62/80=77.5%  COGNITION: Overall cognitive status: Within functional limits for tasks assessed     SENSATION: WFL  EDEMA:  None to note on this date but pt reports inc swelling following prolonged standing/sitting  MUSCLE LENGTH: Hamstrings: Right:     , Left: Thomas test: Right:     , Left:  POSTURE: rounded shoulders and increased thoracic kyphosis  PALPATION: N/a. Crepitus felt with Knee ext MMT on R. Pt reports mild pain  LOWER EXTREMITY ROM:  Active ROM Right eval Left eval  Hip flexion 50 42  Hip extension 8 8  Hip abduction    Hip adduction    Hip internal rotation    Hip external rotation 34 36  Knee flexion 109   Knee extension -8   Ankle dorsiflexion [facility]  Ankle plantarflexion    Ankle inversion    Ankle eversion     (Blank rows = not tested)  LOWER EXTREMITY MMT:  MMT Right eval Left eval  Hip flexion 4-/5 5/5  Hip extension 4-/5 4/5  Hip abduction 4/5 4+/5  Hip  adduction    Hip internal rotation    Hip external rotation    Knee flexion 4-/5 5/5  Knee extension 4-/5 5/5  Ankle dorsiflexion 5/5 5/5  Ankle plantarflexion    Ankle inversion    Ankle eversion     (Blank rows = not tested)   FUNCTIONAL TESTS:   30 seconds chair stand test: 7.5, no UE use 2 Minute Walk Test: 420', no AD  GAIT: Distance walked: 2' + to/from exam room Assistive device utilized: None Level of assistance: Complete Independence Comments: Pt ambulates with good speed, no AD, kyphotic posture, slightly ER feet bilaterally, slightly flexed knees throughout                                                                                                                               TREATMENT DATE:  05/22/2023  Reviewed goals Educated importance of HEP compliance with HEP Objective testing including: MMT for glut, hamstring length, hip ROM (see above) Limited patella mobility Manual patella mobility with superior/inferior and R/L  Reviewed HEP: Supine: SAQ  10x 10" Heel slide with strap 5x 10" Bridge 10x 5" Quad sets 10x 5" AROM 7-120 degrees Hamstring stretch with 2x 30"  Educated benefits with thigh high compression garments measurements taken and given handout for ETI      PATIENT EDUCATION:  Education details: PT evaluation, objective findings, POC, Importance of HEP, Precautions, Clinic policies  Person educated: Patient Education method: Explanation, Tactile cues, and Verbal cues Education comprehension: verbalized understanding and returned demonstration  HOME EXERCISE PROGRAM: Access Code: [email or web address] URL: [email or web address] Date: 05/18/2023 Prepared by: Fabiola Backer Powell-Butler  Exercises - Supine Short Arc Quad  - 2 x daily - 7 x weekly - 3 sets - 10 reps - 5 hold - Supine Heel Slide with Strap  - 2 x daily - 7 x weekly - 2 sets - 10 reps - 5 hold - Sit to Stand  - 2 x daily - 7 x weekly - 2 sets - 10 reps  05/22/23: - Supine Bridge  - 2 x daily - 7 x weekly - 2 sets - 10 reps - 5" hold - Hooklying Hamstring Stretch with Strap  - 1 x daily - 7 x weekly - 3 sets - 10 reps  ASSESSMENT:  CLINICAL IMPRESSION: 05/22/23:  Reviewed goals and educated importance of HEP compliance for maximal benefits.  Pt able to recall current exercise program and presents with good form.  Objective testing (see above) revealed gluteal weakness, hamstring tightness and decreased hip and patella mobility.  Exercises added to address impairments.  Pt presents with improve knee mobility with ability to flex knee 7-120 degrees.  Pt educated on benefits of thigh high compression garments to address swelling in knee, measurements taken and given handout for ETI.      Eval:  Patient is a 83 y.o. male who was seen today for physical therapy evaluation and treatment for M25.561,G89.29 (ICD-10-CM) - Chronic  pain of right knee. Patient tolerated session very well. Patient is not experiencing any pain, discomfort, or swelling at start or during  evaluation but reports his usual pain is primarily due to prolonged standing. Patient demonstrates decreased ROM and strength in R knee musculature as well as decreased LE power and/or endurance via the 30 sec STS test which he would benefit from skilled physical therapy to address in order to improve his function and QOL. HEP administered with pt demo good carryover during performance of all during session.    OBJECTIVE IMPAIRMENTS: Abnormal gait, decreased activity tolerance, decreased balance, decreased endurance, decreased mobility, decreased ROM, decreased strength, hypomobility, increased edema, and pain.   ACTIVITY LIMITATIONS: sitting, standing, squatting, and stairs  PARTICIPATION LIMITATIONS: cleaning, driving, shopping, community activity, occupation, and yard work  PERSONAL FACTORS: Age, Profession, and Time since onset of injury/illness/exacerbation are also affecting patient's functional outcome.   REHAB POTENTIAL: Good  CLINICAL DECISION MAKING: Stable/uncomplicated  EVALUATION COMPLEXITY: Low   GOALS: Goals reviewed with patient? No    SHORT TERM GOALS: Target date: 05/25/23 Patient will be independent with performance of HEP Baseline:  Goal status: INITIAL   LONG TERM GOALS: Target date: 06/15/23  Patient will improve LEFS score by 9 points to demonstrate improved perceived function while meeting MCID.  Baseline: 62/80 = 77.5% Goal status: INITIAL Patient will improve hip ext ROM to at least 0 degrees to show improved LE mobility for improve functional transfers and QOL.  Baseline: -8 Goal status: INITIAL Patient will improve hip flex ROM to at least 120 degrees to show improved LE mobility for improve functional transfers and QOL.  Baseline: 109 Goal status: INITIAL  Patient will score at least a  5/5 on RLE MMT to show increased LE strength and/or power and improve ambulation/gait mechanics.  Baseline: 4-/5 Goal status: INITIAL Patient will increase gait  distance to 54'  with least restrictive assistive device during test to demonstrate improved functional mobility walking household and community distances and improved endurance. Baseline: 420 Goal status: INITIAL   PLAN:  PT FREQUENCY: 2x/week  PT DURATION: 4 weeks  PLANNED INTERVENTIONS: 97164- PT Re-evaluation, 97110-Therapeutic exercises, 97530- Therapeutic activity, 97112- Neuromuscular re-education, 97535- Self Care, 16109- Manual therapy, [phone]- Gait training, Patient/Family education, Balance training, and Stair training  PLAN FOR NEXT SESSION: Continue knee ROM activities, progress quad and hip flexor strengthening exer.  Added TKE, heel/toe raises and functional squat.   Becky Sax, LPTA/CLT; CBIS [phone] 9:44 AM, 05/22/23

## 2023-05-27 ENCOUNTER — Ambulatory Visit (HOSPITAL_COMMUNITY): Admitting: Physical Therapy

## 2023-05-27 DIAGNOSIS — G8929 Other chronic pain: Secondary | ICD-10-CM

## 2023-05-27 DIAGNOSIS — M25561 Pain in right knee: Secondary | ICD-10-CM | POA: Diagnosis not present

## 2023-05-27 NOTE — Therapy (Signed)
 OUTPATIENT PHYSICAL THERAPY TREATMENT   Patient Name: Mortimer Bair MRN: [id number] DOB:Mar 11, 1940, 83 y.o., male Today's Date: 05/27/2023  END OF SESSION:  PT End of Session - 05/27/23 0940     Visit Number 3    Number of Visits 8    Date for PT Re-Evaluation 06/15/23    Authorization Type [employer]    Authorization Time Period No auth required    Progress Note Due on Visit 8    PT Start Time 0935    PT Stop Time 1015    PT Time Calculation (min) 40 min    Activity Tolerance Patient tolerated treatment well    Behavior During Therapy [facility] for tasks assessed/performed             Past Medical History:  Diagnosis Date   Adenomatous colon polyp 09/21/2014   Anemia in chronic illness 03/07/2015   being evaluated   Arthritis    Atrial flutter (HCC)    Bilateral hearing loss 03/15/2015   Cancer of intestine (HCC)    Cataract    Chronic kidney disease (CKD) 03/07/2015   Chronic kidney disease, stage III (moderate) (HCC) 03/07/2015   Chronic right shoulder pain 02/03/2018   GERD (gastroesophageal reflux disease)    GIST (gastrointestinal stroma tumor), malignant, colon (HCC) dx'd 2017   Duodenom,    Glaucoma    both eyes   Hearing impairment    Hypercholesterolemia    Hypertension    Iron deficiency anemia 03/15/2015   Kidney lesion, native, left 07/12/2015   Lateral meniscus derangement 01/12/2013   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   Osteoarthritis of right knee 01/12/2013   Prostate enlargement    Renal cancer, left (HCC) dx'd 2017   Skin lesion of left leg 03/07/2015   Past Surgical History:  Procedure Laterality Date   colonoscopy     COLONOSCOPY     ESOPHAGOGASTRODUODENOSCOPY (EGD) WITH PROPOFOL N/A 06/08/2017   Procedure: ESOPHAGOGASTRODUODENOSCOPY (EGD) WITH PROPOFOL;  Surgeon: Iva Boop, MD;  Location: [facility];  Service: Endoscopy;  Laterality: N/A;   EUS N/A 07/05/2015   Procedure: UPPER ENDOSCOPIC ULTRASOUND (EUS) LINEAR;   Surgeon: Rachael Fee, MD;  Location: [facility];  Service: Endoscopy;  Laterality: N/A;   KNEE ARTHROSCOPY WITH LATERAL MENISECTOMY Right 01/21/2013   Procedure: KNEE ARTHROSCOPY WITH PARTIAL LATERAL MENISECTOMY;  Surgeon: Vickki Hearing, MD;  Location: [facility];  Service: Orthopedics;  Laterality: Right;   ORIF ANKLE DISLOCATION Right 15 yrs ago   ROBOTIC ASSITED PARTIAL NEPHRECTOMY Left 10/17/2015   Procedure: XI ROBOTIC ASSITED LEFT PARTIAL NEPHRECTOMY, Left Cyst Decortication, Left Renal Ultrasound;  Surgeon: Sebastian Ache, MD;  Location: [facility];  Service: Urology;  Laterality: Left;   UPPER GASTROINTESTINAL ENDOSCOPY     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 12/25/2020   Pseudophakia of both eyes 11/12/2020   Pericardial effusion 01/05/2019   Chest discomfort 11/05/2018   Chronic right shoulder pain 02/03/2018   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   Biceps tendon rupture, proximal, left, initial encounter 06/01/2017   Hyperlipidemia 03/12/2017   GIST (gastrointestinal stroma tumor), malignant, colon (HCC) 03/12/2017   Deficiency anemia 10/17/2016   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/17/2015 10/19/2015   Hypertension    GERD (gastroesophageal  reflux disease)    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, 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 10/17/2015  07/12/2015   Kidney lesion, native, left 07/12/2015   Iron deficiency anemia 03/15/2015   Bilateral hearing loss 03/15/2015   Skin lesion of left leg 03/07/2015   Anemia in chronic illness 03/07/2015   Chronic kidney disease, stage III (moderate) (HCC) 03/07/2015   Chronic kidney disease (CKD) 03/07/2015   Adenomatous  colon polyp 09/21/2014   Lateral meniscus derangement 01/12/2013   Osteoarthritis of right knee 01/12/2013    PCP: Lindaann Slough, DO  REFERRING PROVIDER: Vickki Hearing, MD  REFERRING DIAG: [id number] (ICD-10-CM) - Chronic pain of right knee   THERAPY DIAG:  Chronic pain of right knee  Rationale for Evaluation and Treatment: Rehabilitation  ONSET DATE: 1 year  SUBJECTIVE:   SUBJECTIVE STATEMENT: 05/27/23:  Pt states he has ordered his stockings but have not gotten them yet.  Reports no pain or issues today. Continues to do his HEP.  Eval:  Patient arrives to Physical therapy for R knee pain. He reports he can tolerate standing the amount of time it takes to cut 3 heads, ~1.5 hours, but it causes his R knee to swell. After standing that long, it hurts to even pick the R leg up to put it in the car. At one point, he started using the bike for exercise and it felt like it was making it worse so he stopped. Reports walking after prolonged standing hurts as well. Reports when the swelling does occur, it lasts a few hours. Rest helps. Doesn't wake him up at night anymore. Describes it as discomfort, not really pain.  PERTINENT HISTORY: Primary OA in R knee Cancer PAIN:  Are you having pain? No  PRECAUTIONS: None  RED FLAGS: None   WEIGHT BEARING RESTRICTIONS: No  FALLS:  Has patient fallen in last 6 months? 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 Been a couple of years.   NEXT MD VISIT: 06/05/23  OBJECTIVE:  Note: Objective measures were completed at Evaluation unless otherwise noted.  DIAGNOSTIC FINDINGS:   Right knee pain osteoarthritis   X-ray shows mild varus alignment lateral compartment gonarthrosis narrowing of the compartment some sclerosis there is an effusion   Impression OA right knee mild valgus with effusion  PATIENT  SURVEYS:  LEFS 62/80=77.5%  COGNITION: Overall cognitive status: Within functional limits for tasks assessed     SENSATION: WFL  EDEMA:  None to note on this date but pt reports inc swelling following prolonged standing/sitting  MUSCLE LENGTH: Hamstrings: Right:     , Left: Thomas test: Right:     , Left:  POSTURE: rounded shoulders and increased thoracic kyphosis  PALPATION: N/a. Crepitus felt with Knee ext MMT on R. Pt reports mild pain  LOWER EXTREMITY ROM:  Active ROM Right eval Left eval  Hip flexion 50 42  Hip extension 8 8  Hip abduction    Hip adduction    Hip internal rotation    Hip external rotation 34 36  Knee flexion 109   Knee extension -8   Ankle dorsiflexion [facility] WFL  Ankle plantarflexion    Ankle inversion    Ankle eversion     (Blank rows = not tested)  LOWER EXTREMITY MMT:  MMT Right eval Left eval  Hip flexion 4-/5 5/5  Hip extension 4-/5 4/5  Hip abduction 4/5 4+/5  Hip adduction    Hip internal rotation    Hip external rotation    Knee flexion 4-/5 5/5  Knee extension 4-/5 5/5  Ankle dorsiflexion 5/5 5/5  Ankle plantarflexion    Ankle inversion    Ankle eversion     (Blank rows = not tested)   FUNCTIONAL TESTS:   30 seconds chair stand test: 7.5, no UE use 2 Minute Walk Test: 420', no AD  GAIT: Distance walked: 54' + to/from exam room Assistive device utilized: None Level of assistance: Complete Independence Comments: Pt ambulates with good speed, no AD, kyphotic posture, slightly ER feet bilaterally, slightly flexed knees throughout                                                                                                                               TREATMENT DATE:  05/27/23 Nustep seat 14 UE/LE level 4  Standing: Heelraises 20X  Lunges onto 4" step  Mini squats 10X   Hip abduction 2X10 each  Hip extension 2X10 each  Rt TKE with GTB therapist assist 10X5" holds  05/22/23 Reviewed goals Educated importance  of HEP compliance with HEP Objective testing including: MMT for glut, hamstring length, hip ROM (see above) Limited patella mobility Manual patella mobility with superior/inferior and R/L  Reviewed HEP: Supine: SAQ 10x 10" Heel slide with strap 5x 10" Bridge 10x 5" Quad sets 10x 5" AROM 7-120 degrees Hamstring stretch with 2x 30"  Educated benefits with thigh high compression garments measurements taken and given handout for ETI      PATIENT EDUCATION:  Education details: PT evaluation, objective findings, POC, Importance of HEP, Precautions, Clinic policies  Person educated: Patient Education method: Explanation, Tactile cues, and Verbal cues Education comprehension: verbalized understanding and returned demonstration  HOME EXERCISE PROGRAM: Access Code: [email or web address] URL: [email or web address] Date: 05/18/2023 Prepared by: Fabiola Backer Powell-Butler  Exercises - Supine Short Arc Quad  - 2 x daily - 7 x weekly - 3 sets - 10 reps - 5 hold - Supine Heel Slide with Strap  - 2 x daily - 7 x weekly - 2 sets - 10 reps - 5 hold - Sit to Stand  - 2 x daily - 7 x weekly - 2 sets - 10 reps  05/22/23: - Supine Bridge  - 2 x daily - 7 x weekly - 2 sets - 10 reps - 5" hold - Hooklying Hamstring Stretch with Strap  - 1 x daily - 7 x weekly - 3 sets - 10 reps  ASSESSMENT:  CLINICAL IMPRESSION: 05/27/23:  Began with nustep for functional warm up then progressed on to standing exercises this session.  Pt with difficulty maintaining upright posturing with tendency to bend forward with downward gaze.  Cues to correct this. Squats were most challenging for form as tends to push his weight forward into toes.  Began hip strengthening in standing with cues to  keep upright posturing, completing task more slowly/controlled.     Instructed to bring in his stockings if finds he is having trouble donning or doffing to problem solve.     Eval:  Patient is a 83 y.o. male who was seen today for  physical therapy evaluation and treatment for M25.561,G89.29 (ICD-10-CM) - Chronic pain of right knee. Patient tolerated session very well. Patient is not experiencing any pain, discomfort, or swelling at start or during evaluation but reports his usual pain is primarily due to prolonged standing. Patient demonstrates decreased ROM and strength in R knee musculature as well as decreased LE power and/or endurance via the 30 sec STS test which he would benefit from skilled physical therapy to address in order to improve his function and QOL. HEP administered with pt demo good carryover during performance of all during session.    OBJECTIVE IMPAIRMENTS: Abnormal gait, decreased activity tolerance, decreased balance, decreased endurance, decreased mobility, decreased ROM, decreased strength, hypomobility, increased edema, and pain.   ACTIVITY LIMITATIONS: sitting, standing, squatting, and stairs  PARTICIPATION LIMITATIONS: cleaning, driving, shopping, community activity, occupation, and yard work  PERSONAL FACTORS: Age, Profession, and Time since onset of injury/illness/exacerbation are also affecting patient's functional outcome.   REHAB POTENTIAL: Good  CLINICAL DECISION MAKING: Stable/uncomplicated  EVALUATION COMPLEXITY: Low   GOALS: Goals reviewed with patient? No    SHORT TERM GOALS: Target date: 05/25/23 Patient will be independent with performance of HEP Baseline:  Goal status: INITIAL   LONG TERM GOALS: Target date: 06/15/23  Patient will improve LEFS score by 9 points to demonstrate improved perceived function while meeting MCID.  Baseline: 62/80 = 77.5% Goal status: INITIAL Patient will improve hip ext ROM to at least 0 degrees to show improved LE mobility for improve functional transfers and QOL.  Baseline: -8 Goal status: INITIAL Patient will improve hip flex ROM to at least 120 degrees to show improved LE mobility for improve functional transfers and QOL.  Baseline:  109 Goal status: INITIAL  Patient will score at least a  5/5 on RLE MMT to show increased LE strength and/or power and improve ambulation/gait mechanics.  Baseline: 4-/5 Goal status: INITIAL Patient will increase gait distance to 29'  with least restrictive assistive device during test to demonstrate improved functional mobility walking household and community distances and improved endurance. Baseline: 420 Goal status: INITIAL   PLAN:  PT FREQUENCY: 2x/week  PT DURATION: 4 weeks  PLANNED INTERVENTIONS: 97164- PT Re-evaluation, 97110-Therapeutic exercises, 97530- Therapeutic activity, 97112- Neuromuscular re-education, 97535- Self Care, 13086- Manual therapy, [id number]- Gait training, Patient/Family education, Balance training, and Stair training  PLAN FOR NEXT SESSION: Continue knee ROM activities, progress quad and hip flexor strengthening exercises.  Lurena Nida, PTA/CLT [facility] Ph: [phone]  10:25 AM, 05/27/23

## 2023-05-28 ENCOUNTER — Ambulatory Visit (HOSPITAL_COMMUNITY): Admitting: Physical Therapy

## 2023-05-28 DIAGNOSIS — M25561 Pain in right knee: Secondary | ICD-10-CM | POA: Diagnosis not present

## 2023-05-28 DIAGNOSIS — G8929 Other chronic pain: Secondary | ICD-10-CM

## 2023-05-28 NOTE — Therapy (Signed)
 OUTPATIENT PHYSICAL THERAPY TREATMENT   Patient Name: Mike Roach MRN: [id number] DOB:[birth year], 83 y.o., male Today's Date: 05/28/2023  END OF SESSION:  PT End of Session - 05/28/23 1225     Visit Number 4    Number of Visits 8    Date for PT Re-Evaluation 06/15/23    Authorization Type [employer]    Authorization Time Period No auth required    Progress Note Due on Visit 8    PT Start Time 1148    PT Stop Time 1228    PT Time Calculation (min) 40 min    Activity Tolerance Patient tolerated treatment well    Behavior During Therapy WFL for tasks assessed/performed              Past Medical History:  Diagnosis Date   Adenomatous colon polyp 09/21/2014   Anemia in chronic illness 03/07/2015   being evaluated   Arthritis    Atrial flutter (HCC)    Bilateral hearing loss 03/15/2015   Cancer of intestine (HCC)    Cataract    Chronic kidney disease (CKD) 03/07/2015   Chronic kidney disease, stage III (moderate) (HCC) 03/07/2015   Chronic right shoulder pain 02/03/2018   GERD (gastroesophageal reflux disease)    GIST (gastrointestinal stroma tumor), malignant, colon (HCC) dx'd 2017   Duodenom,    Glaucoma    both eyes   Hearing impairment    Hypercholesterolemia    Hypertension    Iron deficiency anemia 03/15/2015   Kidney lesion, native, left 07/12/2015   Lateral meniscus derangement 01/12/2013   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   Osteoarthritis of right knee 01/12/2013   Prostate enlargement    Renal cancer, left (HCC) dx'd 2017   Skin lesion of left leg 03/07/2015   Past Surgical History:  Procedure Laterality Date   colonoscopy     COLONOSCOPY     ESOPHAGOGASTRODUODENOSCOPY (EGD) WITH PROPOFOL N/A 06/08/2017   Procedure: ESOPHAGOGASTRODUODENOSCOPY (EGD) WITH PROPOFOL;  Surgeon: Iva Boop, MD;  Location: [facility];  Service: Endoscopy;  Laterality: N/A;   EUS N/A 07/05/2015   Procedure: UPPER ENDOSCOPIC ULTRASOUND (EUS) LINEAR;   Surgeon: Rachael Fee, MD;  Location: [facility];  Service: Endoscopy;  Laterality: N/A;   KNEE ARTHROSCOPY WITH LATERAL MENISECTOMY Right 01/21/2013   Procedure: KNEE ARTHROSCOPY WITH PARTIAL LATERAL MENISECTOMY;  Surgeon: Vickki Hearing, MD;  Location: [facility];  Service: Orthopedics;  Laterality: Right;   ORIF ANKLE DISLOCATION Right 15 yrs ago   ROBOTIC ASSITED PARTIAL NEPHRECTOMY Left 10/17/2015   Procedure: XI ROBOTIC ASSITED LEFT PARTIAL NEPHRECTOMY, Left Cyst Decortication, Left Renal Ultrasound;  Surgeon: Sebastian Ache, MD;  Location: [facility];  Service: Urology;  Laterality: Left;   UPPER GASTROINTESTINAL ENDOSCOPY     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 12/25/2020   Pseudophakia of both eyes 11/12/2020   Pericardial effusion 01/05/2019   Chest discomfort 11/05/2018   Chronic right shoulder pain 02/03/2018   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   Biceps tendon rupture, proximal, left, initial encounter 06/01/2017   Hyperlipidemia 03/12/2017   GIST (gastrointestinal stroma tumor), malignant, colon (HCC) 03/12/2017   Deficiency anemia 10/17/2016   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/17/2015 10/19/2015   Hypertension    GERD (  gastroesophageal reflux disease)    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, 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 10/17/2015  07/12/2015   Kidney lesion, native, left 07/12/2015   Iron deficiency anemia 03/15/2015   Bilateral hearing loss 03/15/2015   Skin lesion of left leg 03/07/2015   Anemia in chronic illness 03/07/2015   Chronic kidney disease, stage III (moderate) (HCC) 03/07/2015   Chronic kidney disease (CKD) 03/07/2015   Adenomatous  colon polyp 09/21/2014   Lateral meniscus derangement 01/12/2013   Osteoarthritis of right knee 01/12/2013    PCP: Lindaann Slough, DO  REFERRING PROVIDER: Vickki Hearing, MD  REFERRING DIAG: [phone] (ICD-10-CM) - Chronic pain of right knee   THERAPY DIAG:  Chronic pain of right knee  Rationale for Evaluation and Treatment: Rehabilitation  ONSET DATE: 1 year  SUBJECTIVE:   SUBJECTIVE STATEMENT: 05/28/23:  Pt states no soreness no pain.   Eval:  Patient arrives to Physical therapy for R knee pain. He reports he can tolerate standing the amount of time it takes to cut 3 heads, ~1.5 hours, but it causes his R knee to swell. After standing that long, it hurts to even pick the R leg up to put it in the car. At one point, he started using the bike for exercise and it felt like it was making it worse so he stopped. Reports walking after prolonged standing hurts as well. Reports when the swelling does occur, it lasts a few hours. Rest helps. Doesn't wake him up at night anymore. Describes it as discomfort, not really pain.  PERTINENT HISTORY: Primary OA in R knee Cancer PAIN:  Are you having pain? No  PRECAUTIONS: None  RED FLAGS: None   WEIGHT BEARING RESTRICTIONS: No  FALLS:  Has patient fallen in last 6 months? 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 Been a couple of years.   NEXT MD VISIT: 06/05/23  OBJECTIVE:  Note: Objective measures were completed at Evaluation unless otherwise noted.  DIAGNOSTIC FINDINGS:   Right knee pain osteoarthritis   X-ray shows mild varus alignment lateral compartment gonarthrosis narrowing of the compartment some sclerosis there is an effusion   Impression OA right knee mild valgus with effusion  PATIENT SURVEYS:  LEFS 62/80=77.5%  COGNITION: Overall cognitive status: Within functional limits for  tasks assessed     SENSATION: WFL  EDEMA:  None to note on this date but pt reports inc swelling following prolonged standing/sitting  MUSCLE LENGTH: Hamstrings: Right:     , Left: Thomas test: Right:     , Left:  POSTURE: rounded shoulders and increased thoracic kyphosis  PALPATION: N/a. Crepitus felt with Knee ext MMT on R. Pt reports mild pain  LOWER EXTREMITY ROM:  Active ROM Right eval Left eval  Hip flexion 50 42  Hip extension 8 8  Hip abduction    Hip adduction    Hip internal rotation    Hip external rotation 34 36  Knee flexion 109   Knee extension -8   Ankle dorsiflexion [facility]  Ankle plantarflexion    Ankle inversion    Ankle eversion     (Blank rows = not tested)  LOWER EXTREMITY MMT:  MMT Right eval Left eval  Hip flexion 4-/5 5/5  Hip extension 4-/5 4/5  Hip abduction 4/5 4+/5  Hip adduction    Hip  internal rotation    Hip external rotation    Knee flexion 4-/5 5/5  Knee extension 4-/5 5/5  Ankle dorsiflexion 5/5 5/5  Ankle plantarflexion    Ankle inversion    Ankle eversion     (Blank rows = not tested)   FUNCTIONAL TESTS:   30 seconds chair stand test: 7.5, no UE use 2 Minute Walk Test: 420', no AD  GAIT: Distance walked: 54' + to/from exam room Assistive device utilized: None Level of assistance: Complete Independence Comments: Pt ambulates with good speed, no AD, kyphotic posture, slightly ER feet bilaterally, slightly flexed knees throughout                                                                                                                               TREATMENT DATE: 05/28/23 Nustep seat 14 UE/LE level 4  Standing: Heelraises 20X  Toe raises 20X  Lunges onto 4" step 2X10 no UE assist  Mini squats 10X 2 sets  Hip abduction 2X10 each  Hip extension 2X10 each  Rt TKE with GTB therapist assist 10X5" holds 4" lateral step ups 2X10 each 6" forward step ups 2X10 each Sit to stands with airex under feet 5X no  UE standard chair    05/27/23 Nustep seat 14 UE/LE level 4  Standing: Heelraises 20X  Lunges onto 4" step  Mini squats 10X   Hip abduction 2X10 each  Hip extension 2X10 each  Rt TKE with GTB therapist assist 10X5" holds  05/22/23 Reviewed goals Educated importance of HEP compliance with HEP Objective testing including: MMT for glut, hamstring length, hip ROM (see above) Limited patella mobility Manual patella mobility with superior/inferior and R/L  Reviewed HEP: Supine: SAQ 10x 10" Heel slide with strap 5x 10" Bridge 10x 5" Quad sets 10x 5" AROM 7-120 degrees Hamstring stretch with 2x 30"  Educated benefits with thigh high compression garments measurements taken and given handout for ETI      PATIENT EDUCATION:  Education details: PT evaluation, objective findings, POC, Importance of HEP, Precautions, Clinic policies  Person educated: Patient Education method: Explanation, Tactile cues, and Verbal cues Education comprehension: verbalized understanding and returned demonstration  HOME EXERCISE PROGRAM: Access Code: [email or web address] URL: [email or web address] Date: 05/18/2023 Prepared by: Fabiola Backer Powell-Butler  Exercises - Supine Short Arc Quad  - 2 x daily - 7 x weekly - 3 sets - 10 reps - 5 hold - Supine Heel Slide with Strap  - 2 x daily - 7 x weekly - 2 sets - 10 reps - 5 hold - Sit to Stand  - 2 x daily - 7 x weekly - 2 sets - 10 reps  05/22/23: - Supine Bridge  - 2 x daily - 7 x weekly - 2 sets - 10 reps - 5" hold - Hooklying Hamstring Stretch with Strap  - 1 x daily - 7 x weekly - 3 sets - 10 reps  ASSESSMENT:  CLINICAL IMPRESSION: 05/28/23:  Pt with slightly improved posture this session requiring less cues with activities, however does continue to maintain downward gaze.  Difficutly keeping count of repetitions, completing more than instructed.  Added forward and lateral step ups without pain or issues.  Toe raises revealed dorsiflexion weakness. Squats  continue to be challenging for form requiring constant cues. Increased difficulty of sit to stands with added airex beneath LE's. Descending harder to control than standing. No rest breaks needed this session. Still have not received compression stockings but should get them by tomorrow (Instructed to bring in his stockings if finds he is having trouble donning or doffing to problem solve).     Eval:  Patient is a 83 y.o. male who was seen today for physical therapy evaluation and treatment for M25.561,G89.29 (ICD-10-CM) - Chronic pain of right knee. Patient tolerated session very well. Patient is not experiencing any pain, discomfort, or swelling at start or during evaluation but reports his usual pain is primarily due to prolonged standing. Patient demonstrates decreased ROM and strength in R knee musculature as well as decreased LE power and/or endurance via the 30 sec STS test which he would benefit from skilled physical therapy to address in order to improve his function and QOL. HEP administered with pt demo good carryover during performance of all during session.    OBJECTIVE IMPAIRMENTS: Abnormal gait, decreased activity tolerance, decreased balance, decreased endurance, decreased mobility, decreased ROM, decreased strength, hypomobility, increased edema, and pain.   ACTIVITY LIMITATIONS: sitting, standing, squatting, and stairs  PARTICIPATION LIMITATIONS: cleaning, driving, shopping, community activity, occupation, and yard work  PERSONAL FACTORS: Age, Profession, and Time since onset of injury/illness/exacerbation are also affecting patient's functional outcome.   REHAB POTENTIAL: Good  CLINICAL DECISION MAKING: Stable/uncomplicated  EVALUATION COMPLEXITY: Low   GOALS: Goals reviewed with patient? No    SHORT TERM GOALS: Target date: 05/25/23 Patient will be independent with performance of HEP Baseline:  Goal status: INITIAL   LONG TERM GOALS: Target date: 06/15/23  Patient  will improve LEFS score by 9 points to demonstrate improved perceived function while meeting MCID.  Baseline: 62/80 = 77.5% Goal status: INITIAL Patient will improve hip ext ROM to at least 0 degrees to show improved LE mobility for improve functional transfers and QOL.  Baseline: -8 Goal status: INITIAL Patient will improve hip flex ROM to at least 120 degrees to show improved LE mobility for improve functional transfers and QOL.  Baseline: 109 Goal status: INITIAL  Patient will score at least a  5/5 on RLE MMT to show increased LE strength and/or power and improve ambulation/gait mechanics.  Baseline: 4-/5 Goal status: INITIAL Patient will increase gait distance to 58'  with least restrictive assistive device during test to demonstrate improved functional mobility walking household and community distances and improved endurance. Baseline: 420 Goal status: INITIAL   PLAN:  PT FREQUENCY: 2x/week  PT DURATION: 4 weeks  PLANNED INTERVENTIONS: 97164- PT Re-evaluation, 97110-Therapeutic exercises, 97530- Therapeutic activity, 97112- Neuromuscular re-education, 97535- Self Care, 16109- Manual therapy, [id number]- Gait training, Patient/Family education, Balance training, and Stair training  PLAN FOR NEXT SESSION: Continue knee ROM activities, progress quad and hip flexor strengthening exercises.  Lurena Nida, PTA/CLT [facility] Ph: [phone]  12:25 PM, 05/28/23

## 2023-06-01 ENCOUNTER — Ambulatory Visit (HOSPITAL_COMMUNITY): Admitting: Physical Therapy

## 2023-06-01 DIAGNOSIS — M25561 Pain in right knee: Secondary | ICD-10-CM | POA: Diagnosis not present

## 2023-06-01 DIAGNOSIS — G8929 Other chronic pain: Secondary | ICD-10-CM

## 2023-06-01 NOTE — Therapy (Signed)
 OUTPATIENT PHYSICAL THERAPY TREATMENT   Patient Name: Mike Roach MRN: [id number] DOB:1940/11/13, 83 y.o., male Today's Date: 06/01/2023  END OF SESSION:  PT End of Session - 06/01/23 0935     Visit Number 5    Number of Visits 8    Date for PT Re-Evaluation 06/15/23    Authorization Type [employer]    Authorization Time Period No auth required    Progress Note Due on Visit 8    PT Start Time 0800    PT Stop Time 0845    PT Time Calculation (min) 45 min    Activity Tolerance Patient tolerated treatment well    Behavior During Therapy [facility] for tasks assessed/performed               Past Medical History:  Diagnosis Date   Adenomatous colon polyp 09/21/2014   Anemia in chronic illness 03/07/2015   being evaluated   Arthritis    Atrial flutter (HCC)    Bilateral hearing loss 03/15/2015   Cancer of intestine (HCC)    Cataract    Chronic kidney disease (CKD) 03/07/2015   Chronic kidney disease, stage III (moderate) (HCC) 03/07/2015   Chronic right shoulder pain 02/03/2018   GERD (gastroesophageal reflux disease)    GIST (gastrointestinal stroma tumor), malignant, colon (HCC) dx'd 2017   Duodenom,    Glaucoma    both eyes   Hearing impairment    Hypercholesterolemia    Hypertension    Iron deficiency anemia 03/15/2015   Kidney lesion, native, left 07/12/2015   Lateral meniscus derangement 01/12/2013   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   Osteoarthritis of right knee 01/12/2013   Prostate enlargement    Renal cancer, left (HCC) dx'd 2017   Skin lesion of left leg 03/07/2015   Past Surgical History:  Procedure Laterality Date   colonoscopy     COLONOSCOPY     ESOPHAGOGASTRODUODENOSCOPY (EGD) WITH PROPOFOL N/A 06/08/2017   Procedure: ESOPHAGOGASTRODUODENOSCOPY (EGD) WITH PROPOFOL;  Surgeon: Iva Boop, MD;  Location: [facility];  Service: Endoscopy;  Laterality: N/A;   EUS N/A 07/05/2015   Procedure: UPPER ENDOSCOPIC ULTRASOUND (EUS) LINEAR;   Surgeon: Rachael Fee, MD;  Location: [facility];  Service: Endoscopy;  Laterality: N/A;   KNEE ARTHROSCOPY WITH LATERAL MENISECTOMY Right 01/21/2013   Procedure: KNEE ARTHROSCOPY WITH PARTIAL LATERAL MENISECTOMY;  Surgeon: Vickki Hearing, MD;  Location: [facility];  Service: Orthopedics;  Laterality: Right;   ORIF ANKLE DISLOCATION Right 15 yrs ago   ROBOTIC ASSITED PARTIAL NEPHRECTOMY Left 10/17/2015   Procedure: XI ROBOTIC ASSITED LEFT PARTIAL NEPHRECTOMY, Left Cyst Decortication, Left Renal Ultrasound;  Surgeon: Sebastian Ache, MD;  Location: [facility];  Service: Urology;  Laterality: Left;   UPPER GASTROINTESTINAL ENDOSCOPY     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 12/25/2020   Pseudophakia of both eyes 11/12/2020   Pericardial effusion 01/05/2019   Chest discomfort 11/05/2018   Chronic right shoulder pain 02/03/2018   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   Biceps tendon rupture, proximal, left, initial encounter 06/01/2017   Hyperlipidemia 03/12/2017   GIST (gastrointestinal stroma tumor), malignant, colon (HCC) 03/12/2017   Deficiency anemia 10/17/2016   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/17/2015 10/19/2015   Hypertension  GERD (gastroesophageal reflux disease)    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, 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 10/17/2015  07/12/2015   Kidney lesion, native, left 07/12/2015   Iron deficiency anemia 03/15/2015   Bilateral hearing loss 03/15/2015   Skin lesion of left leg 03/07/2015   Anemia in chronic illness 03/07/2015   Chronic kidney disease, stage III (moderate) (HCC) 03/07/2015   Chronic kidney disease (CKD) 03/07/2015   Adenomatous  colon polyp 09/21/2014   Lateral meniscus derangement 01/12/2013   Osteoarthritis of right knee 01/12/2013    PCP: Lindaann Slough, DO  REFERRING PROVIDER: Vickki Hearing, MD  REFERRING DIAG: [phone] (ICD-10-CM) - Chronic pain of right knee   THERAPY DIAG:  Chronic pain of right knee  Rationale for Evaluation and Treatment: Rehabilitation  ONSET DATE: 1 year  SUBJECTIVE:   SUBJECTIVE STATEMENT: 06/01/23:  Pt comes today with thigh high compression donned.  States he wore them to cut hair Saturday 7-2 and thinks they really helped. No issues donning/doffing. Currently without any pain.    Eval:  Patient arrives to Physical therapy for R knee pain. He reports he can tolerate standing the amount of time it takes to cut 3 heads, ~1.5 hours, but it causes his R knee to swell. After standing that long, it hurts to even pick the R leg up to put it in the car. At one point, he started using the bike for exercise and it felt like it was making it worse so he stopped. Reports walking after prolonged standing hurts as well. Reports when the swelling does occur, it lasts a few hours. Rest helps. Doesn't wake him up at night anymore. Describes it as discomfort, not really pain.  PERTINENT HISTORY: Primary OA in R knee Cancer PAIN:  Are you having pain? No  PRECAUTIONS: None  RED FLAGS: None   WEIGHT BEARING RESTRICTIONS: No  FALLS:  Has patient fallen in last 6 months? 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 Been a couple of years.   NEXT MD VISIT: 06/05/23  OBJECTIVE:  Note: Objective measures were completed at Evaluation unless otherwise noted.  DIAGNOSTIC FINDINGS:   Right knee pain osteoarthritis   X-ray shows mild varus alignment lateral compartment gonarthrosis narrowing of the compartment some sclerosis there is an effusion    Impression OA right knee mild valgus with effusion  PATIENT SURVEYS:  LEFS 62/80=77.5%  COGNITION: Overall cognitive status: Within functional limits for tasks assessed     SENSATION: WFL  EDEMA:  None to note on this date but pt reports inc swelling following prolonged standing/sitting  MUSCLE LENGTH: Hamstrings: Right:     , Left: Thomas test: Right:     , Left:  POSTURE: rounded shoulders and increased thoracic kyphosis  PALPATION: N/a. Crepitus felt with Knee ext MMT on R. Pt reports mild pain  LOWER EXTREMITY ROM:  Active ROM Right eval Left eval  Hip flexion 50 42  Hip extension 8 8  Hip abduction    Hip adduction    Hip internal rotation    Hip external rotation 34 36  Knee flexion 109   Knee extension -8   Ankle dorsiflexion [facility] WFL  Ankle plantarflexion    Ankle inversion    Ankle eversion     (Blank rows = not tested)  LOWER EXTREMITY MMT:  MMT  Right eval Left eval  Hip flexion 4-/5 5/5  Hip extension 4-/5 4/5  Hip abduction 4/5 4+/5  Hip adduction    Hip internal rotation    Hip external rotation    Knee flexion 4-/5 5/5  Knee extension 4-/5 5/5  Ankle dorsiflexion 5/5 5/5  Ankle plantarflexion    Ankle inversion    Ankle eversion     (Blank rows = not tested)   FUNCTIONAL TESTS:   30 seconds chair stand test: 7.5, no UE use 2 Minute Walk Test: 420', no AD  GAIT: Distance walked: 36' + to/from exam room Assistive device utilized: None Level of assistance: Complete Independence Comments: Pt ambulates with good speed, no AD, kyphotic posture, slightly ER feet bilaterally, slightly flexed knees throughout                                                                                                                               TREATMENT DATE: 06/01/23 Nustep seat 14 UE/LE level 4  Standing: Heelraises 20X  Toe raises 20X  Lunges onto 4" step 2X10 no UE assist  Mini squats 10X 2 sets  Hip abduction 2X10 each  Hip extension  2X10 each  Rt TKE with GTB therapist assist 10X5" holds 4" lateral step ups 2X10 each 6" forward step ups 2X10 each  05/28/23 Nustep seat 14 UE/LE level 4  Standing: Heelraises 20X  Toe raises 20X  Lunges onto 4" step 2X10 no UE assist  March hold 3" alternating with 1 UE assist 20X  Mini squats 10X 2 sets  Hip abduction 20X each  Hip extension 20X each  4" lateral step ups 2X10 each 6" forward step ups 2X10 each Wall sits 10X5"     05/27/23 Nustep seat 14 UE/LE level 4  Standing: Heelraises 20X  Lunges onto 4" step  Mini squats 10X   Hip abduction 2X10 each  Hip extension 2X10 each  Rt TKE with GTB therapist assist 10X5" holds  05/22/23 Reviewed goals Educated importance of HEP compliance with HEP Objective testing including: MMT for glut, hamstring length, hip ROM (see above) Limited patella mobility Manual patella mobility with superior/inferior and R/L  Reviewed HEP: Supine: SAQ 10x 10" Heel slide with strap 5x 10" Bridge 10x 5" Quad sets 10x 5" AROM 7-120 degrees Hamstring stretch with 2x 30"  Educated benefits with thigh high compression garments measurements taken and given handout for ETI      PATIENT EDUCATION:  Education details: PT evaluation, objective findings, POC, Importance of HEP, Precautions, Clinic policies  Person educated: Patient Education method: Explanation, Tactile cues, and Verbal cues Education comprehension: verbalized understanding and returned demonstration  HOME EXERCISE PROGRAM: Access Code: [email or web address] URL: [email or web address] Date: 05/18/2023 Prepared by: Fabiola Backer Powell-Butler  Exercises - Supine Short Arc Quad  - 2 x daily - 7 x weekly - 3 sets - 10 reps - 5 hold - Supine Heel Slide with Strap  - 2 x daily -  7 x weekly - 2 sets - 10 reps - 5 hold - Sit to Stand  - 2 x daily - 7 x weekly - 2 sets - 10 reps  05/22/23: - Supine Bridge  - 2 x daily - 7 x weekly - 2 sets - 10 reps - 5" hold - Hooklying Hamstring  Stretch with Strap  - 1 x daily - 7 x weekly - 3 sets - 10 reps  ASSESSMENT:  CLINICAL IMPRESSION: 06/01/23:  pt reports overall improvement today.  Continued with current program, increasing reps/decreasing UE assist where able. Cues needed for form with squats and lunges.  Added march holds with 5" holds to focus in on weak hip flexors.  No rest breaks needed this session. Therapist inspected fit of thigh high compression stockings with good fit and pt reporting comfort and ease of donning/doffing. Pt will continue to benefit from skilled therapy to progress towards goals and reduce pain and dysfunction.     Eval:  Patient is a 83 y.o. male who was seen today for physical therapy evaluation and treatment for M25.561,G89.29 (ICD-10-CM) - Chronic pain of right knee. Patient tolerated session very well. Patient is not experiencing any pain, discomfort, or swelling at start or during evaluation but reports his usual pain is primarily due to prolonged standing. Patient demonstrates decreased ROM and strength in R knee musculature as well as decreased LE power and/or endurance via the 30 sec STS test which he would benefit from skilled physical therapy to address in order to improve his function and QOL. HEP administered with pt demo good carryover during performance of all during session.    OBJECTIVE IMPAIRMENTS: Abnormal gait, decreased activity tolerance, decreased balance, decreased endurance, decreased mobility, decreased ROM, decreased strength, hypomobility, increased edema, and pain.   ACTIVITY LIMITATIONS: sitting, standing, squatting, and stairs  PARTICIPATION LIMITATIONS: cleaning, driving, shopping, community activity, occupation, and yard work  PERSONAL FACTORS: Age, Profession, and Time since onset of injury/illness/exacerbation are also affecting patient's functional outcome.   REHAB POTENTIAL: Good  CLINICAL DECISION MAKING: Stable/uncomplicated  EVALUATION COMPLEXITY:  Low   GOALS: Goals reviewed with patient? No    SHORT TERM GOALS: Target date: 05/25/23 Patient will be independent with performance of HEP Baseline:  Goal status: INITIAL   LONG TERM GOALS: Target date: 06/15/23  Patient will improve LEFS score by 9 points to demonstrate improved perceived function while meeting MCID.  Baseline: 62/80 = 77.5% Goal status: INITIAL Patient will improve hip ext ROM to at least 0 degrees to show improved LE mobility for improve functional transfers and QOL.  Baseline: -8 Goal status: INITIAL Patient will improve hip flex ROM to at least 120 degrees to show improved LE mobility for improve functional transfers and QOL.  Baseline: 109 Goal status: INITIAL  Patient will score at least a  5/5 on RLE MMT to show increased LE strength and/or power and improve ambulation/gait mechanics.  Baseline: 4-/5 Goal status: INITIAL Patient will increase gait distance to 15'  with least restrictive assistive device during test to demonstrate improved functional mobility walking household and community distances and improved endurance. Baseline: 420 Goal status: INITIAL   PLAN:  PT FREQUENCY: 2x/week  PT DURATION: 4 weeks  PLANNED INTERVENTIONS: 97164- PT Re-evaluation, 97110-Therapeutic exercises, 97530- Therapeutic activity, 97112- Neuromuscular re-education, 97535- Self Care, 16109- Manual therapy, [phone]- Gait training, Patient/Family education, Balance training, and Stair training  PLAN FOR NEXT SESSION: Continue knee ROM activities, progress quad and hip flexor strengthening exercises.  Kullen Tomasetti  Kae Heller, PTA/CLT [facility] Ph: [phone]  9:36 AM, 06/01/23

## 2023-06-04 ENCOUNTER — Encounter (HOSPITAL_COMMUNITY)

## 2023-06-05 ENCOUNTER — Ambulatory Visit: Payer: Medicare HMO | Admitting: Orthopedic Surgery

## 2023-06-05 ENCOUNTER — Telehealth: Payer: Self-pay | Admitting: Orthopedic Surgery

## 2023-06-05 DIAGNOSIS — M7121 Synovial cyst of popliteal space [Baker], right knee: Secondary | ICD-10-CM | POA: Diagnosis not present

## 2023-06-05 DIAGNOSIS — M1711 Unilateral primary osteoarthritis, right knee: Secondary | ICD-10-CM

## 2023-06-05 NOTE — Progress Notes (Signed)
   There were no vitals taken for this visit.  There is no height or weight on file to calculate BMI.  Chief Complaint  Patient presents with   Follow-up    Right knee follow up - exercise helped.    Encounter Diagnosis  Name Primary?   Primary osteoarthritis of right knee Yes    DOI/DOS/ Date:    Unchanged  Still has swelling on back of knee prevents from doing anything

## 2023-06-05 NOTE — Telephone Encounter (Signed)
 Dr. Mort Sawyers pt Mike Roach w/[facility] [phone] ext [phone] lvm at 12:02pm stating the pt has an appointment on 4/08 and she wants to know if his insurance requires authorization.

## 2023-06-05 NOTE — Progress Notes (Signed)
   There were no vitals taken for this visit.  There is no height or weight on file to calculate BMI.  Chief Complaint  Patient presents with   Follow-up    Right knee follow up - exercise helped.    Encounter Diagnoses  Name Primary?   Primary osteoarthritis of right knee Yes   Baker's cyst of knee, right     DOI/DOS/ Date:    Slightly improved  Still has swelling on back of knee prevents from doing anything  83 year old male with osteoarthritis of the right knee sent for physical therapy which helped his mobility and helped his ability to get out of a chair  However, he still complains of a fullness and tightness in the back of his knee with intermittent fluid  Exam findings include  Swelling in the popliteal fossa consistent with Baker's cyst  Recommend ultrasound-guided aspiration  Patient scheduled  Follow-up if no improvement

## 2023-06-08 NOTE — Telephone Encounter (Signed)
 Not that code I sent email to Rinaldo Cloud to let charlotte know

## 2023-06-08 NOTE — Addendum Note (Signed)
 Addended byCaffie Damme on: 06/08/2023 10:33 AM   Modules accepted: Orders

## 2023-06-09 ENCOUNTER — Encounter (HOSPITAL_COMMUNITY): Payer: Self-pay

## 2023-06-09 ENCOUNTER — Other Ambulatory Visit

## 2023-06-09 ENCOUNTER — Ambulatory Visit (HOSPITAL_COMMUNITY): Attending: Orthopedic Surgery

## 2023-06-09 DIAGNOSIS — G8929 Other chronic pain: Secondary | ICD-10-CM | POA: Insufficient documentation

## 2023-06-09 DIAGNOSIS — M25561 Pain in right knee: Secondary | ICD-10-CM | POA: Insufficient documentation

## 2023-06-09 NOTE — Therapy (Signed)
 OUTPATIENT PHYSICAL THERAPY TREATMENT   Patient Name: Mike Roach MRN: [id number] DOB:December 22, 1940, 83 y.o., male Today's Date: 06/09/2023  END OF SESSION:  PT End of Session - 06/09/23 0933     Visit Number 6    Number of Visits 8    Date for PT Re-Evaluation 06/15/23    Authorization Type [employer]    Authorization Time Period No auth required    Progress Note Due on Visit 8    PT Start Time 0933    PT Stop Time 1015    PT Time Calculation (min) 42 min    Activity Tolerance Patient tolerated treatment well    Behavior During Therapy [facility] for tasks assessed/performed               Past Medical History:  Diagnosis Date   Adenomatous colon polyp 09/21/2014   Anemia in chronic illness 03/07/2015   being evaluated   Arthritis    Atrial flutter (HCC)    Bilateral hearing loss 03/15/2015   Cancer of intestine (HCC)    Cataract    Chronic kidney disease (CKD) 03/07/2015   Chronic kidney disease, stage III (moderate) (HCC) 03/07/2015   Chronic right shoulder pain 02/03/2018   GERD (gastroesophageal reflux disease)    GIST (gastrointestinal stroma tumor), malignant, colon (HCC) dx'd 2017   Duodenom,    Glaucoma    both eyes   Hearing impairment    Hypercholesterolemia    Hypertension    Iron deficiency anemia 03/15/2015   Kidney lesion, native, left 07/12/2015   Lateral meniscus derangement 01/12/2013   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   Osteoarthritis of right knee 01/12/2013   Prostate enlargement    Renal cancer, left (HCC) dx'd 2017   Skin lesion of left leg 03/07/2015   Past Surgical History:  Procedure Laterality Date   colonoscopy     COLONOSCOPY     ESOPHAGOGASTRODUODENOSCOPY (EGD) WITH PROPOFOL N/A 06/08/2017   Procedure: ESOPHAGOGASTRODUODENOSCOPY (EGD) WITH PROPOFOL;  Surgeon: Iva Boop, MD;  Location: [facility];  Service: Endoscopy;  Laterality: N/A;   EUS N/A 07/05/2015   Procedure: UPPER ENDOSCOPIC ULTRASOUND (EUS) LINEAR;   Surgeon: Rachael Fee, MD;  Location: [facility];  Service: Endoscopy;  Laterality: N/A;   KNEE ARTHROSCOPY WITH LATERAL MENISECTOMY Right 01/21/2013   Procedure: KNEE ARTHROSCOPY WITH PARTIAL LATERAL MENISECTOMY;  Surgeon: Vickki Hearing, MD;  Location: [facility];  Service: Orthopedics;  Laterality: Right;   ORIF ANKLE DISLOCATION Right 15 yrs ago   ROBOTIC ASSITED PARTIAL NEPHRECTOMY Left 10/17/2015   Procedure: XI ROBOTIC ASSITED LEFT PARTIAL NEPHRECTOMY, Left Cyst Decortication, Left Renal Ultrasound;  Surgeon: Sebastian Ache, MD;  Location: [facility];  Service: Urology;  Laterality: Left;   UPPER GASTROINTESTINAL ENDOSCOPY     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 12/25/2020   Pseudophakia of both eyes 11/12/2020   Pericardial effusion 01/05/2019   Chest discomfort 11/05/2018   Chronic right shoulder pain 02/03/2018   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   Biceps tendon rupture, proximal, left, initial encounter 06/01/2017   Hyperlipidemia 03/12/2017   GIST (gastrointestinal stroma tumor), malignant, colon (HCC) 03/12/2017   Deficiency anemia 10/17/2016   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/17/2015 10/19/2015   Hypertension  GERD (gastroesophageal reflux disease)    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, 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 10/17/2015  07/12/2015   Kidney lesion, native, left 07/12/2015   Iron deficiency anemia 03/15/2015   Bilateral hearing loss 03/15/2015   Skin lesion of left leg 03/07/2015   Anemia in chronic illness 03/07/2015   Chronic kidney disease, stage III (moderate) (HCC) 03/07/2015   Chronic kidney disease (CKD) 03/07/2015   Adenomatous  colon polyp 09/21/2014   Lateral meniscus derangement 01/12/2013   Osteoarthritis of right knee 01/12/2013    PCP: Lindaann Slough, DO  REFERRING PROVIDER: Vickki Hearing, MD  REFERRING DIAG: [phone] (ICD-10-CM) - Chronic pain of right knee   THERAPY DIAG:  Chronic pain of right knee  Rationale for Evaluation and Treatment: Rehabilitation  ONSET DATE: 1 year  SUBJECTIVE:   SUBJECTIVE STATEMENT: 06/09/23:  Pt stated he feels the compression garments are helpful with swelling and edema control.  No reports of pain currently. Going to have some imagining done on 4/18 in [place].    Eval:  Patient arrives to Physical therapy for R knee pain. He reports he can tolerate standing the amount of time it takes to cut 3 heads, ~1.5 hours, but it causes his R knee to swell. After standing that long, it hurts to even pick the R leg up to put it in the car. At one point, he started using the bike for exercise and it felt like it was making it worse so he stopped. Reports walking after prolonged standing hurts as well. Reports when the swelling does occur, it lasts a few hours. Rest helps. Doesn't wake him up at night anymore. Describes it as discomfort, not really pain.  PERTINENT HISTORY: Primary OA in R knee Cancer PAIN:  Are you having pain? No  PRECAUTIONS: None  RED FLAGS: None   WEIGHT BEARING RESTRICTIONS: No  FALLS:  Has patient fallen in last 6 months? 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 Been a couple of years.   NEXT MD VISIT: 06/05/23  OBJECTIVE:  Note: Objective measures were completed at Evaluation unless otherwise noted.  DIAGNOSTIC FINDINGS:   Right knee pain osteoarthritis   X-ray shows mild varus alignment lateral compartment gonarthrosis narrowing of the compartment some sclerosis there is an effusion   Impression OA  right knee mild valgus with effusion  PATIENT SURVEYS:  LEFS 62/80=77.5%  COGNITION: Overall cognitive status: Within functional limits for tasks assessed     SENSATION: WFL  EDEMA:  None to note on this date but pt reports inc swelling following prolonged standing/sitting  MUSCLE LENGTH: Hamstrings: Right:     , Left: Thomas test: Right:     , Left:  POSTURE: rounded shoulders and increased thoracic kyphosis  PALPATION: N/a. Crepitus felt with Knee ext MMT on R. Pt reports mild pain  LOWER EXTREMITY ROM:  Active ROM Right eval Left eval  Hip flexion 50 42  Hip extension 8 8  Hip abduction    Hip adduction    Hip internal rotation    Hip external rotation 34 36  Knee flexion 109   Knee extension -8   Ankle dorsiflexion [facility] WFL  Ankle plantarflexion    Ankle inversion    Ankle eversion     (Blank rows = not tested)  LOWER EXTREMITY MMT:  MMT  Right eval Left eval  Hip flexion 4-/5 5/5  Hip extension 4-/5 4/5  Hip abduction 4/5 4+/5  Hip adduction    Hip internal rotation    Hip external rotation    Knee flexion 4-/5 5/5  Knee extension 4-/5 5/5  Ankle dorsiflexion 5/5 5/5  Ankle plantarflexion    Ankle inversion    Ankle eversion     (Blank rows = not tested)   FUNCTIONAL TESTS:   30 seconds chair stand test: 7.5, no UE use 2 Minute Walk Test: 420', no AD  GAIT: Distance walked: 3' + to/from exam room Assistive device utilized: None Level of assistance: Complete Independence Comments: Pt ambulates with good speed, no AD, kyphotic posture, slightly ER feet bilaterally, slightly flexed knees throughout                                                                                                                               TREATMENT DATE: 06/09/23 Sit to stand with cueing for eccentric control 2x 10 Standing:  Toe tapping alternating 6in step height with opposite UE swing Heel raises 20x incline slope 5" holds Toe raises 20x decline slope  5" holds Forward step up 6in step height with 1 HHR x 20 6" box lateral step up x 20 each Vector stance 5x 3" Squat 10x 2 sets Sidestep 2RT RTB around thighs     06/01/23 Nustep seat 14 UE/LE level 4  Standing: Heelraises 20X  Toe raises 20X  Lunges onto 4" step 2X10 no UE assist  Mini squats 10X 2 sets  Hip abduction 2X10 each  Hip extension 2X10 each  Rt TKE with GTB therapist assist 10X5" holds 4" lateral step ups 2X10 each 6" forward step ups 2X10 each  05/28/23 Nustep seat 14 UE/LE level 4  Standing: Heelraises 20X  Toe raises 20X  Lunges onto 4" step 2X10 no UE assist  March hold 3" alternating with 1 UE assist 20X  Mini squats 10X 2 sets  Hip abduction 20X each  Hip extension 20X each  4" lateral step ups 2X10 each 6" forward step ups 2X10 each Wall sits 10X5"     05/27/23 Nustep seat 14 UE/LE level 4  Standing: Heelraises 20X  Lunges onto 4" step  Mini squats 10X   Hip abduction 2X10 each  Hip extension 2X10 each  Rt TKE with GTB therapist assist 10X5" holds  05/22/23 Reviewed goals Educated importance of HEP compliance with HEP Objective testing including: MMT for glut, hamstring length, hip ROM (see above) Limited patella mobility Manual patella mobility with superior/inferior and R/L  Reviewed HEP: Supine: SAQ 10x 10" Heel slide with strap 5x 10" Bridge 10x 5" Quad sets 10x 5" AROM 7-120 degrees Hamstring stretch with 2x 30"  Educated benefits with thigh high compression garments measurements taken and given handout for ETI      PATIENT EDUCATION:  Education details: PT evaluation, objective findings, POC, Importance of HEP, Precautions,  Clinic policies  Person educated: Patient Education method: Explanation, Tactile cues, and Verbal cues Education comprehension: verbalized understanding and returned demonstration  HOME EXERCISE PROGRAM: Access Code: [email or web address] URL: [email or web address] Date: 05/18/2023 Prepared by: Fabiola Backer  [place]  Exercises - Supine Short Arc Quad  - 2 x daily - 7 x weekly - 3 sets - 10 reps - 5 hold - Supine Heel Slide with Strap  - 2 x daily - 7 x weekly - 2 sets - 10 reps - 5 hold - Sit to Stand  - 2 x daily - 7 x weekly - 2 sets - 10 reps  05/22/23: - Supine Bridge  - 2 x daily - 7 x weekly - 2 sets - 10 reps - 5" hold - Hooklying Hamstring Stretch with Strap  - 1 x daily - 7 x weekly - 3 sets - 10 reps  ASSESSMENT:  CLINICAL IMPRESSION: 06/09/23:  Added glut engaging exercises to assist with balance and gait with cueing for form and to increase hold times with new exercises including sidestep and vector stance.   Pt required cueing for posture and form with squats and to improve sequence with opposite UE/LE activities.  No reports of pain through session and fatigue levels are appropriate.    Eval:  Patient is a 83 y.o. male who was seen today for physical therapy evaluation and treatment for M25.561,G89.29 (ICD-10-CM) - Chronic pain of right knee. Patient tolerated session very well. Patient is not experiencing any pain, discomfort, or swelling at start or during evaluation but reports his usual pain is primarily due to prolonged standing. Patient demonstrates decreased ROM and strength in R knee musculature as well as decreased LE power and/or endurance via the 30 sec STS test which he would benefit from skilled physical therapy to address in order to improve his function and QOL. HEP administered with pt demo good carryover during performance of all during session.    OBJECTIVE IMPAIRMENTS: Abnormal gait, decreased activity tolerance, decreased balance, decreased endurance, decreased mobility, decreased ROM, decreased strength, hypomobility, increased edema, and pain.   ACTIVITY LIMITATIONS: sitting, standing, squatting, and stairs  PARTICIPATION LIMITATIONS: cleaning, driving, shopping, community activity, occupation, and yard work  PERSONAL FACTORS: Age, Profession, and Time  since onset of injury/illness/exacerbation are also affecting patient's functional outcome.   REHAB POTENTIAL: Good  CLINICAL DECISION MAKING: Stable/uncomplicated  EVALUATION COMPLEXITY: Low   GOALS: Goals reviewed with patient? No    SHORT TERM GOALS: Target date: 05/25/23 Patient will be independent with performance of HEP Baseline:  Goal status: INITIAL   LONG TERM GOALS: Target date: 06/15/23  Patient will improve LEFS score by 9 points to demonstrate improved perceived function while meeting MCID.  Baseline: 62/80 = 77.5% Goal status: INITIAL Patient will improve hip ext ROM to at least 0 degrees to show improved LE mobility for improve functional transfers and QOL.  Baseline: -8 Goal status: INITIAL Patient will improve hip flex ROM to at least 120 degrees to show improved LE mobility for improve functional transfers and QOL.  Baseline: 109 Goal status: INITIAL  Patient will score at least a  5/5 on RLE MMT to show increased LE strength and/or power and improve ambulation/gait mechanics.  Baseline: 4-/5 Goal status: INITIAL Patient will increase gait distance to 78'  with least restrictive assistive device during test to demonstrate improved functional mobility walking household and community distances and improved endurance. Baseline: 420 Goal status: INITIAL   PLAN:  PT FREQUENCY: 2x/week  PT DURATION: 4  weeks  PLANNED INTERVENTIONS: 97164- PT Re-evaluation, 97110-Therapeutic exercises, 97530- Therapeutic activity, [id number]- Neuromuscular re-education, 97535- Self Care, [id number]- Manual therapy, [phone]- Gait training, Patient/Family education, Balance training, and Stair training  PLAN FOR NEXT SESSION: Continue knee ROM activities, progress quad and hip flexor strengthening exercises.   Becky Sax, LPTA/CLT; CBIS [phone]  10:31 AM, 06/09/23

## 2023-06-11 ENCOUNTER — Ambulatory Visit (HOSPITAL_COMMUNITY): Admitting: Physical Therapy

## 2023-06-11 DIAGNOSIS — G8929 Other chronic pain: Secondary | ICD-10-CM

## 2023-06-11 DIAGNOSIS — M25561 Pain in right knee: Secondary | ICD-10-CM | POA: Diagnosis not present

## 2023-06-11 NOTE — Therapy (Signed)
 OUTPATIENT PHYSICAL THERAPY TREATMENT   Patient Name: Mike Roach MRN: [id number] DOB:1940-04-18, 83 y.o., male Today's Date: 06/11/2023  END OF SESSION:  PT End of Session - 06/11/23 1019     Visit Number 7    Number of Visits 8    Date for PT Re-Evaluation 06/15/23    Authorization Type [employer]    Authorization Time Period No auth required    Progress Note Due on Visit 8    PT Start Time 0933    PT Stop Time 1018    PT Time Calculation (min) 45 min    Activity Tolerance Patient tolerated treatment well    Behavior During Therapy [facility] for tasks assessed/performed                Past Medical History:  Diagnosis Date   Adenomatous colon polyp 09/21/2014   Anemia in chronic illness 03/07/2015   being evaluated   Arthritis    Atrial flutter (HCC)    Bilateral hearing loss 03/15/2015   Cancer of intestine (HCC)    Cataract    Chronic kidney disease (CKD) 03/07/2015   Chronic kidney disease, stage III (moderate) (HCC) 03/07/2015   Chronic right shoulder pain 02/03/2018   GERD (gastroesophageal reflux disease)    GIST (gastrointestinal stroma tumor), malignant, colon (HCC) dx'd 2017   Duodenom,    Glaucoma    both eyes   Hearing impairment    Hypercholesterolemia    Hypertension    Iron deficiency anemia 03/15/2015   Kidney lesion, native, left 07/12/2015   Lateral meniscus derangement 01/12/2013   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   Osteoarthritis of right knee 01/12/2013   Prostate enlargement    Renal cancer, left (HCC) dx'd 2017   Skin lesion of left leg 03/07/2015   Past Surgical History:  Procedure Laterality Date   colonoscopy     COLONOSCOPY     ESOPHAGOGASTRODUODENOSCOPY (EGD) WITH PROPOFOL N/A 06/08/2017   Procedure: ESOPHAGOGASTRODUODENOSCOPY (EGD) WITH PROPOFOL;  Surgeon: Iva Boop, MD;  Location: [facility];  Service: Endoscopy;  Laterality: N/A;   EUS N/A 07/05/2015   Procedure: UPPER ENDOSCOPIC ULTRASOUND (EUS) LINEAR;   Surgeon: Rachael Fee, MD;  Location: [facility];  Service: Endoscopy;  Laterality: N/A;   KNEE ARTHROSCOPY WITH LATERAL MENISECTOMY Right 01/21/2013   Procedure: KNEE ARTHROSCOPY WITH PARTIAL LATERAL MENISECTOMY;  Surgeon: Vickki Hearing, MD;  Location: [facility];  Service: Orthopedics;  Laterality: Right;   ORIF ANKLE DISLOCATION Right 15 yrs ago   ROBOTIC ASSITED PARTIAL NEPHRECTOMY Left 10/17/2015   Procedure: XI ROBOTIC ASSITED LEFT PARTIAL NEPHRECTOMY, Left Cyst Decortication, Left Renal Ultrasound;  Surgeon: Sebastian Ache, MD;  Location: [facility];  Service: Urology;  Laterality: Left;   UPPER GASTROINTESTINAL ENDOSCOPY     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 12/25/2020   Pseudophakia of both eyes 11/12/2020   Pericardial effusion 01/05/2019   Chest discomfort 11/05/2018   Chronic right shoulder pain 02/03/2018   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   Biceps tendon rupture, proximal, left, initial encounter 06/01/2017   Hyperlipidemia 03/12/2017   GIST (gastrointestinal stroma tumor), malignant, colon (HCC) 03/12/2017   Deficiency anemia 10/17/2016   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/17/2015 10/19/2015   Hypertension  GERD (gastroesophageal reflux disease)    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, 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 10/17/2015  07/12/2015   Kidney lesion, native, left 07/12/2015   Iron deficiency anemia 03/15/2015   Bilateral hearing loss 03/15/2015   Skin lesion of left leg 03/07/2015   Anemia in chronic illness 03/07/2015   Chronic kidney disease, stage III (moderate) (HCC) 03/07/2015   Chronic kidney disease (CKD) 03/07/2015   Adenomatous  colon polyp 09/21/2014   Lateral meniscus derangement 01/12/2013   Osteoarthritis of right knee 01/12/2013    PCP: Lindaann Slough, DO  REFERRING PROVIDER: Vickki Hearing, MD  REFERRING DIAG: [phone] (ICD-10-CM) - Chronic pain of right knee   THERAPY DIAG:  Chronic pain of right knee  Rationale for Evaluation and Treatment: Rehabilitation  ONSET DATE: 1 year  SUBJECTIVE:   SUBJECTIVE STATEMENT: 06/11/23:  Pt states he is at least 75% improved.  Currently without pain.  Going to have some imaging done on his Rt knee 4/18 in [place].    Eval:  Patient arrives to Physical therapy for R knee pain. He reports he can tolerate standing the amount of time it takes to cut 3 heads, ~1.5 hours, but it causes his R knee to swell. After standing that long, it hurts to even pick the R leg up to put it in the car. At one point, he started using the bike for exercise and it felt like it was making it worse so he stopped. Reports walking after prolonged standing hurts as well. Reports when the swelling does occur, it lasts a few hours. Rest helps. Doesn't wake him up at night anymore. Describes it as discomfort, not really pain.  PERTINENT HISTORY: Primary OA in R knee Cancer PAIN:  Are you having pain? No  PRECAUTIONS: None  RED FLAGS: None   WEIGHT BEARING RESTRICTIONS: No  FALLS:  Has patient fallen in last 6 months? 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 Been a couple of years.   NEXT MD VISIT: 06/05/23  OBJECTIVE:  Note: Objective measures were completed at Evaluation unless otherwise noted.  DIAGNOSTIC FINDINGS:   Right knee pain osteoarthritis   X-ray shows mild varus alignment lateral compartment gonarthrosis narrowing of the compartment some sclerosis there is an effusion   Impression OA right knee mild valgus with  effusion  PATIENT SURVEYS:  LEFS 62/80=77.5%  COGNITION: Overall cognitive status: Within functional limits for tasks assessed     SENSATION: WFL  EDEMA:  None to note on this date but pt reports inc swelling following prolonged standing/sitting  MUSCLE LENGTH: Hamstrings: Right:     , Left: Thomas test: Right:     , Left:  POSTURE: rounded shoulders and increased thoracic kyphosis  PALPATION: N/a. Crepitus felt with Knee ext MMT on R. Pt reports mild pain  LOWER EXTREMITY ROM:  Active ROM Right eval Left eval  Hip flexion 50 42  Hip extension 8 8  Hip abduction    Hip adduction    Hip internal rotation    Hip external rotation 34 36  Knee flexion 109   Knee extension -8   Ankle dorsiflexion [facility] WFL  Ankle plantarflexion    Ankle inversion    Ankle eversion     (Blank rows = not tested)  LOWER EXTREMITY MMT:  MMT Right eval Left eval  Hip flexion 4-/5 5/5  Hip extension 4-/5 4/5  Hip abduction 4/5 4+/5  Hip adduction    Hip internal rotation    Hip external rotation    Knee flexion 4-/5 5/5  Knee extension 4-/5 5/5  Ankle dorsiflexion 5/5 5/5  Ankle plantarflexion    Ankle inversion    Ankle eversion     (Blank rows = not tested)   FUNCTIONAL TESTS:   30 seconds chair stand test: 7.5, no UE use 2 Minute Walk Test: 420', no AD  GAIT: Distance walked: 84' + to/from exam room Assistive device utilized: None Level of assistance: Complete Independence Comments: Pt ambulates with good speed, no AD, kyphotic posture, slightly ER feet bilaterally, slightly flexed knees throughout                                                                                                                               TREATMENT DATE: 06/11/23 Nustep seat 14 UE/LE seat 4 8 minute warm up Standing:  forward step ups with 6" 2X10 1 UE assist  Vector stance 10X3" each LE with 1 UE assist  Squats 2X10  Sidestepping 2Rt on 20 ft line GTB around thighs Sit to stands  no UE 10X Leg press 3Pl 2X10, eccentric lowering  06/09/23 Sit to stand with cueing for eccentric control 2x 10 Standing:  Toe tapping alternating 6in step height with opposite UE swing Heel raises 20x incline slope 5" holds Toe raises 20x decline slope 5" holds Forward step up 6in step height with 1 HHR x 20 6" box lateral step up x 20 each Vector stance 5x 3" Squat 10x 2 sets Sidestep 2RT RTB around thighs   06/01/23 Nustep seat 14 UE/LE level 4  Standing: Heelraises 20X  Toe raises 20X  Lunges onto 4" step 2X10 no UE assist  Mini squats 10X 2 sets  Hip abduction 2X10 each  Hip extension 2X10 each  Rt TKE with GTB therapist assist 10X5" holds 4" lateral step ups 2X10 each 6" forward step ups 2X10 each  05/28/23 Nustep seat 14 UE/LE level 4  Standing: Heelraises 20X  Toe raises 20X  Lunges onto 4" step 2X10 no UE assist  March hold 3" alternating with 1 UE assist 20X  Mini squats 10X 2 sets  Hip abduction 20X each  Hip extension 20X each  4" lateral step ups 2X10 each 6" forward step ups 2X10 each Wall sits 10X5"     05/27/23 Nustep seat 14 UE/LE level 4  Standing: Heelraises 20X  Lunges onto 4" step  Mini squats 10X   Hip abduction 2X10 each  Hip extension 2X10 each  Rt TKE with GTB therapist assist 10X5" holds  05/22/23 Reviewed goals Educated importance of HEP compliance with HEP Objective testing including: MMT for glut, hamstring length, hip ROM (see above) Limited patella mobility Manual patella mobility with superior/inferior and R/L  Reviewed HEP: Supine: SAQ 10x 10" Heel slide with strap  5x 10" Bridge 10x 5" Quad sets 10x 5" AROM 7-120 degrees Hamstring stretch with 2x 30"  Educated benefits with thigh high compression garments measurements taken and given handout for ETI      PATIENT EDUCATION:  Education details: PT evaluation, objective findings, POC, Importance of HEP, Precautions, Clinic policies  Person educated: Patient Education  method: Explanation, Tactile cues, and Verbal cues Education comprehension: verbalized understanding and returned demonstration  HOME EXERCISE PROGRAM: Access Code: [email or web address] URL: [email or web address] Date: 05/18/2023 Prepared by: Fabiola Backer Powell-Butler  Exercises - Supine Short Arc Quad  - 2 x daily - 7 x weekly - 3 sets - 10 reps - 5 hold - Supine Heel Slide with Strap  - 2 x daily - 7 x weekly - 2 sets - 10 reps - 5 hold - Sit to Stand  - 2 x daily - 7 x weekly - 2 sets - 10 reps  05/22/23: - Supine Bridge  - 2 x daily - 7 x weekly - 2 sets - 10 reps - 5" hold - Hooklying Hamstring Stretch with Strap  - 1 x daily - 7 x weekly - 3 sets - 10 reps  ASSESSMENT:  CLINICAL IMPRESSION: 06/11/23: continued focus on glut engaging, general LE and balance activities. Progressed resistance to green band for sidestep and increased reps of vector stance both without issues.   Pt required cueing for posture and form with squats, especially with eccentric lowering as tends to sit too quickly lacking mm control.  No reports of pain through session and fatigue levels are appropriate. Pt is overall improving with subjective improvement of "at least 75%".    Eval:  Patient is a 83 y.o. male who was seen today for physical therapy evaluation and treatment for M25.561,G89.29 (ICD-10-CM) - Chronic pain of right knee. Patient tolerated session very well. Patient is not experiencing any pain, discomfort, or swelling at start or during evaluation but reports his usual pain is primarily due to prolonged standing. Patient demonstrates decreased ROM and strength in R knee musculature as well as decreased LE power and/or endurance via the 30 sec STS test which he would benefit from skilled physical therapy to address in order to improve his function and QOL. HEP administered with pt demo good carryover during performance of all during session.    OBJECTIVE IMPAIRMENTS: Abnormal gait, decreased activity  tolerance, decreased balance, decreased endurance, decreased mobility, decreased ROM, decreased strength, hypomobility, increased edema, and pain.   ACTIVITY LIMITATIONS: sitting, standing, squatting, and stairs  PARTICIPATION LIMITATIONS: cleaning, driving, shopping, community activity, occupation, and yard work  PERSONAL FACTORS: Age, Profession, and Time since onset of injury/illness/exacerbation are also affecting patient's functional outcome.   REHAB POTENTIAL: Good  CLINICAL DECISION MAKING: Stable/uncomplicated  EVALUATION COMPLEXITY: Low   GOALS: Goals reviewed with patient? No    SHORT TERM GOALS: Target date: 05/25/23 Patient will be independent with performance of HEP Baseline:  Goal status: INITIAL   LONG TERM GOALS: Target date: 06/15/23  Patient will improve LEFS score by 9 points to demonstrate improved perceived function while meeting MCID.  Baseline: 62/80 = 77.5% Goal status: INITIAL Patient will improve hip ext ROM to at least 0 degrees to show improved LE mobility for improve functional transfers and QOL.  Baseline: -8 Goal status: INITIAL Patient will improve hip flex ROM to at least 120 degrees to show improved LE mobility for improve functional transfers and QOL.  Baseline: 109 Goal status: INITIAL  Patient will score at least a  5/5  on RLE MMT to show increased LE strength and/or power and improve ambulation/gait mechanics.  Baseline: 4-/5 Goal status: INITIAL Patient will increase gait distance to 6'  with least restrictive assistive device during test to demonstrate improved functional mobility walking household and community distances and improved endurance. Baseline: 420 Goal status: INITIAL   PLAN:  PT FREQUENCY: 2x/week  PT DURATION: 4 weeks  PLANNED INTERVENTIONS: 97164- PT Re-evaluation, 97110-Therapeutic exercises, 97530- Therapeutic activity, 97112- Neuromuscular re-education, 97535- Self Care, 29562- Manual therapy, [phone]- Gait  training, Patient/Family education, Balance training, and Stair training  PLAN FOR NEXT SESSION: Continue knee ROM activities, progress quad and hip flexor strengthening exercises.  Complete reassessment next session.   Lurena Nida, PTA/CLT [facility] Ph: [phone]  10:19 AM, 06/11/23

## 2023-06-16 ENCOUNTER — Encounter (HOSPITAL_COMMUNITY): Admitting: Physical Therapy

## 2023-06-18 ENCOUNTER — Encounter (HOSPITAL_COMMUNITY)

## 2023-06-19 ENCOUNTER — Ambulatory Visit
Admission: RE | Admit: 2023-06-19 | Discharge: 2023-06-19 | Disposition: A | Discharge status: Home / Self Care | Source: Ambulatory Visit | Attending: Orthopedic Surgery | Admitting: Orthopedic Surgery

## 2023-06-19 DIAGNOSIS — M7121 Synovial cyst of popliteal space [Baker], right knee: Secondary | ICD-10-CM

## 2023-06-19 DIAGNOSIS — M1711 Unilateral primary osteoarthritis, right knee: Secondary | ICD-10-CM

## 2023-06-22 ENCOUNTER — Telehealth: Payer: Self-pay | Admitting: Orthopedic Surgery

## 2023-06-22 NOTE — Telephone Encounter (Signed)
 Dr. Delfino Fellers pt - spoke w/the pt twice this morning, once to schedule, he scheduled an appt w/Dr. H for 5/01, fluid on both legs.  Then he called back and wanted me to make Dr. Marvina Slough aware that when he went for his Ultrasound on 4/18, the back of his knees looked normal, like no fluid, but when he got home they started swelling up again.  He stated that the ultrasound people were going to contact Dr. Marvina Slough for him to have another appointment w/them.  Pt just wanted Dr. Marvina Slough to be aware.

## 2023-07-02 ENCOUNTER — Ambulatory Visit: Admitting: Orthopedic Surgery

## 2023-07-02 DIAGNOSIS — R600 Localized edema: Secondary | ICD-10-CM

## 2023-07-02 DIAGNOSIS — M1711 Unilateral primary osteoarthritis, right knee: Secondary | ICD-10-CM

## 2023-07-02 DIAGNOSIS — G8929 Other chronic pain: Secondary | ICD-10-CM

## 2023-07-02 DIAGNOSIS — M7121 Synovial cyst of popliteal space [Baker], right knee: Secondary | ICD-10-CM

## 2023-07-02 NOTE — Progress Notes (Signed)
   There were no vitals taken for this visit.  There is no height or weight on file to calculate BMI.  Chief Complaint  Patient presents with   Knee Pain    States now right knee is swollen and lower legs swollen     No diagnosis found.  DOI/DOS/ Date: ongoing   Unchanged  Had a large suprapatellar knee joint effusion. Therefore,[facility] proceeded with knee joint effusion aspiration and steroid injection performed under ultrasound.  On 06/19/23 right knee

## 2023-07-02 NOTE — Progress Notes (Signed)
 Patient ID: Mike Roach, male   DOB: [birth year], 83 y.o.   MRN: [id number]   This is a follow-up visit for this 83 year old male with osteoarthritis of his right knee  We sent him for evaluation of Baker's cyst via ultrasound.  Ultrasound did not show a Baker's cyst but large knee joint effusion and therefore an aspiration was done and cortisone injection was performed  The patient has also been undergoing physical therapy to help with his proximal hip girdle strength which has helped  After the aspiration injection the patient says he has been moving well as if it was 12 months ago.  He has a history of renal cell carcinoma left kidney partial nephrectomy in 2017 he has chronic kidney disease stage III  He is complaining really has bilateral leg edema  Currently he has no significant leg edema he is wearing compression stockings  I think his fluid accumulations are secondary to the CKD  Therefore I have advised him to elevate his legs at the end of the day Continue wear compression stockings  Knee aspiration seems to be doing well and that can be repeated as needed  Encounter Diagnoses  Name Primary?   Chronic pain of right knee Yes   Baker's cyst of knee, right    Primary osteoarthritis of right knee    Bilateral leg edema

## 2023-07-02 NOTE — Patient Instructions (Signed)
 Elevate the legs   Wear compression stockings

## 2023-08-17 ENCOUNTER — Encounter (HOSPITAL_COMMUNITY): Payer: Self-pay | Admitting: Sports Medicine

## 2023-08-18 ENCOUNTER — Other Ambulatory Visit (HOSPITAL_COMMUNITY): Payer: Self-pay | Admitting: Sports Medicine

## 2023-08-18 DIAGNOSIS — M1711 Unilateral primary osteoarthritis, right knee: Secondary | ICD-10-CM

## 2023-08-24 ENCOUNTER — Ambulatory Visit (HOSPITAL_COMMUNITY)
Admission: RE | Admit: 2023-08-24 | Discharge: 2023-08-24 | Disposition: A | Discharge status: Home / Self Care | Source: Ambulatory Visit | Attending: Sports Medicine | Admitting: Sports Medicine

## 2023-08-24 DIAGNOSIS — M1711 Unilateral primary osteoarthritis, right knee: Secondary | ICD-10-CM | POA: Insufficient documentation

## 2023-09-23 ENCOUNTER — Ambulatory Visit: Payer: Self-pay | Admitting: Emergency Medicine

## 2023-09-23 DIAGNOSIS — G8929 Other chronic pain: Secondary | ICD-10-CM

## 2023-09-23 NOTE — H&P (Signed)
 TOTAL KNEE ADMISSION H&P  Patient is being admitted for right total knee arthroplasty.  Subjective:  Chief Complaint:right knee pain.  HPI: Mike Roach, 83 y.o. male, has a history of pain and functional disability in the right knee due to arthritis and has failed non-surgical conservative treatments for greater than 12 weeks to includeNSAID's and/or analgesics, corticosteriod injections, viscosupplementation injections, use of assistive devices, and activity modification.  Onset of symptoms was gradual, starting >10 years ago with gradually worsening course since that time. The patient noted prior procedures on the knee to include  arthroscopy on the right knee(s).  Patient currently rates pain in the right knee(s) at 8 out of 10 with activity. Patient has night pain, worsening of pain with activity and weight bearing, pain that interferes with activities of daily living, and pain with passive range of motion.  Patient has evidence of periarticular osteophytes and joint space narrowing by imaging studies. There is no active infection.  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 12/25/2020   Pseudophakia of both eyes 11/12/2020   Pericardial effusion 01/05/2019   Chest discomfort 11/05/2018   Chronic right shoulder pain 02/03/2018   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   Biceps tendon rupture, proximal, left, initial encounter 06/01/2017   Hyperlipidemia 03/12/2017   GIST (gastrointestinal stroma tumor), malignant, colon (HCC) 03/12/2017   Deficiency anemia 10/17/2016   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/17/2015 10/19/2015   Hypertension    GERD (gastroesophageal reflux disease)    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, 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 10/17/2015  07/12/2015   Kidney lesion, native, left 07/12/2015   Iron deficiency anemia 03/15/2015   Bilateral hearing loss 03/15/2015   Skin lesion of left leg 03/07/2015   Anemia in chronic illness 03/07/2015   Chronic kidney disease, stage III (moderate) (HCC) 03/07/2015   Chronic kidney disease (CKD) 03/07/2015   Adenomatous colon polyp 09/21/2014   Lateral meniscus derangement 01/12/2013   Osteoarthritis of right knee 01/12/2013   Past Medical History:  Diagnosis Date   Adenomatous colon polyp 09/21/2014   Anemia in chronic illness 03/07/2015   being evaluated   Arthritis    Atrial flutter (HCC)    Bilateral hearing loss 03/15/2015   Cancer of intestine (HCC)    Cataract    Chronic kidney disease (CKD) 03/07/2015   Chronic kidney disease, stage III (moderate) (HCC) 03/07/2015   Chronic right shoulder pain 02/03/2018   GERD (gastroesophageal reflux disease)    GIST (gastrointestinal stroma tumor), malignant, colon (HCC) dx'd 2017   Duodenom,    Glaucoma    both eyes   Hearing impairment    Hypercholesterolemia    Hypertension    Iron deficiency anemia 03/15/2015   Kidney lesion, native, left 07/12/2015   Lateral meniscus derangement 01/12/2013   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   Osteoarthritis of right knee 01/12/2013   Prostate enlargement    Renal cancer, left (HCC) dx'd 2017   Skin lesion of left leg 03/07/2015    Past Surgical History:  Procedure Laterality Date   colonoscopy     COLONOSCOPY     ESOPHAGOGASTRODUODENOSCOPY (EGD) WITH PROPOFOL  N/A 06/08/2017   Procedure: ESOPHAGOGASTRODUODENOSCOPY (EGD) WITH PROPOFOL ;  Surgeon: Avram Lupita BRAVO,  MD;  Location: [facility];  Service: Endoscopy;  Laterality: N/A;   EUS N/A 07/05/2015   Procedure: UPPER ENDOSCOPIC ULTRASOUND (EUS) LINEAR;  Surgeon: Toribio SHAUNNA Cedar, MD;  Location: [facility];  Service: Endoscopy;  Laterality: N/A;    KNEE ARTHROSCOPY WITH LATERAL MENISECTOMY Right 01/21/2013   Procedure: KNEE ARTHROSCOPY WITH PARTIAL LATERAL MENISECTOMY;  Surgeon: Taft FORBES Minerva, MD;  Location: [facility];  Service: Orthopedics;  Laterality: Right;   ORIF ANKLE DISLOCATION Right 15 yrs ago   ROBOTIC ASSITED PARTIAL NEPHRECTOMY Left 10/17/2015   Procedure: XI ROBOTIC ASSITED LEFT PARTIAL NEPHRECTOMY, Left Cyst Decortication, Left Renal Ultrasound;  Surgeon: Ricardo Likens, MD;  Location: [facility];  Service: Urology;  Laterality: Left;   UPPER GASTROINTESTINAL ENDOSCOPY      Current Outpatient Medications  Medication Sig Dispense Refill Last Dose/Taking   ARTIFICIAL TEAR OINTMENT OP Place 1 application into both eyes daily as needed (For dry eyes.).       aspirin  EC 81 MG tablet Take 81 mg by mouth daily.       b complex vitamins capsule Take 1 capsule by mouth daily.       dorzolamide  (TRUSOPT ) 2 % ophthalmic solution Place 1 drop into both eyes 2 (two) times daily. (Patient not taking: Reported on 04/14/2023)      dorzolamide -timolol (COSOPT) 22.3-6.8 MG/ML ophthalmic solution Place 1 drop into both eyes 2 (two) times daily.       finasteride  (PROSCAR ) 5 MG tablet Take 1 tablet (5 mg total) by mouth every evening. 90 tablet 3    hydrocortisone (ANUSOL-HC) 25 MG suppository Place 25 mg rectally daily as needed for hemorrhoids or anal itching.       lanolin ointment Apply topically as needed (apply to eye).       lisinopril  (PRINIVIL ,ZESTRIL ) 20 MG tablet Take 20 mg by mouth every morning.       Multiple Vitamins-Minerals (MULTIVITAMINS THER. W/MINERALS) TABS tablet Take 1 tablet by mouth daily.       nitroGLYCERIN  (NITROSTAT ) 0.4 MG SL tablet Place 1 tablet (0.4 mg total) under the tongue every 5 (five) minutes as needed for chest pain. (Patient not taking: Reported on 05/06/2023) 25 tablet 3    Omega-3 Fatty Acids (FISH OIL PO) Take 4 capsules by mouth daily.       omeprazole (PRILOSEC) 20 MG capsule Take 20 mg by mouth daily.        rosuvastatin  (CRESTOR ) 10 MG tablet Take 1 tablet (10 mg total) by mouth daily. Please schedule yearly appointment for future refills. Thank you 30 tablet 0    travoprost , benzalkonium, (TRAVATAN ) 0.004 % ophthalmic solution Place 1 drop into both eyes at bedtime.       No current facility-administered medications for this visit.   No Known Allergies  Social History   Tobacco Use   Smoking status: Former    Current packs/day: 0.00    Average packs/day: 1 pack/day for 20.0 years (20.0 ttl pk-yrs)    Types: Cigarettes    Start date: 01/19/1961    Quit date: 01/19/1981    Years since quitting: 42.7   Smokeless tobacco: Never  Substance Use Topics   Alcohol use: No    Family History  Problem Relation Age of Onset   Hypertension Father    Colon cancer Neg Hx    Esophageal cancer Neg Hx    Rectal cancer Neg Hx    Stomach cancer Neg Hx      Review of Systems  Musculoskeletal:  Positive for arthralgias.  All other systems reviewed and are negative.   Objective:  Physical Exam Constitutional:      General: He is not in acute distress.    Appearance: Normal appearance. He is normal weight.  HENT:     Head: Normocephalic and atraumatic.  Eyes:     Extraocular Movements: Extraocular movements intact.     Conjunctiva/sclera: Conjunctivae normal.     Pupils: Pupils are equal, round, and reactive to light.  Cardiovascular:     Rate and Rhythm: Normal rate and regular rhythm.     Pulses: Normal pulses.     Heart sounds: Normal heart sounds.  Pulmonary:     Effort: Pulmonary effort is normal. No respiratory distress.     Breath sounds: Normal breath sounds.  Abdominal:     General: Bowel sounds are normal. There is no distension.     Palpations: Abdomen is soft.     Tenderness: There is no abdominal tenderness.  Musculoskeletal:        General: Tenderness present.     Cervical back: Normal range of motion and neck supple.     Comments: TTP over medial and lateral joint  line, medial worse than lateral.  No calf tenderness, swelling, or erythema.  No overlying lesions of area of chief complaint.  Decreased strength and ROM due to elicited pain.  Pre-operative ROM 5-100.  Dorsiflexion and plantarflexion intact.  Stable to varus and valgus stress.  1+ DP of BLE, otherwise BLE appear grossly neurovascularly intact.  Gait mildly antalgic.   Lymphadenopathy:     Cervical: No cervical adenopathy.  Skin:    General: Skin is warm and dry.     Capillary Refill: Capillary refill takes less than 2 seconds.     Findings: No erythema or rash.  Neurological:     General: No focal deficit present.     Mental Status: He is alert and oriented to person, place, and time.  Psychiatric:        Mood and Affect: Mood normal.        Behavior: Behavior normal.     Vital signs in last 24 hours: @VSRANGES @  Labs:   Estimated body mass index is 24.14 kg/m as calculated from the following:   Height as of 05/06/23: 6' 2 (1.88 m).   Weight as of 05/06/23: 85.3 kg.   Imaging Review Plain radiographs demonstrate severe degenerative joint disease of the right knee(s). The overall alignment isnotable valgus deformity. The bone quality appears to be fair for age and reported activity level.      Assessment/Plan:  End stage arthritis, right knee   The patient history, physical examination, clinical judgment of the provider and imaging studies are consistent with end stage degenerative joint disease of the right knee(s) and total knee arthroplasty is deemed medically necessary. The treatment options including medical management, injection therapy arthroscopy and arthroplasty were discussed at length. The risks and benefits of total knee arthroplasty were presented and reviewed. The risks due to aseptic loosening, infection, stiffness, patella tracking problems, thromboembolic complications and other imponderables were discussed. The patient acknowledged the explanation, agreed to  proceed with the plan and consent was signed. Patient is being admitted for inpatient treatment for surgery, pain control, PT, OT, prophylactic antibiotics, VTE prophylaxis, progressive ambulation and ADL's and discharge planning. The patient is planning to be discharged home with HHPT ([facility]).    Anticipated LOS equal to or greater than 2 midnights due to - Age 48 and  older with one or more of the following:  - Obesity  - Expected need for hospital services (PT, OT, Nursing) required for safe  discharge  - Anticipated need for postoperative skilled nursing care or inpatient rehab  - Active co-morbidities: hx of cancer 2017 (renal cell carcinoma), HTN, HLD, CKD stage III, chronic renal failure, BPH, iron deficiency anemia, hearing impairment, GERD, glaucoma OR   - Unanticipated findings during/Post Surgery: None  - Patient is a high risk of re-admission due to: None

## 2023-09-23 NOTE — H&P (View-Only) (Signed)
 TOTAL KNEE ADMISSION H&P  Patient is being admitted for right total knee arthroplasty.  Subjective:  Chief Complaint:right knee pain.  HPI: Mike Roach, 83 y.o. male, has a history of pain and functional disability in the right knee due to arthritis and has failed non-surgical conservative treatments for greater than 12 weeks to includeNSAID's and/or analgesics, corticosteriod injections, viscosupplementation injections, use of assistive devices, and activity modification.  Onset of symptoms was gradual, starting >10 years ago with gradually worsening course since that time. The patient noted prior procedures on the knee to include  arthroscopy on the right knee(s).  Patient currently rates pain in the right knee(s) at 8 out of 10 with activity. Patient has night pain, worsening of pain with activity and weight bearing, pain that interferes with activities of daily living, and pain with passive range of motion.  Patient has evidence of periarticular osteophytes and joint space narrowing by imaging studies. There is no active infection.  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 12/25/2020   Pseudophakia of both eyes 11/12/2020   Pericardial effusion 01/05/2019   Chest discomfort 11/05/2018   Chronic right shoulder pain 02/03/2018   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   Biceps tendon rupture, proximal, left, initial encounter 06/01/2017   Hyperlipidemia 03/12/2017   GIST (gastrointestinal stroma tumor), malignant, colon (HCC) 03/12/2017   Deficiency anemia 10/17/2016   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/17/2015 10/19/2015   Hypertension    GERD (gastroesophageal reflux disease)    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, 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 10/17/2015  07/12/2015   Kidney lesion, native, left 07/12/2015   Iron deficiency anemia 03/15/2015   Bilateral hearing loss 03/15/2015   Skin lesion of left leg 03/07/2015   Anemia in chronic illness 03/07/2015   Chronic kidney disease, stage III (moderate) (HCC) 03/07/2015   Chronic kidney disease (CKD) 03/07/2015   Adenomatous colon polyp 09/21/2014   Lateral meniscus derangement 01/12/2013   Osteoarthritis of right knee 01/12/2013   Past Medical History:  Diagnosis Date   Adenomatous colon polyp 09/21/2014   Anemia in chronic illness 03/07/2015   being evaluated   Arthritis    Atrial flutter (HCC)    Bilateral hearing loss 03/15/2015   Cancer of intestine (HCC)    Cataract    Chronic kidney disease (CKD) 03/07/2015   Chronic kidney disease, stage III (moderate) (HCC) 03/07/2015   Chronic right shoulder pain 02/03/2018   GERD (gastroesophageal reflux disease)    GIST (gastrointestinal stroma tumor), malignant, colon (HCC) dx'd 2017   Duodenom,    Glaucoma    both eyes   Hearing impairment    Hypercholesterolemia    Hypertension    Iron deficiency anemia 03/15/2015   Kidney lesion, native, left 07/12/2015   Lateral meniscus derangement 01/12/2013   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   Osteoarthritis of right knee 01/12/2013   Prostate enlargement    Renal cancer, left (HCC) dx'd 2017   Skin lesion of left leg 03/07/2015    Past Surgical History:  Procedure Laterality Date   colonoscopy     COLONOSCOPY     ESOPHAGOGASTRODUODENOSCOPY (EGD) WITH PROPOFOL  N/A 06/08/2017   Procedure: ESOPHAGOGASTRODUODENOSCOPY (EGD) WITH PROPOFOL ;  Surgeon: Avram Lupita BRAVO,  MD;  Location: [facility];  Service: Endoscopy;  Laterality: N/A;   EUS N/A 07/05/2015   Procedure: UPPER ENDOSCOPIC ULTRASOUND (EUS) LINEAR;  Surgeon: Toribio SHAUNNA Cedar, MD;  Location: [facility];  Service: Endoscopy;  Laterality: N/A;    KNEE ARTHROSCOPY WITH LATERAL MENISECTOMY Right 01/21/2013   Procedure: KNEE ARTHROSCOPY WITH PARTIAL LATERAL MENISECTOMY;  Surgeon: Taft FORBES Minerva, MD;  Location: [facility];  Service: Orthopedics;  Laterality: Right;   ORIF ANKLE DISLOCATION Right 15 yrs ago   ROBOTIC ASSITED PARTIAL NEPHRECTOMY Left 10/17/2015   Procedure: XI ROBOTIC ASSITED LEFT PARTIAL NEPHRECTOMY, Left Cyst Decortication, Left Renal Ultrasound;  Surgeon: Ricardo Likens, MD;  Location: [facility];  Service: Urology;  Laterality: Left;   UPPER GASTROINTESTINAL ENDOSCOPY      Current Outpatient Medications  Medication Sig Dispense Refill Last Dose/Taking   ARTIFICIAL TEAR OINTMENT OP Place 1 application into both eyes daily as needed (For dry eyes.).       aspirin  EC 81 MG tablet Take 81 mg by mouth daily.       b complex vitamins capsule Take 1 capsule by mouth daily.       dorzolamide  (TRUSOPT ) 2 % ophthalmic solution Place 1 drop into both eyes 2 (two) times daily. (Patient not taking: Reported on 04/14/2023)      dorzolamide -timolol (COSOPT) 22.3-6.8 MG/ML ophthalmic solution Place 1 drop into both eyes 2 (two) times daily.       finasteride  (PROSCAR ) 5 MG tablet Take 1 tablet (5 mg total) by mouth every evening. 90 tablet 3    hydrocortisone (ANUSOL-HC) 25 MG suppository Place 25 mg rectally daily as needed for hemorrhoids or anal itching.       lanolin ointment Apply topically as needed (apply to eye).       lisinopril  (PRINIVIL ,ZESTRIL ) 20 MG tablet Take 20 mg by mouth every morning.       Multiple Vitamins-Minerals (MULTIVITAMINS THER. W/MINERALS) TABS tablet Take 1 tablet by mouth daily.       nitroGLYCERIN  (NITROSTAT ) 0.4 MG SL tablet Place 1 tablet (0.4 mg total) under the tongue every 5 (five) minutes as needed for chest pain. (Patient not taking: Reported on 05/06/2023) 25 tablet 3    Omega-3 Fatty Acids (FISH OIL PO) Take 4 capsules by mouth daily.       omeprazole (PRILOSEC) 20 MG capsule Take 20 mg by mouth daily.        rosuvastatin  (CRESTOR ) 10 MG tablet Take 1 tablet (10 mg total) by mouth daily. Please schedule yearly appointment for future refills. Thank you 30 tablet 0    travoprost , benzalkonium, (TRAVATAN ) 0.004 % ophthalmic solution Place 1 drop into both eyes at bedtime.       No current facility-administered medications for this visit.   No Known Allergies  Social History   Tobacco Use   Smoking status: Former    Current packs/day: 0.00    Average packs/day: 1 pack/day for 20.0 years (20.0 ttl pk-yrs)    Types: Cigarettes    Start date: 01/19/1961    Quit date: 01/19/1981    Years since quitting: 42.7   Smokeless tobacco: Never  Substance Use Topics   Alcohol use: No    Family History  Problem Relation Age of Onset   Hypertension Father    Colon cancer Neg Hx    Esophageal cancer Neg Hx    Rectal cancer Neg Hx    Stomach cancer Neg Hx      Review of Systems  Musculoskeletal:  Positive for arthralgias.  All other systems reviewed and are negative.   Objective:  Physical Exam Constitutional:      General: He is not in acute distress.    Appearance: Normal appearance. He is normal weight.  HENT:     Head: Normocephalic and atraumatic.  Eyes:     Extraocular Movements: Extraocular movements intact.     Conjunctiva/sclera: Conjunctivae normal.     Pupils: Pupils are equal, round, and reactive to light.  Cardiovascular:     Rate and Rhythm: Normal rate and regular rhythm.     Pulses: Normal pulses.     Heart sounds: Normal heart sounds.  Pulmonary:     Effort: Pulmonary effort is normal. No respiratory distress.     Breath sounds: Normal breath sounds.  Abdominal:     General: Bowel sounds are normal. There is no distension.     Palpations: Abdomen is soft.     Tenderness: There is no abdominal tenderness.  Musculoskeletal:        General: Tenderness present.     Cervical back: Normal range of motion and neck supple.     Comments: TTP over medial and lateral joint  line, medial worse than lateral.  No calf tenderness, swelling, or erythema.  No overlying lesions of area of chief complaint.  Decreased strength and ROM due to elicited pain.  Pre-operative ROM 5-100.  Dorsiflexion and plantarflexion intact.  Stable to varus and valgus stress.  1+ DP of BLE, otherwise BLE appear grossly neurovascularly intact.  Gait mildly antalgic.   Lymphadenopathy:     Cervical: No cervical adenopathy.  Skin:    General: Skin is warm and dry.     Capillary Refill: Capillary refill takes less than 2 seconds.     Findings: No erythema or rash.  Neurological:     General: No focal deficit present.     Mental Status: He is alert and oriented to person, place, and time.  Psychiatric:        Mood and Affect: Mood normal.        Behavior: Behavior normal.     Vital signs in last 24 hours: @VSRANGES @  Labs:   Estimated body mass index is 24.14 kg/m as calculated from the following:   Height as of 05/06/23: 6' 2 (1.88 m).   Weight as of 05/06/23: 85.3 kg.   Imaging Review Plain radiographs demonstrate severe degenerative joint disease of the right knee(s). The overall alignment isnotable valgus deformity. The bone quality appears to be fair for age and reported activity level.      Assessment/Plan:  End stage arthritis, right knee   The patient history, physical examination, clinical judgment of the provider and imaging studies are consistent with end stage degenerative joint disease of the right knee(s) and total knee arthroplasty is deemed medically necessary. The treatment options including medical management, injection therapy arthroscopy and arthroplasty were discussed at length. The risks and benefits of total knee arthroplasty were presented and reviewed. The risks due to aseptic loosening, infection, stiffness, patella tracking problems, thromboembolic complications and other imponderables were discussed. The patient acknowledged the explanation, agreed to  proceed with the plan and consent was signed. Patient is being admitted for inpatient treatment for surgery, pain control, PT, OT, prophylactic antibiotics, VTE prophylaxis, progressive ambulation and ADL's and discharge planning. The patient is planning to be discharged home with HHPT ([facility]).    Anticipated LOS equal to or greater than 2 midnights due to - Age 48 and  older with one or more of the following:  - Obesity  - Expected need for hospital services (PT, OT, Nursing) required for safe  discharge  - Anticipated need for postoperative skilled nursing care or inpatient rehab  - Active co-morbidities: hx of cancer 2017 (renal cell carcinoma), HTN, HLD, CKD stage III, chronic renal failure, BPH, iron deficiency anemia, hearing impairment, GERD, glaucoma OR   - Unanticipated findings during/Post Surgery: None  - Patient is a high risk of re-admission due to: None

## 2023-09-29 NOTE — Patient Instructions (Signed)
 SURGICAL WAITING ROOM VISITATION Patients having surgery or a procedure may have no more than 2 support people in the waiting area - these visitors may rotate in the visitor waiting room.   Due to an increase in RSV and influenza rates and associated hospitalizations, children ages 36 and under may not visit patients in [facility] hospitals. If the patient needs to stay at the hospital during part of their recovery, the visitor guidelines for inpatient rooms apply.  PRE-OP VISITATION  Pre-op nurse will coordinate an appropriate time for 1 support person to accompany the patient in pre-op.  This support person may not rotate.  This visitor will be contacted when the time is appropriate for the visitor to come back in the pre-op area.  Please refer to the [facility] website for the visitor guidelines for Inpatients (after your surgery is over and you are in a regular room).  You are not required to quarantine at this time prior to your surgery. However, you must do this: Hand Hygiene often Do NOT share personal items Notify your provider if you are in close contact with someone who has COVID or you develop fever 100.4 or greater, new onset of sneezing, cough, sore throat, shortness of breath or body aches.  If you test positive for Covid or have been in contact with anyone that has tested positive in the last 10 days please notify you surgeon.    Your procedure is scheduled on: 10/12/23   Report to [facility] Main Entrance: [place] entrance where the [employer] is available.   Report to admitting at: 10:20 AM  Call this number if you have any questions or problems the morning of surgery [phone]  FOLLOW ANY ADDITIONAL PRE OP INSTRUCTIONS YOU RECEIVED FROM YOUR SURGEON'S OFFICE!!!  Do not eat food after Midnight the night prior to your surgery/procedure.  After Midnight you may have the following liquids until: 9:50 AM DAY OF SURGERY  Clear Liquid Diet Water Black  Coffee (sugar ok, NO MILK/CREAM OR CREAMERS)  Tea (sugar ok, NO MILK/CREAM OR CREAMERS) regular and decaf                             Plain Jell-O  with no fruit (NO RED)                                           Fruit ices (not with fruit pulp, NO RED)                                     Popsicles (NO RED)                                                                  Juice: NO CITRUS JUICES: only apple, WHITE grape, WHITE cranberry Sports drinks like Gatorade or Powerade (NO RED)   The day of surgery:  Drink ONE (1) Pre-Surgery Clear Ensure at : 9:50 AM the morning of surgery. Drink in one sitting. Do not sip.  This drink was given  to you during your hospital pre-op appointment visit. Nothing else to drink after completing the Pre-Surgery Clear Ensure or G2 : No candy, chewing gum or throat lozenges.     Oral Hygiene is also important to reduce your risk of infection.        Remember - BRUSH YOUR TEETH THE MORNING OF SURGERY WITH YOUR REGULAR TOOTHPASTE  Do NOT smoke after Midnight the night before surgery.  STOP TAKING all Vitamins, Herbs and supplements 1 week before your surgery.   Take ONLY these medicines the morning of surgery with A SIP OF WATER: omeprazole.Use eye drops as usual.  If You have been diagnosed with Sleep Apnea - Bring CPAP mask and tubing day of surgery. We will provide you with a CPAP machine on the day of your surgery.                   You may not have any metal on your body including hair pins, jewelry, and body piercing  Do not wear lotions, powders, perfumes / cologne, or deodorant  Men may shave face and neck.  Contacts, Hearing Aids, dentures or bridgework may not be worn into surgery. DENTURES WILL BE REMOVED PRIOR TO SURGERY PLEASE DO NOT APPLY Poly grip OR ADHESIVES!!!  You may bring a small overnight bag with you on the day of surgery, only pack items that are not valuable. [place] IS NOT RESPONSIBLE   FOR VALUABLES THAT ARE LOST OR STOLEN.    Patients discharged on the day of surgery will not be allowed to drive home.  Someone NEEDS to stay with you for the first 24 hours after anesthesia.  Do not bring your home medications to the hospital. The Pharmacy will dispense medications listed on your medication list to you during your admission in the Hospital.  Special Instructions: Bring a copy of your healthcare power of attorney and living will documents the day of surgery, if you wish to have them scanned into your [facility] Records- EPIC  Please read over the following fact sheets you were given: IF YOU HAVE QUESTIONS ABOUT YOUR PRE-OP INSTRUCTIONS, PLEASE CALL [phone]  PATIENT SIGNATURE_________________________________  NURSE SIGNATURE__________________________________  ________________________________________________________________________    Pre-operative 5 CHG Bath Instructions   You can play a key role in reducing the risk of infection after surgery. Your skin needs to be as free of germs as possible. You can reduce the number of germs on your skin by washing with CHG (chlorhexidine  gluconate) soap before surgery. CHG is an antiseptic soap that kills germs and continues to kill germs even after washing.   DO NOT use if you have an allergy to chlorhexidine /CHG or antibacterial soaps. If your skin becomes reddened or irritated, stop using the CHG and notify one of our RNs at [phone].   Please shower with the CHG soap starting 4 days before surgery using the following schedule:     Please keep in mind the following:  DO NOT shave, including legs and underarms, starting the day of your first shower.   You may shave your face at any point before/day of surgery.  Place clean sheets on your bed the day you start using CHG soap. Use a clean washcloth (not used since being washed) for each shower. DO NOT sleep with pets once you start using the CHG.   CHG Shower Instructions:  If you choose to wash  your hair and private area, wash first with your normal shampoo/soap.  After you use shampoo/soap,  rinse your hair and body thoroughly to remove shampoo/soap residue.  Turn the water OFF and apply about 3 tablespoons (45 ml) of CHG soap to a CLEAN washcloth.  Apply CHG soap ONLY FROM YOUR NECK DOWN TO YOUR TOES (washing for 3-5 minutes)  DO NOT use CHG soap on face, private areas, open wounds, or sores.  Pay special attention to the area where your surgery is being performed.  If you are having back surgery, having someone wash your back for you may be helpful. Wait 2 minutes after CHG soap is applied, then you may rinse off the CHG soap.  Pat dry with a clean towel  Put on clean clothes/pajamas   If you choose to wear lotion, please use ONLY the CHG-compatible lotions on the back of this paper.     Additional instructions for the day of surgery: DO NOT APPLY any lotions, deodorants, cologne, or perfumes.   Put on clean/comfortable clothes.  Brush your teeth.  Ask your nurse before applying any prescription medications to the skin.   CHG Compatible Lotions   Aveeno Moisturizing lotion  Cetaphil Moisturizing Cream  Cetaphil Moisturizing Lotion  Clairol Herbal Essence Moisturizing Lotion, Dry Skin  Clairol Herbal Essence Moisturizing Lotion, Extra Dry Skin  Clairol Herbal Essence Moisturizing Lotion, Normal Skin  Curel Age Defying Therapeutic Moisturizing Lotion with Alpha Hydroxy  Curel Extreme Care Body Lotion  Curel Soothing Hands Moisturizing Hand Lotion  Curel Therapeutic Moisturizing Cream, Fragrance-Free  Curel Therapeutic Moisturizing Lotion, Fragrance-Free  Curel Therapeutic Moisturizing Lotion, Original Formula  Eucerin Daily Replenishing Lotion  Eucerin Dry Skin Therapy Plus Alpha Hydroxy Crme  Eucerin Dry Skin Therapy Plus Alpha Hydroxy Lotion  Eucerin Original Crme  Eucerin Original Lotion  Eucerin Plus Crme Eucerin Plus Lotion  Eucerin TriLipid Replenishing  Lotion  Keri Anti-Bacterial Hand Lotion  Keri Deep Conditioning Original Lotion Dry Skin Formula Softly Scented  Keri Deep Conditioning Original Lotion, Fragrance Free Sensitive Skin Formula  Keri Lotion Fast Absorbing Fragrance Free Sensitive Skin Formula  Keri Lotion Fast Absorbing Softly Scented Dry Skin Formula  Keri Original Lotion  Keri Skin Renewal Lotion Keri Silky Smooth Lotion  Keri Silky Smooth Sensitive Skin Lotion  Nivea Body Creamy Conditioning Oil  Nivea Body Extra Enriched Lotion  Nivea Body Original Lotion  Nivea Body Sheer Moisturizing Lotion Nivea Crme  Nivea Skin Firming Lotion  NutraDerm 30 Skin Lotion  NutraDerm Skin Lotion  NutraDerm Therapeutic Skin Cream  NutraDerm Therapeutic Skin Lotion  ProShield Protective Hand Cream  Provon moisturizing lotion   Incentive Spirometer  An incentive spirometer is a tool that can help keep your lungs clear and active. This tool measures how well you are filling your lungs with each breath. Taking long deep breaths may help reverse or decrease the chance of developing breathing (pulmonary) problems (especially infection) following: A long period of time when you are unable to move or be active. BEFORE THE PROCEDURE  If the spirometer includes an indicator to show your best effort, your nurse or respiratory therapist will set it to a desired goal. If possible, sit up straight or lean slightly forward. Try not to slouch. Hold the incentive spirometer in an upright position. INSTRUCTIONS FOR USE  Sit on the edge of your bed if possible, or sit up as far as you can in bed or on a chair. Hold the incentive spirometer in an upright position. Breathe out normally. Place the mouthpiece in your mouth and seal your lips tightly around it. Breathe in  slowly and as deeply as possible, raising the piston or the ball toward the top of the column. Hold your breath for 3-5 seconds or for as long as possible. Allow the piston or ball to  fall to the bottom of the column. Remove the mouthpiece from your mouth and breathe out normally. Rest for a few seconds and repeat Steps 1 through 7 at least 10 times every 1-2 hours when you are awake. Take your time and take a few normal breaths between deep breaths. The spirometer may include an indicator to show your best effort. Use the indicator as a goal to work toward during each repetition. After each set of 10 deep breaths, practice coughing to be sure your lungs are clear. If you have an incision (the cut made at the time of surgery), support your incision when coughing by placing a pillow or rolled up towels firmly against it. Once you are able to get out of bed, walk around indoors and cough well. You may stop using the incentive spirometer when instructed by your caregiver.  RISKS AND COMPLICATIONS Take your time so you do not get dizzy or light-headed. If you are in pain, you may need to take or ask for pain medication before doing incentive spirometry. It is harder to take a deep breath if you are having pain. AFTER USE Rest and breathe slowly and easily. It can be helpful to keep track of a log of your progress. Your caregiver can provide you with a simple table to help with this. If you are using the spirometer at home, follow these instructions: SEEK MEDICAL CARE IF:  You are having difficultly using the spirometer. You have trouble using the spirometer as often as instructed. Your pain medication is not giving enough relief while using the spirometer. You develop fever of 100.5 F (38.1 C) or higher. SEEK IMMEDIATE MEDICAL CARE IF:  You cough up bloody sputum that had not been present before. You develop fever of 102 F (38.9 C) or greater. You develop worsening pain at or near the incision site. MAKE SURE YOU:  Understand these instructions. Will watch your condition. Will get help right away if you are not doing well or get worse. Document Released: 06/30/2006  Document Revised: 05/12/2011 Document Reviewed: 08/31/2006 [facility] Patient Information 2014 [place], [place].   ________________________________________________________________________

## 2023-10-02 ENCOUNTER — Encounter (HOSPITAL_COMMUNITY): Payer: Self-pay

## 2023-10-02 ENCOUNTER — Encounter (HOSPITAL_COMMUNITY)
Admission: RE | Admit: 2023-10-02 | Discharge: 2023-10-02 | Disposition: A | Discharge status: Home / Self Care | Source: Ambulatory Visit | Attending: Orthopedic Surgery | Admitting: Orthopedic Surgery

## 2023-10-02 ENCOUNTER — Other Ambulatory Visit: Payer: Self-pay

## 2023-10-02 VITALS — BP 132/69 | HR 59 | Temp 98.0°F | Ht 74.0 in | Wt 185.0 lb

## 2023-10-02 DIAGNOSIS — I2510 Atherosclerotic heart disease of native coronary artery without angina pectoris: Secondary | ICD-10-CM | POA: Insufficient documentation

## 2023-10-02 DIAGNOSIS — N183 Chronic kidney disease, stage 3 unspecified: Secondary | ICD-10-CM | POA: Insufficient documentation

## 2023-10-02 DIAGNOSIS — Z85528 Personal history of other malignant neoplasm of kidney: Secondary | ICD-10-CM | POA: Insufficient documentation

## 2023-10-02 DIAGNOSIS — Z87891 Personal history of nicotine dependence: Secondary | ICD-10-CM | POA: Insufficient documentation

## 2023-10-02 DIAGNOSIS — I129 Hypertensive chronic kidney disease with stage 1 through stage 4 chronic kidney disease, or unspecified chronic kidney disease: Secondary | ICD-10-CM | POA: Insufficient documentation

## 2023-10-02 DIAGNOSIS — Z01812 Encounter for preprocedural laboratory examination: Secondary | ICD-10-CM | POA: Insufficient documentation

## 2023-10-02 DIAGNOSIS — M1711 Unilateral primary osteoarthritis, right knee: Secondary | ICD-10-CM | POA: Insufficient documentation

## 2023-10-02 DIAGNOSIS — Z85038 Personal history of other malignant neoplasm of large intestine: Secondary | ICD-10-CM | POA: Diagnosis not present

## 2023-10-02 DIAGNOSIS — Z7982 Long term (current) use of aspirin: Secondary | ICD-10-CM | POA: Diagnosis not present

## 2023-10-02 DIAGNOSIS — G8929 Other chronic pain: Secondary | ICD-10-CM | POA: Insufficient documentation

## 2023-10-02 DIAGNOSIS — Z01818 Encounter for other preprocedural examination: Secondary | ICD-10-CM

## 2023-10-02 HISTORY — DX: Cardiac arrhythmia, unspecified: I49.9

## 2023-10-02 HISTORY — DX: Atherosclerotic heart disease of native coronary artery without angina pectoris: I25.10

## 2023-10-02 HISTORY — DX: Other pericardial effusion (noninflammatory): I31.39

## 2023-10-02 LAB — COMPREHENSIVE METABOLIC PANEL WITH GFR
ALT: 17 U/L (ref 0–44)
AST: 22 U/L (ref 15–41)
Albumin: 3.7 g/dL (ref 3.5–5.0)
Alkaline Phosphatase: 63 U/L (ref 38–126)
Anion gap: 7 (ref 5–15)
BUN: 31 mg/dL — ABNORMAL HIGH (ref 8–23)
CO2: 22 mmol/L (ref 22–32)
Calcium: 9.3 mg/dL (ref 8.9–10.3)
Chloride: 109 mmol/L (ref 98–111)
Creatinine, Ser: 1.61 mg/dL — ABNORMAL HIGH (ref 0.61–1.24)
GFR, Estimated: 42 mL/min — ABNORMAL LOW (ref >60)
Glucose, Bld: 100 mg/dL — ABNORMAL HIGH (ref 70–99)
Potassium: 4.5 mmol/L (ref 3.5–5.1)
Sodium: 138 mmol/L (ref 135–145)
Total Bilirubin: 0.9 mg/dL (ref 0.0–1.2)
Total Protein: 7.2 g/dL (ref 6.5–8.1)

## 2023-10-02 LAB — CBC WITH DIFFERENTIAL/PLATELET
Abs Immature Granulocytes: 0.01 K/uL (ref 0.00–0.07)
Basophils Absolute: 0.1 K/uL (ref 0.0–0.1)
Basophils Relative: 1 %
Eosinophils Absolute: 0.2 K/uL (ref 0.0–0.5)
Eosinophils Relative: 4 %
HCT: 31.8 % — ABNORMAL LOW (ref 39.0–52.0)
Hemoglobin: 10.3 g/dL — ABNORMAL LOW (ref 13.0–17.0)
Immature Granulocytes: 0 %
Lymphocytes Relative: 21 %
Lymphs Abs: 1 K/uL (ref 0.7–4.0)
MCH: 32.9 pg (ref 26.0–34.0)
MCHC: 32.4 g/dL (ref 30.0–36.0)
MCV: 101.6 fL — ABNORMAL HIGH (ref 80.0–100.0)
Monocytes Absolute: 0.5 K/uL (ref 0.1–1.0)
Monocytes Relative: 11 %
Neutro Abs: 3.1 K/uL (ref 1.7–7.7)
Neutrophils Relative %: 63 %
Platelets: 218 K/uL (ref 150–400)
RBC: 3.13 MIL/uL — ABNORMAL LOW (ref 4.22–5.81)
RDW: 14.8 % (ref 11.5–15.5)
WBC: 5 K/uL (ref 4.0–10.5)
nRBC: 0 % (ref 0.0–0.2)

## 2023-10-02 LAB — SURGICAL PCR SCREEN
MRSA, PCR: NEGATIVE
Staphylococcus aureus: NEGATIVE

## 2023-10-02 NOTE — Progress Notes (Signed)
 For Anesthesia: PCP - Mike Roach LABOR, DO  Cardiologist - Nahser, Mike PARAS, MD . Mike Roach: 03/16/23 Clearance:Mike Griffiths, NP : 08/31/23 Bowel Prep reminder:  Chest x-ray - CT coronary: 03/09/19 EKG - 03/16/23 Stress Test -  ECHO - 05/03/19 Cardiac Cath -  Pacemaker/ICD device last checked: Pacemaker orders received: Device Rep notified:  Spinal Cord Stimulator:N/A  Sleep Study - N/A CPAP -   Fasting Blood Sugar - N/A Checks Blood Sugar _____ times a day Date and result of last Hgb A1c-  Last dose of GLP1 agonist- N/A GLP1 instructions:   Last dose of SGLT-2 inhibitors- N/A SGLT-2 instructions:   Blood Thinner Instructions: Aspirin  Instructions: On hold. Last Dose:  Activity level: Can go up a flight of stairs and activities of daily living without stopping and without chest pain and/or shortness of breath   Able to exercise without chest pain and/or shortness of breath  Anesthesia review: Hx: HTN,Aflutter,CKD 3, CAD,Pericardial effusion.  Patient denies shortness of breath, fever, cough and chest pain at PAT appointment   Patient verbalized understanding of instructions that were reviewed over the telephone.

## 2023-10-05 NOTE — Progress Notes (Signed)
 Anesthesia Chart Review   Case: [id number] Date/Time: 10/12/23 1241   Procedure: ARTHROPLASTY, KNEE, TOTAL (Right: Knee)   Anesthesia type: Spinal   Pre-op diagnosis: OA RIGHT KNEE   Location: [facility] ROOM 08 / [facility]   Surgeons: Edna Toribio LABOR, MD       DISCUSSION:83 y.o. 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 Toribio Edna.   Per PCP note 08/31/2023, The patient is cleared for surgery, discussed the need for aspirin  to be held 10 days prior to surgery and restarted 24 to 48 hours after surgery. Discussed that per preoperative clearance paperwork the patient will have lab work in the hospital anesthesiology clearance 10 to 14 days prior to surgery.   Pt last seen by cardiology 03/16/23. Pt with mild-moderate CAD by coronary CTA 02/2019. H/o pericardial effusion in the past.  Asymptomatic at this recent cardio visit. Pt reports he can climb a flight of stairs without difficulty, denies chest pain or shortness of breath. Advised to follow up with cardio in 1 year.  VS: BP 132/69   Pulse (!) 59   Temp 36.7 C (Oral)   Ht 6' 2 (1.88 m)   Wt 83.9 kg   SpO2 100%   BMI 23.75 kg/m   PROVIDERS: Verdie Johann LABOR, DO is PCP   Cardiologist - Nahser, Aleene PARAS, MD  LABS: Labs reviewed: Acceptable for surgery. (all labs ordered are listed, but only abnormal results are displayed)  Labs Reviewed  CBC WITH DIFFERENTIAL/PLATELET - Abnormal; Notable for the following components:      Result Value   RBC 3.13 (*)    Hemoglobin 10.3 (*)    HCT 31.8 (*)    MCV 101.6 (*)    All other components within normal limits  COMPREHENSIVE METABOLIC PANEL WITH GFR - Abnormal; Notable for the following components:   Glucose, Bld 100 (*)    BUN 31 (*)    Creatinine, Ser 1.61  (*)    GFR, Estimated 42 (*)    All other components within normal limits  SURGICAL PCR SCREEN  TYPE AND SCREEN     IMAGES:   EKG:   CV: Echo 01/12/2019 1. Left ventricular ejection fraction, by estimation, is 60 to 65%. The  left ventricle has normal function. The left ventricle has no regional  wall motion abnormalities. Left ventricular diastolic parameters are  indeterminate.   2. Right ventricular systolic function is normal. The right ventricular  size is normal. There is normal pulmonary artery systolic pressure.   3. The mitral valve is normal in structure and function. Trace mitral  valve regurgitation.   4. The aortic valve is normal in structure and function. Aortic valve  regurgitation is not visualized.  Past Medical History:  Diagnosis Date   Adenomatous colon polyp 09/21/2014   Anemia in chronic illness 03/07/2015   being evaluated   Arthritis    Atrial flutter (HCC)    Bilateral hearing loss 03/15/2015   Cancer of intestine (HCC)    Cataract    Chronic kidney disease (CKD) 03/07/2015   Chronic kidney disease, stage III (moderate) (HCC) 03/07/2015   Chronic right shoulder pain 02/03/2018   Coronary artery disease    Dysrhythmia    GERD (gastroesophageal reflux disease)    GIST (gastrointestinal stroma tumor), malignant, colon (HCC) dx'd  2017   Duodenom,    Glaucoma    both eyes   Glaucoma    Hearing impairment    Hypercholesterolemia    Hypertension    Iron deficiency anemia 03/15/2015   Kidney lesion, native, left 07/12/2015   Lateral meniscus derangement 01/12/2013   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   Osteoarthritis of right knee 01/12/2013   Pericardial effusion    Prostate enlargement    Renal cancer, left (HCC) dx'd 2017   Skin lesion of left leg 03/07/2015    Past Surgical History:  Procedure Laterality Date   colonoscopy     COLONOSCOPY     ESOPHAGOGASTRODUODENOSCOPY (EGD) WITH PROPOFOL  N/A 06/08/2017   Procedure:  ESOPHAGOGASTRODUODENOSCOPY (EGD) WITH PROPOFOL ;  Surgeon: Avram Lupita BRAVO, MD;  Location: [facility];  Service: Endoscopy;  Laterality: N/A;   EUS N/A 07/05/2015   Procedure: UPPER ENDOSCOPIC ULTRASOUND (EUS) LINEAR;  Surgeon: Toribio SHAUNNA Cedar, MD;  Location[facility];  Service: Endoscopy;  Laterality: N/A;   EYE SURGERY     KNEE ARTHROSCOPY WITH LATERAL MENISECTOMY Right 01/21/2013   Procedure: KNEE ARTHROSCOPY WITH PARTIAL LATERAL MENISECTOMY;  Surgeon: Taft BRAVO Minerva, MD;  Location: [facility];  Service: Orthopedics;  Laterality: Right;   ORIF ANKLE DISLOCATION Right 15 yrs ago   ROBOTIC ASSITED PARTIAL NEPHRECTOMY Left 10/17/2015   Procedure: XI ROBOTIC ASSITED LEFT PARTIAL NEPHRECTOMY, Left Cyst Decortication, Left Renal Ultrasound;  Surgeon: Ricardo Likens, MD;  Location: [facility];  Service: Urology;  Laterality: Left;   UPPER GASTROINTESTINAL ENDOSCOPY      MEDICATIONS:  artificial tears ophthalmic solution   ascorbic acid (VITAMIN C) 500 MG tablet   aspirin  EC 81 MG tablet   Cholecalciferol (VITAMIN D3) 10 MCG (400 UNIT) tablet   finasteride  (PROSCAR ) 5 MG tablet   hydrocortisone (ANUSOL-HC) 2.5 % rectal cream   lisinopril  (PRINIVIL ,ZESTRIL ) 20 MG tablet   Multiple Vitamins-Minerals (MULTIVITAMINS THER. W/MINERALS) TABS tablet   nitroGLYCERIN  (NITROSTAT ) 0.4 MG SL tablet   Omega-3 Fatty Acids (FISH OIL) 1200 MG CAPS   omeprazole (PRILOSEC) 20 MG capsule   pyridOXINE (VITAMIN B6) 100 MG tablet   rosuvastatin  (CRESTOR ) 10 MG tablet   vitamin B-12 (CYANOCOBALAMIN) 500 MCG tablet   No current facility-administered medications for this encounter.    Harlene Hoots Ward, PA-C [facility] Pre-Surgical Testing [phone]

## 2023-10-07 ENCOUNTER — Encounter (HOSPITAL_COMMUNITY): Admission: RE | Admit: 2023-10-07 | Source: Ambulatory Visit

## 2023-10-12 LAB — TYPE AND SCREEN
ABO/RH(D): O POS
Antibody Screen: NEGATIVE

## 2023-10-13 NOTE — Progress Notes (Signed)
 Updated date of surgery: 10/19/23  Updated time of arrival: 10 AM  Patient denies any changes in allergies, medications, medical history since pre op appointment on:10/02/23  Pre op instructions reviewed, follow up questions addressed and patient verbalized understanding at this time.

## 2023-10-13 NOTE — Progress Notes (Signed)
 Attempted to obtain medical history via telephone, unable to reach at this time. HIPAA compliant voicemail message left requesting return call to pre surgical testing department.

## 2023-10-13 NOTE — Patient Instructions (Signed)
 SURGICAL WAITING ROOM VISITATION Patients having surgery or a procedure may have no more than 2 support people in the waiting area - these visitors may rotate in the visitor waiting room.   Due to an increase in RSV and influenza rates and associated hospitalizations, children ages 81 and under may not visit patients in [facility] hospitals. If the patient needs to stay at the hospital during part of their recovery, the visitor guidelines for inpatient rooms apply.   PRE-OP VISITATION  Pre-op nurse will coordinate an appropriate time for 1 support person to accompany the patient in pre-op.  This support person may not rotate.  This visitor will be contacted when the time is appropriate for the visitor to come back in the pre-op area.   Please refer to the [facility] website for the visitor guidelines for Inpatients (after your surgery is over and you are in a regular room).   You are not required to quarantine at this time prior to your surgery. However, you must do this: Hand Hygiene often Do NOT share personal items Notify your provider if you are in close contact with someone who has COVID or you develop fever 100.4 or greater, new onset of sneezing, cough, sore throat, shortness of breath or body aches.  If you test positive for Covid or have been in contact with anyone that has tested positive in the last 10 days please notify you surgeon.     Your procedure is scheduled on: 10/19/23    Report to [facility] Main Entrance: [place] entrance where the [employer] is available.    Report to admitting at: 10:00 AM   Call this number if you have any questions or problems the morning of surgery [phone]   FOLLOW ANY ADDITIONAL PRE OP INSTRUCTIONS YOU RECEIVED FROM YOUR SURGEON'S OFFICE!!!   Do not eat food after Midnight the night prior to your surgery/procedure.   After Midnight you may have the following liquids until: 9:30 AM DAY OF SURGERY   Clear Liquid  Diet Water Black Coffee (sugar ok, NO MILK/CREAM OR CREAMERS)  Tea (sugar ok, NO MILK/CREAM OR CREAMERS) regular and decaf                             Plain Jell-O  with no fruit (NO RED)                                           Fruit ices (not with fruit pulp, NO RED)                                     Popsicles (NO RED)                                                                  Juice: NO CITRUS JUICES: only apple, WHITE grape, WHITE cranberry Sports drinks like Gatorade or Powerade (NO RED)        The day of surgery:  Drink ONE (1) Pre-Surgery Clear Ensure at : 9:30 AM  the morning of surgery. Drink in one sitting. Do not sip.  This drink was given to you during your hospital pre-op appointment visit. Nothing else to drink after completing the Pre-Surgery Clear Ensure : No candy, chewing gum or throat lozenges.      Oral Hygiene is also important to reduce your risk of infection.        Remember - BRUSH YOUR TEETH THE MORNING OF SURGERY WITH YOUR REGULAR TOOTHPASTE   Do NOT smoke after Midnight the night before surgery.   STOP TAKING all Vitamins, Herbs and supplements 1 week before your surgery.    Take ONLY these medicines the morning of surgery with A SIP OF WATER: omeprazole.Use eye drops as usual.   If You have been diagnosed with Sleep Apnea - Bring CPAP mask and tubing day of surgery. We will provide you with a CPAP machine on the day of your surgery.                   You may not have any metal on your body including hair pins, jewelry, and body piercing   Do not wear lotions, powders, perfumes / cologne, or deodorant   Men may shave face and neck.   Contacts, Hearing Aids, dentures or bridgework may not be worn into surgery. DENTURES WILL BE REMOVED PRIOR TO SURGERY PLEASE DO NOT APPLY Poly grip OR ADHESIVES!!!   You may bring a small overnight bag with you on the day of surgery, only pack items that are not valuable. [place] IS NOT RESPONSIBLE   FOR  VALUABLES THAT ARE LOST OR STOLEN.    Patients discharged on the day of surgery will not be allowed to drive home.  Someone NEEDS to stay with you for the first 24 hours after anesthesia.   Do not bring your home medications to the hospital. The Pharmacy will dispense medications listed on your medication list to you during your admission in the Hospital.   Special Instructions: Bring a copy of your healthcare power of attorney and living will documents the day of surgery, if you wish to have them scanned into your [place] [facility] Records- EPIC   Please read over the following fact sheets you were given: IF YOU HAVE QUESTIONS ABOUT YOUR PRE-OP INSTRUCTIONS, PLEASE CALL [phone]   PATIENT SIGNATURE_________________________________   NURSE SIGNATURE__________________________________   ________________________________________________________________________     Pre-operative 5 CHG Bath Instructions    You can play a key role in reducing the risk of infection after surgery. Your skin needs to be as free of germs as possible. You can reduce the number of germs on your skin by washing with CHG (chlorhexidine  gluconate) soap before surgery. CHG is an antiseptic soap that kills germs and continues to kill germs even after washing.    DO NOT use if you have an allergy to chlorhexidine /CHG or antibacterial soaps. If your skin becomes reddened or irritated, stop using the CHG and notify one of our RNs at [phone].    Please shower with the CHG soap starting 4 days before surgery using the following schedule:       Please keep in mind the following:  DO NOT shave, including legs and underarms, starting the day of your first shower.   You may shave your face at any point before/day of surgery.  Place clean sheets on your bed the day you start using CHG soap. Use a clean washcloth (not used since being washed) for each shower. DO NOT sleep  with pets once you start using the CHG.    CHG  Shower Instructions:  If you choose to wash your hair and private area, wash first with your normal shampoo/soap.  After you use shampoo/soap, rinse your hair and body thoroughly to remove shampoo/soap residue.  Turn the water OFF and apply about 3 tablespoons (45 ml) of CHG soap to a CLEAN washcloth.  Apply CHG soap ONLY FROM YOUR NECK DOWN TO YOUR TOES (washing for 3-5 minutes)  DO NOT use CHG soap on face, private areas, open wounds, or sores.  Pay special attention to the area where your surgery is being performed.  If you are having back surgery, having someone wash your back for you may be helpful. Wait 2 minutes after CHG soap is applied, then you may rinse off the CHG soap.  Pat dry with a clean towel  Put on clean clothes/pajamas   If you choose to wear lotion, please use ONLY the CHG-compatible lotions on the back of this paper.     Additional instructions for the day of surgery: DO NOT APPLY any lotions, deodorants, cologne, or perfumes.   Put on clean/comfortable clothes.  Brush your teeth.  Ask your nurse before applying any prescription medications to the skin.    CHG Compatible Lotions    Aveeno Moisturizing lotion  Cetaphil Moisturizing Cream  Cetaphil Moisturizing Lotion  Clairol Herbal Essence Moisturizing Lotion, Dry Skin  Clairol Herbal Essence Moisturizing Lotion, Extra Dry Skin  Clairol Herbal Essence Moisturizing Lotion, Normal Skin  Curel Age Defying Therapeutic Moisturizing Lotion with Alpha Hydroxy  Curel Extreme Care Body Lotion  Curel Soothing Hands Moisturizing Hand Lotion  Curel Therapeutic Moisturizing Cream, Fragrance-Free  Curel Therapeutic Moisturizing Lotion, Fragrance-Free  Curel Therapeutic Moisturizing Lotion, Original Formula  Eucerin Daily Replenishing Lotion  Eucerin Dry Skin Therapy Plus Alpha Hydroxy Crme  Eucerin Dry Skin Therapy Plus Alpha Hydroxy Lotion  Eucerin Original Crme  Eucerin Original Lotion  Eucerin Plus Crme Eucerin  Plus Lotion  Eucerin TriLipid Replenishing Lotion  Keri Anti-Bacterial Hand Lotion  Keri Deep Conditioning Original Lotion Dry Skin Formula Softly Scented  Keri Deep Conditioning Original Lotion, Fragrance Free Sensitive Skin Formula  Keri Lotion Fast Absorbing Fragrance Free Sensitive Skin Formula  Keri Lotion Fast Absorbing Softly Scented Dry Skin Formula  Keri Original Lotion  Keri Skin Renewal Lotion Keri Silky Smooth Lotion  Keri Silky Smooth Sensitive Skin Lotion  Nivea Body Creamy Conditioning Oil  Nivea Body Extra Enriched Lotion  Nivea Body Original Lotion  Nivea Body Sheer Moisturizing Lotion Nivea Crme  Nivea Skin Firming Lotion  NutraDerm 30 Skin Lotion  NutraDerm Skin Lotion  NutraDerm Therapeutic Skin Cream  NutraDerm Therapeutic Skin Lotion  ProShield Protective Hand Cream  Provon moisturizing lotion    Incentive Spirometer   An incentive spirometer is a tool that can help keep your lungs clear and active. This tool measures how well you are filling your lungs with each breath. Taking long deep breaths may help reverse or decrease the chance of developing breathing (pulmonary) problems (especially infection) following: A long period of time when you are unable to move or be active. BEFORE THE PROCEDURE  If the spirometer includes an indicator to show your best effort, your nurse or respiratory therapist will set it to a desired goal. If possible, sit up straight or lean slightly forward. Try not to slouch. Hold the incentive spirometer in an upright position. INSTRUCTIONS FOR USE  Sit on the edge of your bed if  possible, or sit up as far as you can in bed or on a chair. Hold the incentive spirometer in an upright position. Breathe out normally. Place the mouthpiece in your mouth and seal your lips tightly around it. Breathe in slowly and as deeply as possible, raising the piston or the ball toward the top of the column. Hold your breath for 3-5 seconds or for as  long as possible. Allow the piston or ball to fall to the bottom of the column. Remove the mouthpiece from your mouth and breathe out normally. Rest for a few seconds and repeat Steps 1 through 7 at least 10 times every 1-2 hours when you are awake. Take your time and take a few normal breaths between deep breaths. The spirometer may include an indicator to show your best effort. Use the indicator as a goal to work toward during each repetition. After each set of 10 deep breaths, practice coughing to be sure your lungs are clear. If you have an incision (the cut made at the time of surgery), support your incision when coughing by placing a pillow or rolled up towels firmly against it. Once you are able to get out of bed, walk around indoors and cough well. You may stop using the incentive spirometer when instructed by your caregiver.  RISKS AND COMPLICATIONS Take your time so you do not get dizzy or light-headed. If you are in pain, you may need to take or ask for pain medication before doing incentive spirometry. It is harder to take a deep breath if you are having pain. AFTER USE Rest and breathe slowly and easily. It can be helpful to keep track of a log of your progress. Your caregiver can provide you with a simple table to help with this. If you are using the spirometer at home, follow these instructions: SEEK MEDICAL CARE IF:  You are having difficultly using the spirometer. You have trouble using the spirometer as often as instructed. Your pain medication is not giving enough relief while using the spirometer. You develop fever of 100.5 F (38.1 C) or higher. SEEK IMMEDIATE MEDICAL CARE IF:  You cough up bloody sputum that had not been present before. You develop fever of 102 F (38.9 C) or greater. You develop worsening pain at or near the incision site. MAKE SURE YOU:  Understand these instructions. Will watch your condition. Will get help right away if you are not doing well or  get worse. Document Released: 06/30/2006 Document Revised: 05/12/2011 Document Reviewed: 08/31/2006 [facility] Patient Information 2014 [place], [place].

## 2023-10-19 ENCOUNTER — Observation Stay (HOSPITAL_COMMUNITY)

## 2023-10-19 ENCOUNTER — Ambulatory Visit (HOSPITAL_COMMUNITY): Payer: Self-pay | Admitting: Physician Assistant

## 2023-10-19 ENCOUNTER — Observation Stay (HOSPITAL_COMMUNITY)
Admission: RE | Admit: 2023-10-19 | Discharge: 2023-10-20 | Disposition: A | Discharge status: Home / Self Care | Source: Ambulatory Visit | Attending: Orthopedic Surgery | Admitting: Orthopedic Surgery

## 2023-10-19 ENCOUNTER — Encounter (HOSPITAL_COMMUNITY)
Admission: RE | Disposition: A | Discharge status: Home / Self Care | Payer: Self-pay | Source: Ambulatory Visit | Attending: Orthopedic Surgery

## 2023-10-19 ENCOUNTER — Ambulatory Visit (HOSPITAL_BASED_OUTPATIENT_CLINIC_OR_DEPARTMENT_OTHER): Payer: Self-pay | Admitting: Anesthesiology

## 2023-10-19 ENCOUNTER — Other Ambulatory Visit: Payer: Self-pay

## 2023-10-19 ENCOUNTER — Encounter (HOSPITAL_COMMUNITY): Payer: Self-pay | Admitting: Orthopedic Surgery

## 2023-10-19 DIAGNOSIS — Z85528 Personal history of other malignant neoplasm of kidney: Secondary | ICD-10-CM | POA: Insufficient documentation

## 2023-10-19 DIAGNOSIS — Z8509 Personal history of malignant neoplasm of other digestive organs: Secondary | ICD-10-CM | POA: Diagnosis not present

## 2023-10-19 DIAGNOSIS — Z01818 Encounter for other preprocedural examination: Principal | ICD-10-CM

## 2023-10-19 DIAGNOSIS — Z79899 Other long term (current) drug therapy: Secondary | ICD-10-CM | POA: Insufficient documentation

## 2023-10-19 DIAGNOSIS — N183 Chronic kidney disease, stage 3 unspecified: Secondary | ICD-10-CM | POA: Diagnosis not present

## 2023-10-19 DIAGNOSIS — I129 Hypertensive chronic kidney disease with stage 1 through stage 4 chronic kidney disease, or unspecified chronic kidney disease: Secondary | ICD-10-CM | POA: Insufficient documentation

## 2023-10-19 DIAGNOSIS — M1711 Unilateral primary osteoarthritis, right knee: Secondary | ICD-10-CM

## 2023-10-19 DIAGNOSIS — Z87891 Personal history of nicotine dependence: Secondary | ICD-10-CM | POA: Diagnosis not present

## 2023-10-19 DIAGNOSIS — M25561 Pain in right knee: Secondary | ICD-10-CM | POA: Diagnosis present

## 2023-10-19 HISTORY — PX: TOTAL KNEE ARTHROPLASTY: SHX125

## 2023-10-19 LAB — TYPE AND SCREEN
ABO/RH(D): O POS
Antibody Screen: NEGATIVE

## 2023-10-19 SURGERY — ARTHROPLASTY, KNEE, TOTAL
Anesthesia: Spinal | Site: Knee | Laterality: Right

## 2023-10-19 MED ORDER — ACETAMINOPHEN 500 MG PO TABS
1000.0000 mg | ORAL_TABLET | Freq: Once | ORAL | Status: AC
  Administered 2023-10-19: 1000 mg via ORAL
  Filled 2023-10-19: qty 2

## 2023-10-19 MED ORDER — PHENOL 1.4 % MT LIQD
1.0000 | OROMUCOSAL | Status: DC | PRN
Start: 2023-10-19 — End: 2023-10-20

## 2023-10-19 MED ORDER — LACTATED RINGERS IV SOLN: INTRAVENOUS | Status: DC

## 2023-10-19 MED ORDER — SODIUM CHLORIDE 0.9 % IV SOLN: INTRAVENOUS | Status: DC

## 2023-10-19 MED ORDER — APIXABAN 2.5 MG PO TABS: 2.5000 mg | ORAL_TABLET | Freq: Two times a day (BID) | ORAL | 0 refills | Status: AC

## 2023-10-19 MED ORDER — OXYCODONE HCL 5 MG PO TABS: 5.0000 mg | ORAL_TABLET | ORAL | 0 refills | Status: AC | PRN

## 2023-10-19 MED ORDER — POLYVINYL ALCOHOL 1.4 % OP SOLN: 1.0000 [drp] | Freq: Every day | OPHTHALMIC | Status: DC | PRN

## 2023-10-19 MED ORDER — PROPOFOL 10 MG/ML IV BOLUS
INTRAVENOUS | Status: DC | PRN
  Administered 2023-10-19: 40 mg via INTRAVENOUS

## 2023-10-19 MED ORDER — METHOCARBAMOL 500 MG PO TABS
500.0000 mg | ORAL_TABLET | Freq: Four times a day (QID) | ORAL | Status: DC | PRN
  Administered 2023-10-19 – 2023-10-20 (×2): 500 mg via ORAL
  Filled 2023-10-19 (×2): qty 1

## 2023-10-19 MED ORDER — POLYETHYLENE GLYCOL 3350 17 G PO PACK: 17.0000 g | PACK | Freq: Every day | ORAL | 0 refills | Status: AC

## 2023-10-19 MED ORDER — PROPOFOL 1000 MG/100ML IV EMUL
INTRAVENOUS | Status: AC
  Filled 2023-10-19: qty 100

## 2023-10-19 MED ORDER — 0.9 % SODIUM CHLORIDE (POUR BTL) OPTIME
TOPICAL | Status: DC | PRN
  Administered 2023-10-19: 1000 mL

## 2023-10-19 MED ORDER — CHLORHEXIDINE GLUCONATE 0.12 % MT SOLN
15.0000 mL | Freq: Once | OROMUCOSAL | Status: AC
  Administered 2023-10-19: 15 mL via OROMUCOSAL

## 2023-10-19 MED ORDER — DIPHENHYDRAMINE HCL 12.5 MG/5ML PO ELIX: 12.5000 mg | ORAL_SOLUTION | ORAL | Status: DC | PRN

## 2023-10-19 MED ORDER — DEXAMETHASONE SODIUM PHOSPHATE 10 MG/ML IJ SOLN
INTRAMUSCULAR | Status: AC
  Filled 2023-10-19: qty 1

## 2023-10-19 MED ORDER — ACETAMINOPHEN 325 MG PO TABS: 325.0000 mg | ORAL_TABLET | Freq: Four times a day (QID) | ORAL | Status: DC | PRN

## 2023-10-19 MED ORDER — FENTANYL CITRATE PF 50 MCG/ML IJ SOSY
50.0000 ug | PREFILLED_SYRINGE | INTRAMUSCULAR | Status: DC
  Administered 2023-10-19: 50 ug via INTRAVENOUS
  Filled 2023-10-19: qty 2

## 2023-10-19 MED ORDER — ACETAMINOPHEN 500 MG PO TABS
1000.0000 mg | ORAL_TABLET | Freq: Four times a day (QID) | ORAL | Status: AC
  Administered 2023-10-19 – 2023-10-20 (×4): 1000 mg via ORAL
  Filled 2023-10-19 (×4): qty 2

## 2023-10-19 MED ORDER — CEFAZOLIN SODIUM-DEXTROSE 2-4 GM/100ML-% IV SOLN
2.0000 g | INTRAVENOUS | Status: AC
  Administered 2023-10-19: 2 g via INTRAVENOUS
  Filled 2023-10-19: qty 100

## 2023-10-19 MED ORDER — ZOLPIDEM TARTRATE 5 MG PO TABS: 5.0000 mg | ORAL_TABLET | Freq: Every evening | ORAL | Status: DC | PRN

## 2023-10-19 MED ORDER — PANTOPRAZOLE SODIUM 40 MG PO TBEC
40.0000 mg | DELAYED_RELEASE_TABLET | Freq: Every day | ORAL | Status: DC
  Administered 2023-10-20: 40 mg via ORAL
  Filled 2023-10-19: qty 1

## 2023-10-19 MED ORDER — OXYCODONE HCL 5 MG PO TABS
5.0000 mg | ORAL_TABLET | ORAL | Status: DC | PRN
  Administered 2023-10-19: 5 mg via ORAL
  Administered 2023-10-20 (×3): 10 mg via ORAL
  Filled 2023-10-19: qty 1
  Filled 2023-10-19 (×3): qty 2

## 2023-10-19 MED ORDER — BUPIVACAINE IN DEXTROSE 0.75-8.25 % IT SOLN
INTRATHECAL | Status: DC | PRN
Start: 2023-10-19 — End: 2023-10-19
  Administered 2023-10-19: 1.8 mL via INTRATHECAL

## 2023-10-19 MED ORDER — ROSUVASTATIN CALCIUM 10 MG PO TABS
10.0000 mg | ORAL_TABLET | Freq: Every day | ORAL | Status: DC
Start: 2023-10-20 — End: 2023-10-20
  Filled 2023-10-19: qty 1

## 2023-10-19 MED ORDER — LIDOCAINE HCL (PF) 2 % IJ SOLN
INTRAMUSCULAR | Status: DC | PRN
  Administered 2023-10-19: 100 mg via INTRADERMAL

## 2023-10-19 MED ORDER — NITROGLYCERIN 0.4 MG SL SUBL: 0.4000 mg | SUBLINGUAL_TABLET | SUBLINGUAL | Status: DC | PRN

## 2023-10-19 MED ORDER — DOCUSATE SODIUM 100 MG PO CAPS
100.0000 mg | ORAL_CAPSULE | Freq: Two times a day (BID) | ORAL | Status: DC
  Administered 2023-10-19 – 2023-10-20 (×2): 100 mg via ORAL
  Filled 2023-10-19 (×2): qty 1

## 2023-10-19 MED ORDER — ONDANSETRON HCL 4 MG/2ML IJ SOLN
INTRAMUSCULAR | Status: DC | PRN
  Administered 2023-10-19: 4 mg via INTRAVENOUS

## 2023-10-19 MED ORDER — SODIUM CHLORIDE 0.9 % IR SOLN
Status: DC | PRN
  Administered 2023-10-19: 1000 mL

## 2023-10-19 MED ORDER — SODIUM CHLORIDE (PF) 0.9 % IJ SOLN
INTRAMUSCULAR | Status: AC
  Filled 2023-10-19: qty 30

## 2023-10-19 MED ORDER — TRANEXAMIC ACID-NACL 1000-0.7 MG/100ML-% IV SOLN
1000.0000 mg | INTRAVENOUS | Status: AC
  Administered 2023-10-19: 1000 mg via INTRAVENOUS
  Filled 2023-10-19: qty 100

## 2023-10-19 MED ORDER — POVIDONE-IODINE 10 % EX SWAB: 2.0000 | Freq: Once | CUTANEOUS | Status: DC

## 2023-10-19 MED ORDER — POLYETHYLENE GLYCOL 3350 17 G PO PACK: 17.0000 g | PACK | Freq: Every day | ORAL | Status: DC | PRN

## 2023-10-19 MED ORDER — METHOCARBAMOL 500 MG PO TABS: 500.0000 mg | ORAL_TABLET | Freq: Three times a day (TID) | ORAL | 0 refills | Status: AC | PRN

## 2023-10-19 MED ORDER — FINASTERIDE 5 MG PO TABS
5.0000 mg | ORAL_TABLET | Freq: Every evening | ORAL | Status: DC
  Administered 2023-10-19: 5 mg via ORAL
  Filled 2023-10-19: qty 1

## 2023-10-19 MED ORDER — BUPIVACAINE LIPOSOME 1.3 % IJ SUSP
INTRAMUSCULAR | Status: AC
  Filled 2023-10-19: qty 20

## 2023-10-19 MED ORDER — DEXAMETHASONE SODIUM PHOSPHATE 10 MG/ML IJ SOLN
8.0000 mg | Freq: Once | INTRAMUSCULAR | Status: AC
  Administered 2023-10-19: 8 mg via INTRAVENOUS

## 2023-10-19 MED ORDER — ONDANSETRON HCL 4 MG PO TABS: 4.0000 mg | ORAL_TABLET | Freq: Three times a day (TID) | ORAL | 0 refills | Status: AC | PRN

## 2023-10-19 MED ORDER — BUPIVACAINE-EPINEPHRINE (PF) 0.25% -1:200000 IJ SOLN
INTRAMUSCULAR | Status: AC
  Filled 2023-10-19: qty 30

## 2023-10-19 MED ORDER — MIDAZOLAM HCL 2 MG/2ML IJ SOLN: 1.0000 mg | INTRAMUSCULAR | Status: DC

## 2023-10-19 MED ORDER — BUPIVACAINE LIPOSOME 1.3 % IJ SUSP
INTRAMUSCULAR | Status: DC | PRN
  Administered 2023-10-19: 80 mL

## 2023-10-19 MED ORDER — ISOPROPYL ALCOHOL 70 % SOLN
Status: DC | PRN
  Administered 2023-10-19: 1 via TOPICAL

## 2023-10-19 MED ORDER — APIXABAN 2.5 MG PO TABS
2.5000 mg | ORAL_TABLET | Freq: Two times a day (BID) | ORAL | Status: DC
  Administered 2023-10-20: 2.5 mg via ORAL
  Filled 2023-10-19: qty 1

## 2023-10-19 MED ORDER — PROPOFOL 500 MG/50ML IV EMUL
INTRAVENOUS | Status: DC | PRN
  Administered 2023-10-19: 35 ug/kg/min via INTRAVENOUS

## 2023-10-19 MED ORDER — CEFAZOLIN SODIUM-DEXTROSE 2-4 GM/100ML-% IV SOLN
2.0000 g | Freq: Four times a day (QID) | INTRAVENOUS | Status: AC
  Administered 2023-10-19 – 2023-10-20 (×2): 2 g via INTRAVENOUS
  Filled 2023-10-19 (×2): qty 100

## 2023-10-19 MED ORDER — ONDANSETRON HCL 4 MG/2ML IJ SOLN
INTRAMUSCULAR | Status: AC
  Filled 2023-10-19: qty 2

## 2023-10-19 MED ORDER — BUPIVACAINE LIPOSOME 1.3 % IJ SUSP: 20.0000 mL | Freq: Once | INTRAMUSCULAR | Status: DC

## 2023-10-19 MED ORDER — PHENYLEPHRINE HCL-NACL 20-0.9 MG/250ML-% IV SOLN
INTRAVENOUS | Status: DC | PRN
  Administered 2023-10-19: 40 ug/min via INTRAVENOUS

## 2023-10-19 MED ORDER — ACETAMINOPHEN 500 MG PO TABS: 1000.0000 mg | ORAL_TABLET | Freq: Three times a day (TID) | ORAL | Status: AC | PRN

## 2023-10-19 MED ORDER — ORAL CARE MOUTH RINSE: 15.0000 mL | Freq: Once | OROMUCOSAL | Status: AC

## 2023-10-19 MED ORDER — LISINOPRIL 20 MG PO TABS
20.0000 mg | ORAL_TABLET | Freq: Every day | ORAL | Status: DC
  Administered 2023-10-20: 20 mg via ORAL
  Filled 2023-10-19: qty 1

## 2023-10-19 MED ORDER — BUPIVACAINE-EPINEPHRINE (PF) 0.5% -1:200000 IJ SOLN
INTRAMUSCULAR | Status: DC | PRN
  Administered 2023-10-19: 20 mL via PERINEURAL

## 2023-10-19 MED ORDER — ONDANSETRON HCL 4 MG PO TABS: 4.0000 mg | ORAL_TABLET | Freq: Four times a day (QID) | ORAL | Status: DC | PRN

## 2023-10-19 MED ORDER — HYDROMORPHONE HCL 1 MG/ML IJ SOLN
0.5000 mg | INTRAMUSCULAR | Status: DC | PRN
  Administered 2023-10-20: 0.5 mg via INTRAVENOUS
  Filled 2023-10-19: qty 1

## 2023-10-19 MED ORDER — LIDOCAINE HCL (PF) 2 % IJ SOLN
INTRAMUSCULAR | Status: AC
  Filled 2023-10-19: qty 5

## 2023-10-19 MED ORDER — ONDANSETRON HCL 4 MG/2ML IJ SOLN: 4.0000 mg | Freq: Four times a day (QID) | INTRAMUSCULAR | Status: DC | PRN

## 2023-10-19 MED ORDER — FENTANYL CITRATE PF 50 MCG/ML IJ SOSY: 25.0000 ug | PREFILLED_SYRINGE | INTRAMUSCULAR | Status: DC | PRN

## 2023-10-19 MED ORDER — METHOCARBAMOL 1000 MG/10ML IJ SOLN: 500.0000 mg | Freq: Four times a day (QID) | INTRAMUSCULAR | Status: DC | PRN

## 2023-10-19 MED ORDER — MENTHOL 3 MG MT LOZG: 1.0000 | LOZENGE | OROMUCOSAL | Status: DC | PRN

## 2023-10-19 SURGICAL SUPPLY — 51 items
BAG COUNTER SPONGE SURGICOUNT (BAG) IMPLANT
BLADE SAG 18X100X1.27 (BLADE) ×1 IMPLANT
BLADE SAW SAG 35X64 .89 (BLADE) ×1 IMPLANT
BLADE SAW SGTL 11.0X1.19X90.0M (BLADE) IMPLANT
BNDG COHESIVE 3X5 TAN ST LF (GAUZE/BANDAGES/DRESSINGS) ×1 IMPLANT
BNDG ELASTIC 6X10 VLCR STRL LF (GAUZE/BANDAGES/DRESSINGS) ×1 IMPLANT
BOWL SMART MIX CTS (DISPOSABLE) IMPLANT
CEMENT BONE R 1X40 (Cement) IMPLANT
CHLORAPREP W/TINT 26 (MISCELLANEOUS) ×2 IMPLANT
COMPONENT FEM CMT STD PS 12 RT (Joint) IMPLANT
COVER SURGICAL LIGHT HANDLE (MISCELLANEOUS) ×1 IMPLANT
CUFF TRNQT CYL 34X4.125X (TOURNIQUET CUFF) ×1 IMPLANT
DERMABOND ADVANCED .7 DNX12 (GAUZE/BANDAGES/DRESSINGS) ×1 IMPLANT
DRAPE INCISE IOBAN 85X60 (DRAPES) ×1 IMPLANT
DRAPE SHEET LG 3/4 BI-LAMINATE (DRAPES) ×1 IMPLANT
DRAPE U-SHAPE 47X51 STRL (DRAPES) ×1 IMPLANT
DRSG AQUACEL AG ADV 3.5X10 (GAUZE/BANDAGES/DRESSINGS) ×1 IMPLANT
ELECT REM PT RETURN 15FT ADLT (MISCELLANEOUS) ×1 IMPLANT
GAUZE SPONGE 4X4 12PLY STRL (GAUZE/BANDAGES/DRESSINGS) ×1 IMPLANT
GLOVE BIO SURGEON STRL SZ 6.5 (GLOVE) ×2 IMPLANT
GLOVE BIOGEL PI IND STRL 6.5 (GLOVE) ×1 IMPLANT
GLOVE BIOGEL PI IND STRL 8 (GLOVE) ×1 IMPLANT
GLOVE SURG ORTHO 8.0 STRL STRW (GLOVE) ×2 IMPLANT
GOWN STRL REUS W/ TWL XL LVL3 (GOWN DISPOSABLE) ×2 IMPLANT
HOLDER FOLEY CATH W/STRAP (MISCELLANEOUS) ×1 IMPLANT
HOOD PEEL AWAY T7 (MISCELLANEOUS) ×3 IMPLANT
KIT TURNOVER KIT A (KITS) ×1 IMPLANT
KNEE SYSTEM MC 10 RT (Knees) IMPLANT
MANIFOLD NEPTUNE II (INSTRUMENTS) ×1 IMPLANT
MARKER SKIN DUAL TIP RULER LAB (MISCELLANEOUS) ×1 IMPLANT
NS IRRIG 1000ML POUR BTL (IV SOLUTION) ×1 IMPLANT
PACK TOTAL KNEE CUSTOM (KITS) ×1 IMPLANT
PENCIL SMOKE EVACUATOR (MISCELLANEOUS) ×1 IMPLANT
PIN DRILL HDLS TROCAR 75 4PK (PIN) IMPLANT
SCREW HEADED 33MM KNEE (MISCELLANEOUS) IMPLANT
SET HNDPC FAN SPRY TIP SCT (DISPOSABLE) ×1 IMPLANT
SOLUTION IRRIG SURGIPHOR (IV SOLUTION) IMPLANT
SOLUTION PRONTOSAN WOUND 350ML (IRRIGATION / IRRIGATOR) IMPLANT
STEM POLY PAT PLY 38M KNEE (Knees) IMPLANT
STEM TIBIA 5 DEG SZ G R KNEE (Knees) IMPLANT
STRIP CLOSURE SKIN 1/2X4 (GAUZE/BANDAGES/DRESSINGS) ×1 IMPLANT
SUT MNCRL AB 3-0 PS2 18 (SUTURE) ×1 IMPLANT
SUT STRATAFIX 14 PDO 48 VLT (SUTURE) ×1 IMPLANT
SUT VIC AB 2-0 CT2 27 (SUTURE) ×2 IMPLANT
SUTURE STRATFX 0 PDS 27 VIOLET (SUTURE) ×1 IMPLANT
SYR 50ML LL SCALE MARK (SYRINGE) ×1 IMPLANT
TRAY FOLEY MTR SLVR 14FR STAT (SET/KITS/TRAYS/PACK) IMPLANT
TRAY FOLEY MTR SLVR 16FR STAT (SET/KITS/TRAYS/PACK) IMPLANT
TUBE SUCTION HIGH CAP CLEAR NV (SUCTIONS) ×1 IMPLANT
UNDERPAD 30X36 HEAVY ABSORB (UNDERPADS AND DIAPERS) ×1 IMPLANT
WRAP KNEE MAXI GEL POST OP (GAUZE/BANDAGES/DRESSINGS) ×1 IMPLANT

## 2023-10-19 NOTE — Transfer of Care (Signed)
 Immediate Anesthesia Transfer of Care Note  Patient: Mike Roach  Procedure(s) Performed: ARTHROPLASTY, KNEE, TOTAL (Right: Knee)  Patient Location: PACU  Anesthesia Type:MAC, Regional, and Spinal  Level of Consciousness: drowsy and patient cooperative  Airway & Oxygen Therapy: Patient Spontanous Breathing and Patient connected to face mask oxygen  Post-op Assessment: Report given to RN and Post -op Vital signs reviewed and stable  Post vital signs: Reviewed   Last Vitals:  Vitals Value Taken Time  BP 132/79 10/19/23 16:00  Temp    Pulse 66 10/19/23 16:08  Resp 21 10/19/23 16:08  SpO2 100 % 10/19/23 16:08  Vitals shown include unfiled device data.  Last Pain:  Vitals:   10/19/23 1034  TempSrc:   PainSc: 0-No pain         Complications: No notable events documented.

## 2023-10-19 NOTE — Progress Notes (Signed)
Patients hearing aids returned 

## 2023-10-19 NOTE — Op Note (Signed)
 DATE OF SURGERY:  10/19/2023 TIME: 3:23 PM  PATIENT NAME:  Mike Roach   AGE: 83 y.o.    PRE-OPERATIVE DIAGNOSIS: End-stage right knee osteoarthritis  POST-OPERATIVE DIAGNOSIS:  Same  PROCEDURE: Cemented right total Knee Arthroplasty  SURGEON:  Jaquane Boughner A Dez Stauffer, MD   ASSISTANT: Bernarda Mclean, PA-C, present and scrubbed throughout the case, critical for assistance with exposure, retraction, instrumentation, and closure.   OPERATIVE IMPLANTS:  Cemented Zimmer persona size 12 standard CR femur, size G right tibial baseplate, 10 mm MC poly, 38 mm all poly patella Implant Name Type Inv. Item Serial No. Manufacturer Lot No. LRB No. Used Action  CEMENT BONE R 1X40 - [id number] Cement CEMENT BONE R 1X40  ZIMMER RECON(ORTH,TRAU,BIO,SG) [id number] Right 2 Implanted  STEM POLY PAT PLY 91M KNEE - [id number] Knees STEM POLY PAT PLY 91M KNEE  ZIMMER RECON(ORTH,TRAU,BIO,SG) [id number] Right 1 Implanted  CEMENT BONE R 1X40 - [id number] Cement CEMENT BONE R 1X40  ZIMMER RECON(ORTH,TRAU,BIO,SG) [id number] Right 1 Implanted  STEM TIBIA 5 DEG SZ G R KNEE - [id number] Knees STEM TIBIA 5 DEG SZ G R KNEE  ZIMMER RECON(ORTH,TRAU,BIO,SG) [id number] Right 1 Implanted  COMPONENT FEM CMT STD PS 12 RT - [id number] Joint COMPONENT FEM CMT STD PS 12 RT  ZIMMER RECON(ORTH,TRAU,BIO,SG) [id number] Right 1 Implanted  KNEE SYSTEM MC 10 RT - [id number] Knees KNEE SYSTEM MC 10 RT  ZIMMER RECON(ORTH,TRAU,BIO,SG) [id number] Right 1 Implanted      PREOPERATIVE INDICATIONS:  Mike Roach is a 83 y.o. year old male with end stage bone on bone degenerative arthritis of the knee who failed conservative treatment, including injections, antiinflammatories, activity modification, and assistive devices, and had significant impairment of their activities of daily living, and elected for Total Knee Arthroplasty.   The risks, benefits, and alternatives were discussed at length including but not limited to the risks of infection,  bleeding, nerve injury, stiffness, blood clots, the need for revision surgery, cardiopulmonary complications, among others, and they were willing to proceed.   ESTIMATED BLOOD LOSS: 50cc  OPERATIVE DESCRIPTION:   Once adequate anesthesia was induced, preoperative antibiotics, 2 gm of ancef ,1 gm of Tranexamic Acid , and 8 mg of Decadron  administered, the patient was positioned supine with a right thigh tourniquet placed.  The right lower extremity was prepped and draped in sterile fashion.  A time-  out was performed identifying the patient, planned procedure, and the appropriate extremity.     The leg was  exsanguinated, tourniquet elevated to 250 mmHg.  A midline incision was  made followed by median parapatellar arthrotomy. Anterior horn of the medial meniscus was released and resected. A medial release was performed, the infrapatellar fat pad was resected with care taken to protect the patellar tendon. The suprapatellar fat was removed to exposed the distal anterior femur. The anterior horn of the lateral meniscus and ACL were released.    Following initial  exposure, I first started with the femur  The femoral  canal was opened with a drill, canal was suctioned to try to prevent fat emboli.  An  intramedullary rod was passed set at 6 degrees valgus, 10 mm. The distal femur was resected.  Following this resection, the tibia was  subluxated anteriorly.  Using the extramedullary guide, 10 mm of bone was resected off   the proximal lateral tibia.  We confirmed the gap would be  stable medially and laterally with a size 10mm spacer block as well as confirmed that the tibial cut was perpendicular in the coronal plane,  checking with an alignment rod.    Once this was done, the posterior femoral referencing femoral sizer was placed under to the posterior condyles with 3 degrees of external rotational which was parallel to the transepicondylar axis and perpendicular to [employer]. The femur was sized to  be a size 12 in the anterior-  posterior dimension. The  anterior, posterior, and  chamfer cuts were made without difficulty nor   notching making certain that I was along the anterior cortex to help  with flexion gap stability. Next a laminar spreader was placed with the knee in flexion and the medial lateral menisci were resected.  5 cc of the Exparel  mixture was injected in the medial side of the back of the knee and 3 cc in the lateral side.  1/2 inch curved osteotome was used to resect posterior osteophyte that was then removed with a pituitary rongeur.       At this point, the tibia was sized to be a size G.  The size G tray was  then pinned in position. Trial reduction was now carried with a 12 femur, G tibia, a 10 mm MC insert.  The knee had full extension and was stable to varus valgus stress in extension.  The knee was slightly tight in flexion and the PCL was partially released.   Attention was next directed to the patella.  Precut  measurement was noted to be 28 mm.  I resected down to 18 mm and used a  38mm patellar button to restore patellar height as well as cover the cut surface.     The patella lug holes were drilled and a 38mm patella poly trial was placed.    The knee was brought to full extension with good flexion stability with the patella tracking through the trochlea without application of pressure.     Next the femoral component was again assessed and determined to be seated and appropriately lateralized.  The femoral lug holes were drilled.  The femoral component was then removed. Tibial component was again assessed and felt to be seated and appropriately rotated with the medial third of the tubercle. The tibia was then drilled, and keel punched.     Final components were  opened and  cement was mixed.      Final implants were then  cemented onto cleaned and dried cut surfaces of bone with the knee brought to extension with a 10mm MC poly.  The knee was irrigated with  sterile Betadine  diluted in saline as well as pulse lavage normal saline.  The synovial lining was  then injected a dilute Exparel  with 30cc of 0.25% marcaine  with epinephrine .         Once the cement had fully cured, excess cement was removed throughout the knee.  I confirmed that I was satisfied with the range of motion and stability, and the final 10 mm MC poly insert was chosen.  It was placed into the knee.         The tourniquet had been let down at 60 minutes.  No significant hemostasis was required.  The medial parapatellar arthrotomy was then reapproximated using #1 Stratafix sutures with the knee  in flexion.  The remaining wound was closed with 0 stratafix, 2-0 Vicryl, and running 3-0 Monocryl. The knee was cleaned, dried, dressed sterilely using Dermabond and   Aquacel dressing.  The patient was then brought to recovery room in stable condition, tolerating the procedure  well. There were no complications.  Post op recs: WB: WBAT Abx: ancef  Imaging: PACU xrays DVT prophylaxis: Eliquis  2.5mg  BID x4 weeks Follow up: 2 weeks after surgery for a wound check with Dr. Edna at [facility].  Address: [address], [place], [place] [address]  Office Phone: [phone]  Toribio Edna, MD Orthopaedic Surgery

## 2023-10-19 NOTE — Interval H&P Note (Signed)

## 2023-10-19 NOTE — Anesthesia Procedure Notes (Signed)
 Spinal  Patient location during procedure: OR Start time: 10/19/2023 1:33 PM End time: 10/19/2023 1:38 PM Reason for block: surgical anesthesia Staffing Performed: anesthesiologist  Anesthesiologist: Epifanio Fallow, MD Performed by: Epifanio Fallow, MD Authorized by: Epifanio Fallow, MD   Preanesthetic Checklist Completed: patient identified, IV checked, risks and benefits discussed, surgical consent, monitors and equipment checked, pre-op evaluation and timeout performed Spinal Block Patient position: sitting Prep: DuraPrep Patient monitoring: cardiac monitor, continuous pulse ox and blood pressure Approach: midline Location: L3-4 Injection technique: single-shot Needle Needle type: Pencan  Needle gauge: 24 G Needle length: 9 cm Assessment Sensory level: T8 Events: CSF return Additional Notes Functioning IV was confirmed and monitors were applied. Sterile prep and drape, including hand hygiene and sterile gloves were used. The patient was positioned and the spine was prepped. The skin was anesthetized with lidocaine .  Free flow of clear CSF was obtained prior to injecting local anesthetic into the CSF.  The spinal needle aspirated freely following injection.  The needle was carefully withdrawn.  The patient tolerated the procedure well.

## 2023-10-19 NOTE — Anesthesia Preprocedure Evaluation (Addendum)
 Anesthesia Evaluation  Patient identified by MRN, date of birth, ID band Patient awake    Reviewed: Allergy & Precautions, H&P , NPO status , Patient's Chart, lab work & pertinent test results  Airway Mallampati: III  TM Distance: >3 FB Neck ROM: Full    Dental no notable dental hx. (+) Teeth Intact, Dental Advisory Given   Pulmonary former smoker   Pulmonary exam normal breath sounds clear to auscultation       Cardiovascular hypertension, Pt. on medications + dysrhythmias Atrial Fibrillation  Rhythm:Regular Rate:Normal     Neuro/Psych negative neurological ROS  negative psych ROS   GI/Hepatic Neg liver ROS,GERD  Medicated,,  Endo/Other  negative endocrine ROS    Renal/GU Renal InsufficiencyRenal disease  negative genitourinary   Musculoskeletal  (+) Arthritis , Osteoarthritis,    Abdominal   Peds  Hematology  (+) Blood dyscrasia, anemia   Anesthesia Other Findings   Reproductive/Obstetrics negative OB ROS                              Anesthesia Physical Anesthesia Plan  ASA: 3  Anesthesia Plan: Spinal   Post-op Pain Management: Regional block* and Tylenol  PO (pre-op)*   Induction: Intravenous  PONV Risk Score and Plan: 2 and Propofol  infusion and Ondansetron   Airway Management Planned: Natural Airway and Simple Face Mask  Additional Equipment:   Intra-op Plan:   Post-operative Plan:   Informed Consent: I have reviewed the patients History and Physical, chart, labs and discussed the procedure including the risks, benefits and alternatives for the proposed anesthesia with the patient or authorized representative who has indicated his/her understanding and acceptance.     Dental advisory given  Plan Discussed with: CRNA  Anesthesia Plan Comments:          Anesthesia Quick Evaluation

## 2023-10-19 NOTE — Anesthesia Postprocedure Evaluation (Signed)
 Anesthesia Post Note  Patient: Mike Roach  Procedure(s) Performed: ARTHROPLASTY, KNEE, TOTAL (Right: Knee)     Patient location during evaluation: PACU Anesthesia Type: Spinal Level of consciousness: awake and alert Pain management: pain level controlled Vital Signs Assessment: post-procedure vital signs reviewed and stable Respiratory status: spontaneous breathing, nonlabored ventilation, respiratory function stable and patient connected to nasal cannula oxygen Cardiovascular status: blood pressure returned to baseline and stable Postop Assessment: no apparent nausea or vomiting Anesthetic complications: no   No notable events documented.  Last Vitals:  Vitals:   10/19/23 1739 10/19/23 1751  BP:  138/75  Pulse:  69  Resp:  16  Temp:  36.6 C  SpO2: 97% 97%    Last Pain:  Vitals:   10/19/23 1752  TempSrc:   PainSc: 0-No pain                 Thom JONELLE Peoples

## 2023-10-19 NOTE — Discharge Instructions (Addendum)
 INSTRUCTIONS AFTER JOINT REPLACEMENT   Remove items at home which could result in a fall. This includes throw rugs or furniture in walking pathways ICE to the affected joint every three hours while awake for 30 minutes at a time, for at least the first 3-5 days, and then as needed for pain and swelling.  Continue to use ice for pain and swelling. You may notice swelling that will progress down to the foot and ankle.  This is normal after surgery.  Elevate your leg when you are not up walking on it.   Continue to use the breathing machine you got in the hospital (incentive spirometer) which will help keep your temperature down.  It is common for your temperature to cycle up and down following surgery, especially at night when you are not up moving around and exerting yourself.  The breathing machine keeps your lungs expanded and your temperature down.  DIET:  As you were doing prior to hospitalization, we recommend a well-balanced diet.  DRESSING / WOUND CARE / SHOWERING:  Keep the surgical dressing until follow up.  The dressing is water proof, so you can shower without any extra covering.  IF THE DRESSING FALLS OFF or the wound gets wet inside, change the dressing with sterile gauze.  Please use good hand washing techniques before changing the dressing.  Do not use any lotions or creams on the incision until instructed by your surgeon.    ACTIVITY  Increase activity slowly as tolerated, but follow the weight bearing instructions below.   No driving for 6 weeks or until further direction given by your physician.  You cannot drive while taking narcotics.  No lifting or carrying greater than 10 lbs. until further directed by your surgeon. Avoid periods of inactivity such as sitting longer than an hour when not asleep. This helps prevent blood clots.  You may return to work once you are authorized by your doctor.   WEIGHT BEARING: Weight bearing as tolerated with assist device (walker, cane, etc) as  directed, use it as long as suggested by your surgeon or therapist, typically at least 4-6 weeks.  EXERCISES  Results after joint replacement surgery are often greatly improved when you follow the exercise, range of motion and muscle strengthening exercises prescribed by your doctor. Safety measures are also important to protect the joint from further injury. Any time any of these exercises cause you to have increased pain or swelling, decrease what you are doing until you are comfortable again and then slowly increase them. If you have problems or questions, call your caregiver or physical therapist for advice.   Rehabilitation is important following a joint replacement. After just a few days of immobilization, the muscles of the leg can become weakened and shrink (atrophy).  These exercises are designed to build up the tone and strength of the thigh and leg muscles and to improve motion. Often times heat used for twenty to thirty minutes before working out will loosen up your tissues and help with improving the range of motion but do not use heat for the first two weeks following surgery (sometimes heat can increase post-operative swelling).   These exercises can be done on a training (exercise) mat, on the floor, on a table or on a bed. Use whatever works the best and is most comfortable for you.    Use music or television while you are exercising so that the exercises are a pleasant break in your day. This will make your life  better with the exercises acting as a break in your routine that you can look forward to.   Perform all exercises about fifteen times, three times per day or as directed.  You should exercise both the operative leg and the other leg as well.  Exercises include:   Quad Sets - Tighten up the muscle on the front of the thigh (Quad) and hold for 5-10 seconds.   Straight Leg Raises - With your knee straight (if you were given a brace, keep it on), lift the leg to 60 degrees, hold  for 3 seconds, and slowly lower the leg.  Perform this exercise against resistance later as your leg gets stronger.  Leg Slides: Lying on your back, slowly slide your foot toward your buttocks, bending your knee up off the floor (only go as far as is comfortable). Then slowly slide your foot back down until your leg is flat on the floor again.  Angel Wings: Lying on your back spread your legs to the side as far apart as you can without causing discomfort.  Hamstring Strength:  Lying on your back, push your heel against the floor with your leg straight by tightening up the muscles of your buttocks.  Repeat, but this time bend your knee to a comfortable angle, and push your heel against the floor.  You may put a pillow under the heel to make it more comfortable if necessary.   A rehabilitation program following joint replacement surgery can speed recovery and prevent re-injury in the future due to weakened muscles. Contact your doctor or a physical therapist for more information on knee rehabilitation.   CONSTIPATION:  Constipation is defined medically as fewer than three stools per week and severe constipation as less than one stool per week.  Even if you have a regular bowel pattern at home, your normal regimen is likely to be disrupted due to multiple reasons following surgery.  Combination of anesthesia, postoperative narcotics, change in appetite and fluid intake all can affect your bowels.   YOU MUST use at least one of the following options; they are listed in order of increasing strength to get the job done.  They are all available over the counter, and you may need to use some, POSSIBLY even all of these options:    Drink plenty of fluids (prune juice may be helpful) and high fiber foods Colace 100 mg by mouth twice a day  Senokot for constipation as directed and as needed Dulcolax (bisacodyl ), take with full glass of water  Miralax  (polyethylene glycol) once or twice a day as needed.  If you  have tried all these things and are unable to have a bowel movement in the first 3-4 days after surgery call either your surgeon or your primary doctor.    If you experience loose stools or diarrhea, hold the medications until you stool forms back up.  If your symptoms do not get better within 1 week or if they get worse, check with your doctor.  If you experience the worst abdominal pain ever or develop nausea or vomiting, please contact the office immediately for further recommendations for treatment.  ITCHING:  If you experience itching with your medications, try taking only a single pain pill, or even half a pain pill at a time.  You can also use Benadryl  over the counter for itching or also to help with sleep.   TED HOSE STOCKINGS:  Use stockings on both legs until for at least 2 weeks or  as directed by physician office. They may be removed at night for sleeping.  MEDICATIONS:  See your medication summary on the "After Visit Summary" that nursing will review with you.  You may have some home medications which will be placed on hold until you complete the course of blood thinner medication.  It is important for you to complete the blood thinner medication as prescribed.  Blood clot prevention (DVT Prophylaxis): After surgery you are at an increased risk for a blood clot.  You were prescribed a blood thinner, Eliquis  2.5mg , to be taken twice daily for a total of 4 weeks from surgery to help reduce your risk of getting a blood clot.  After finishing the Eliquis , you may return to taking your Aspirin .  Signs of a pulmonary embolus (blood clot in the lungs) include sudden short of breath, feeling lightheaded or dizzy, chest pain with a deep breath, rapid pulse rapid breathing.  Signs of a blood clot in your arms or legs include new unexplained swelling and cramping, warm, red or darkened skin around the painful area.  Please call the office or 911 right away if these signs or symptoms  develop.  PRECAUTIONS:   If you experience chest pain or shortness of breath - call 911 immediately for transfer to the hospital emergency department.   If you develop a fever greater that 101 F, purulent drainage from wound, increased redness or drainage from wound, foul odor from the wound/dressing, or calf pain - CONTACT YOUR SURGEON.                                                   FOLLOW-UP APPOINTMENTS:  If you do not already have a post-op appointment, please call the office for an appointment to be seen by your surgeon.  Guidelines for how soon to be seen are listed in your "After Visit Summary", but are typically between 2-3 weeks after surgery.  If you have a specialized bandage, you may be told to follow up 1 week after surgery.  OTHER INSTRUCTIONS:  Knee Replacement:  Do not place pillow under knee, focus on keeping the knee straight while resting.  Place foam block, curve side up under heel at all times except when walking.  DO NOT modify, tear, cut, or change the foam block in any way.  POST-OPERATIVE OPIOID TAPER INSTRUCTIONS: It is important to wean off of your opioid medication as soon as possible. If you do not need pain medication after your surgery it is ok to stop day one. Opioids include: Codeine, Hydrocodone (Norco, Vicodin), Oxycodone (Percocet, oxycontin ) and hydromorphone  amongst others.  Long term and even short term use of opiods can cause: Increased pain response Dependence Constipation Depression Respiratory depression And more.  Withdrawal symptoms can include Flu like symptoms Nausea, vomiting And more Techniques to manage these symptoms Hydrate well Eat regular healthy meals Stay active Use relaxation techniques(deep breathing, meditating, yoga) Do Not substitute Alcohol  to help with tapering If you have been on opioids for less than two weeks and do not have pain than it is ok to stop all together.  Plan to wean off of opioids This plan should start  within one week post op of your joint replacement. Maintain the same interval or time between taking each dose and first decrease the dose.  Cut the total daily intake of opioids  by one tablet each day Next start to increase the time between doses. The last dose that should be eliminated is the evening dose.   MAKE SURE YOU:  Understand these instructions.  Get help right away if you are not doing well or get worse.    Thank you for letting us  be a part of your medical care team.  It is a privilege we respect greatly.  We hope these instructions will help you stay on track for a fast and full recovery!   +++++++++++++++++++++++++++++++++++++++++++++++++++++++++++++++++++  Information on my medicine - ELIQUIS  (apixaban )  This medication education was reviewed with me or my healthcare representative as part of my discharge preparation.  Why was Eliquis  prescribed for you? Eliquis  was prescribed for you to reduce the risk of blood clots forming after orthopedic surgery.    What do You need to know about Eliquis ? Take your Eliquis  TWICE DAILY - one tablet in the morning and one tablet in the evening with or without food.  It would be best to take the dose about the same time each day.  If you have difficulty swallowing the tablet whole please discuss with your pharmacist how to take the medication safely.  Take Eliquis  exactly as prescribed by your doctor and DO NOT stop taking Eliquis  without talking to the doctor who prescribed the medication.  Stopping without other medication to take the place of Eliquis  may increase your risk of developing a clot.  After discharge, you should have regular check-up appointments with your healthcare provider that is prescribing your Eliquis .  What do you do if you miss a dose? If a dose of ELIQUIS  is not taken at the scheduled time, take it as soon as possible on the same day and twice-daily administration should be resumed.  The dose should  not be doubled to make up for a missed dose.  Do not take more than one tablet of ELIQUIS  at the same time.  Important Safety Information A possible side effect of Eliquis  is bleeding. You should call your healthcare provider right away if you experience any of the following: Bleeding from an injury or your nose that does not stop. Unusual colored urine (red or dark brown) or unusual colored stools (red or black). Unusual bruising for unknown reasons. A serious fall or if you hit your head (even if there is no bleeding).  Some medicines may interact with Eliquis  and might increase your risk of bleeding or clotting while on Eliquis . To help avoid this, consult your healthcare provider or pharmacist prior to using any new prescription or non-prescription medications, including herbals, vitamins, non-steroidal anti-inflammatory drugs (NSAIDs) and supplements.  This website has more information on Eliquis  (apixaban ): http://[email or web address] .com/eliquis dena

## 2023-10-19 NOTE — Progress Notes (Signed)
 Orthopedic Tech Progress Note Patient Details:  Mike Roach 1940-04-23 [id number] Applied bone foam per order.  Ortho Devices Type of Ortho Device: Bone foam zero knee Ortho Device/Splint Location: RLE Ortho Device/Splint Interventions: Ordered, Application, Adjustment   Post Interventions Patient Tolerated: Well Instructions Provided: Adjustment of device, Poper ambulation with device, Care of device  [name] 10/19/2023, 7:01 PM

## 2023-10-19 NOTE — Anesthesia Procedure Notes (Signed)
 Anesthesia Regional Block: Adductor canal block   Pre-Anesthetic Checklist: , timeout performed,  Correct Patient, Correct Site, Correct Laterality,  Correct Procedure, Correct Position, site marked,  Risks and benefits discussed,  Pre-op evaluation,  At surgeon's request and post-op pain management  Laterality: Right  Prep: Maximum Sterile Barrier Precautions used, chloraprep       Needles:  Injection technique: Single-shot  Needle Type: Echogenic Stimulator Needle     Needle Length: 9cm  Needle Gauge: 21     Additional Needles:   Procedures:,,,, ultrasound used (permanent image in chart),,    Narrative:  Start time: 10/19/2023 12:20 PM End time: 10/19/2023 12:30 PM Injection made incrementally with aspirations every 5 mL.  Performed by: Personally  Anesthesiologist: Epifanio Fallow, MD

## 2023-10-20 ENCOUNTER — Telehealth (HOSPITAL_COMMUNITY): Payer: Self-pay | Admitting: Pharmacy Technician

## 2023-10-20 ENCOUNTER — Other Ambulatory Visit (HOSPITAL_COMMUNITY): Payer: Self-pay

## 2023-10-20 ENCOUNTER — Encounter (HOSPITAL_COMMUNITY): Payer: Self-pay | Admitting: Orthopedic Surgery

## 2023-10-20 DIAGNOSIS — M1711 Unilateral primary osteoarthritis, right knee: Secondary | ICD-10-CM | POA: Diagnosis not present

## 2023-10-20 LAB — CBC
HCT: 27.3 % — ABNORMAL LOW (ref 39.0–52.0)
Hemoglobin: 8.9 g/dL — ABNORMAL LOW (ref 13.0–17.0)
MCH: 32.8 pg (ref 26.0–34.0)
MCHC: 32.6 g/dL (ref 30.0–36.0)
MCV: 100.7 fL — ABNORMAL HIGH (ref 80.0–100.0)
Platelets: 200 K/uL (ref 150–400)
RBC: 2.71 MIL/uL — ABNORMAL LOW (ref 4.22–5.81)
RDW: 14.1 % (ref 11.5–15.5)
WBC: 9.7 K/uL (ref 4.0–10.5)
nRBC: 0 % (ref 0.0–0.2)

## 2023-10-20 LAB — BASIC METABOLIC PANEL WITH GFR
Anion gap: 6 (ref 5–15)
BUN: 31 mg/dL — ABNORMAL HIGH (ref 8–23)
CO2: 24 mmol/L (ref 22–32)
Calcium: 8.5 mg/dL — ABNORMAL LOW (ref 8.9–10.3)
Chloride: 106 mmol/L (ref 98–111)
Creatinine, Ser: 1.77 mg/dL — ABNORMAL HIGH (ref 0.61–1.24)
GFR, Estimated: 38 mL/min — ABNORMAL LOW (ref >60)
Glucose, Bld: 146 mg/dL — ABNORMAL HIGH (ref 70–99)
Potassium: 4.6 mmol/L (ref 3.5–5.1)
Sodium: 136 mmol/L (ref 135–145)

## 2023-10-20 NOTE — Plan of Care (Signed)

## 2023-10-20 NOTE — Progress Notes (Signed)
 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 He is able to complete Sit to Stand without physical assist, but requires 2 attempts due to chair height and pt height. He is able to initiate steps and completes 10 steps using step to technique with railing per home setup. He elects to continue amb in hallway before returning to his room, completes 159ft amb and 10 steps. HEP reviewed and provided to pt for home completion. No further questions, comments, or concerns at this time.     If plan is discharge home, recommend the following: A little help with walking and/or transfers;A little help with bathing/dressing/bathroom;Assistance with cooking/housework;Help with stairs or ramp for entrance;Assist for transportation   Can travel by private vehicle        Equipment Recommendations  Rolling walker (2 wheels) (delivered)    Recommendations for Other Services       Precautions / Restrictions Precautions Precautions: Fall Restrictions Weight Bearing Restrictions Per Provider Order: Yes RLE Weight Bearing Per Provider Order: Weight bearing as tolerated     Mobility  Bed Mobility Overal bed mobility: Modified Independent             General bed mobility comments: inc time, no cues or physical assist required    Transfers Overall transfer level: Needs assistance Equipment used: Rolling walker (2 wheels) Transfers: Sit to/from Stand Sit to Stand: Supervision           General transfer comment: inc forward lean, and bias to LLE for power during transfer, requires 2 attempts but is able to complete with good control throughout    Ambulation/Gait Ambulation/Gait assistance: Contact guard assist Gait Distance (Feet): 100 Feet Assistive device:  Rolling walker (2 wheels) Gait Pattern/deviations: Step-to pattern, Antalgic, Decreased step length - left, Decreased stance time - right Gait velocity: dec     General Gait Details: Pt completes step to pattern with normalizing BOS, uses RW appropritately and is able to complete reciporcal pattern with continuous steps   Stairs Stairs: Yes Stairs assistance: Supervision Stair Management: One rail Right, Step to pattern Number of Stairs: 10 General stair comments: Pt able to complete steps with step to pattern, cued for sequence, well executed, good control, no instability noted   Wheelchair Mobility     Tilt Bed    Modified Rankin (Stroke Patients Only)       Balance Overall balance assessment: Needs assistance Sitting-balance support: Feet supported Sitting balance-Leahy Scale: Good     Standing balance support: Reliant on assistive device for balance, Single extremity supported Standing balance-Leahy Scale: Fair                              Hotel manager: Impaired Factors Affecting Communication: Hearing impaired  Cognition Arousal: Alert Behavior During Therapy: WFL for tasks assessed/performed   PT - Cognitive impairments: No apparent impairments                         Following commands: Intact      Cueing Cueing Techniques: Verbal cues, Gestural cues, Tactile cues  Exercises Total Joint Exercises Ankle Circles/Pumps: AROM, 5 reps, Both Quad Sets: AROM, 5 reps, Right Heel Slides: AAROM,  5 reps, Right Long Arc Quad: AROM, 5 reps, Right Knee Flexion: AROM, 5 reps, Right    General Comments        Pertinent Vitals/Pain Pain Assessment Pain Assessment: No/denies pain Faces Pain Scale: Hurts little more Pain Location: R knee Pain Descriptors / Indicators: Aching, Operative site guarding, Discomfort, Guarding Pain Intervention(s): Limited activity within patient's tolerance, Monitored during  session, Repositioned    Home Living                          Prior Function            PT Goals (current goals can now be found in the care plan section) Acute Rehab PT Goals Patient Stated Goal: return home PT Goal Formulation: With patient Time For Goal Achievement: 11/03/23 Potential to Achieve Goals: Good Progress towards PT goals: Progressing toward goals    Frequency    7X/week      PT Plan      Co-evaluation              AM-PAC PT 6 Clicks Mobility   Outcome Measure  Help needed turning from your back to your side while in a flat bed without using bedrails?: None Help needed moving from lying on your back to sitting on the side of a flat bed without using bedrails?: None Help needed moving to and from a bed to a chair (including a wheelchair)?: A Little Help needed standing up from a chair using your arms (e.g., wheelchair or bedside chair)?: A Little Help needed to walk in hospital room?: A Little Help needed climbing 3-5 steps with a railing? : A Little 6 Click Score: 20    End of Session Equipment Utilized During Treatment: Gait belt Activity Tolerance: Patient tolerated treatment well Patient left: in chair;with call bell/phone within reach;with family/visitor present;with nursing/sitter in room Nurse Communication: Mobility status PT Visit Diagnosis: Difficulty in walking, not elsewhere classified (R26.2);Muscle weakness (generalized) (M62.81);Unsteadiness on feet (R26.81)     Time: 8496-8471 PT Time Calculation (min) (ACUTE ONLY): 25 min  Charges:    $Gait Training: 8-22 mins $Therapeutic Exercise: 8-22 mins PT General Charges $$ ACUTE PT VISIT: 1 Visit                     Mike Roach, PT Acute Rehabilitation Services Office: [phone] 10/20/2023    Mike Roach Mike Roach 10/20/2023, 4:04 PM

## 2023-10-20 NOTE — Care Management Obs Status (Signed)
 MEDICARE OBSERVATION STATUS NOTIFICATION   Patient Details  Name: Mike Roach MRN: [id number] Date of Birth: 02-26-1941   Medicare Observation Status Notification Given:  Chaney NORMAN ASPEN, LCSW 10/20/2023, 1:58 PM

## 2023-10-20 NOTE — TOC Transition Note (Signed)
 Transition of Care [facility]) - Discharge Note   Patient Details  Name: Mike Roach MRN: [id number] Date of Birth: 1940/06/02  [facility]) CM/SW Contact:  NORMAN ASPEN, LCSW Phone Number: 10/20/2023, 10:19 AM   Clinical Narrative:     Met with pt who confirms need for a RW and no DME agency preference - order placed with [employer] and item delivered to room.  HHPT prearranged with [facility] via ortho MD office prior to surgery.  No further TOC needs.  Final next level of care: Home w Home Health Services Barriers to Discharge: No Barriers Identified   Patient Goals and CMS Choice Patient states their goals for this hospitalization and ongoing recovery are:: return home          Discharge Placement                       Discharge Plan and Services Additional resources added to the After Visit Summary for                  DME Arranged: Walker rolling DME Agency: [employer] Date DME Agency Contacted: 10/20/23 Time DME Agency Contacted: [id number] Representative spoke with at DME Agency: [facility] Arranged: PT HH Agency: [facility]        Social Drivers of Health (SDOH) Interventions SDOH Screenings   Food Insecurity: No Food Insecurity (10/19/2023)  Housing: Low Risk  (10/19/2023)  Transportation Needs: No Transportation Needs (10/19/2023)  Utilities: Not At Risk (10/19/2023)  Financial Resource Strain: Low Risk  (05/12/2023)   Received from [facility]  Physical Activity: Sufficiently Active (10/08/2022)   Received from [facility]  Social Connections: Moderately Integrated (10/19/2023)  Stress: No Stress Concern Present (05/12/2023)   Received from [facility]  Tobacco Use: Medium Risk (10/19/2023)  Health Literacy: Low Risk  (05/12/2023)   Received from [facility]     Readmission Risk Interventions     No data to display

## 2023-10-20 NOTE — Evaluation (Signed)
 Physical Therapy Evaluation Patient Details Name: Mike Roach MRN: [id number] DOB: [birth year] Today's Date: 10/20/2023  History of Present Illness  Pt is a 83 year old male s/p R TKA 10/19/23  Clinical Impression  Pt is s/p TKA resulting in the deficits listed below (see PT Problem List). Pt in bed, agreeable to PT session. He is able to complete supine to sit at mod I with inc time, Sit to Stand at [facility] for stability and tolerance with weight bearing. Pt completes 64ft amb with RW at [facility]-supervision A including directional changes, returns to recliner for lunch. Pt will benefit from acute skilled PT to increase their independence and safety with mobility to allow discharge.          If plan is discharge home, recommend the following: A little help with walking and/or transfers;A little help with bathing/dressing/bathroom;Assistance with cooking/housework;Help with stairs or ramp for entrance;Assist for transportation   Can travel by private vehicle        Equipment Recommendations Rolling walker (2 wheels) (delivered)  Recommendations for Other Services       Functional Status Assessment Patient has had a recent decline in their functional status and demonstrates the ability to make significant improvements in function in a reasonable and predictable amount of time.     Precautions / Restrictions Precautions Precautions: Fall Recall of Precautions/Restrictions: Intact Restrictions Weight Bearing Restrictions Per Provider Order: Yes RLE Weight Bearing Per Provider Order: Weight bearing as tolerated      Mobility  Bed Mobility Overal bed mobility: Modified Independent             General bed mobility comments: inc time, no cues or physical assist required    Transfers Overall transfer level: Needs assistance Equipment used: Rolling walker (2 wheels) Transfers: Sit to/from Stand Sit to Stand: Contact guard assist           General transfer comment: inc forward  lean, and bias to LLE for power during transfer    Ambulation/Gait Ambulation/Gait assistance: Contact guard assist Gait Distance (Feet): 80 Feet Assistive device: Rolling walker (2 wheels) Gait Pattern/deviations: Step-to pattern, Wide base of support, Antalgic, Decreased step length - left, Decreased stance time - right Gait velocity: dec     General Gait Details: Pt RW delivered to room, sized accordingly to pt and he is able to complete 80 ft with CGA-supA with level surfaces, inc UE support on AD  Stairs            Wheelchair Mobility     Tilt Bed    Modified Rankin (Stroke Patients Only)       Balance Overall balance assessment: Needs assistance Sitting-balance support: Feet supported Sitting balance-Leahy Scale: Good     Standing balance support: Reliant on assistive device for balance, Single extremity supported Standing balance-Leahy Scale: Fair                               Pertinent Vitals/Pain Pain Assessment Pain Assessment: Faces Faces Pain Scale: Hurts little more Pain Location: R knee Pain Descriptors / Indicators: Aching, Operative site guarding, Discomfort, Guarding Pain Intervention(s): Limited activity within patient's tolerance, Monitored during session, Repositioned    Home Living Family/patient expects to be discharged to:: Private residence Living Arrangements: Spouse/significant other Available Help at Discharge: Family Type of Home: House Home Access: Stairs to enter Entrance Stairs-Rails: Lawyer of Steps: 4   Home Layout: One level Home Equipment: Rolling  Walker (2 wheels) (delivered this stay)      Prior Function Prior Level of Function : Independent/Modified Independent                     Extremity/Trunk Assessment   Upper Extremity Assessment Upper Extremity Assessment: Overall WFL for tasks assessed    Lower Extremity Assessment Lower Extremity Assessment: Generalized  weakness;RLE deficits/detail RLE Deficits / Details: limited ROM and activity tolerance consistent with post operative status    Cervical / Trunk Assessment Cervical / Trunk Assessment: Normal  Communication   Communication Communication: Impaired Factors Affecting Communication: Hearing impaired    Cognition Arousal: Alert Behavior During Therapy: WFL for tasks assessed/performed   PT - Cognitive impairments: No apparent impairments                         Following commands: Intact       Cueing Cueing Techniques: Verbal cues, Gestural cues, Tactile cues     General Comments      Exercises     Assessment/Plan    PT Assessment Patient needs continued PT services  PT Problem List Decreased strength;Decreased balance;Decreased range of motion;Decreased mobility;Decreased activity tolerance;Pain       PT Treatment Interventions DME instruction;Functional mobility training;Balance training;Patient/family education;Gait training;Therapeutic activities;Stair training;Therapeutic exercise    PT Goals (Current goals can be found in the Care Plan section)  Acute Rehab PT Goals Patient Stated Goal: return home PT Goal Formulation: With patient Time For Goal Achievement: 11/03/23 Potential to Achieve Goals: Good    Frequency 7X/week     Co-evaluation               AM-PAC PT 6 Clicks Mobility  Outcome Measure Help needed turning from your back to your side while in a flat bed without using bedrails?: None Help needed moving from lying on your back to sitting on the side of a flat bed without using bedrails?: None Help needed moving to and from a bed to a chair (including a wheelchair)?: A Little Help needed standing up from a chair using your arms (e.g., wheelchair or bedside chair)?: A Little Help needed to walk in hospital room?: A Little Help needed climbing 3-5 steps with a railing? : A Lot 6 Click Score: 19    End of Session Equipment Utilized  During Treatment: Gait belt Activity Tolerance: Patient tolerated treatment well Patient left: in chair;with call bell/phone within reach;with family/visitor present Nurse Communication: Mobility status PT Visit Diagnosis: Difficulty in walking, not elsewhere classified (R26.2);Muscle weakness (generalized) (M62.81);Unsteadiness on feet (R26.81)    Time: 8889-8870 PT Time Calculation (min) (ACUTE ONLY): 19 min   Charges:   PT Evaluation $PT Eval Low Complexity: 1 Low   PT General Charges $$ ACUTE PT VISIT: 1 Visit         Stann, PT Acute Rehabilitation Services Office: [phone] 10/20/2023   Stann [name] 10/20/2023, 11:36 AM

## 2023-10-20 NOTE — Progress Notes (Signed)
     Subjective:  Patient reports pain as mild.  Doing well this morning.  Denies distal numbness and tingling.  Discussed plan for mobilization with physical therapy today.  Yesterday's total administered Morphine Milligram Equivalents: 22.5   Objective:   VITALS:   Vitals:   10/19/23 1751 10/19/23 2245 10/20/23 0223 10/20/23 0647  BP: 138/75 123/78 115/68 110/71  Pulse: 69 69 77 66  Resp: 16 18 18 18   Temp: 97.8 F (36.6 C) 98.7 F (37.1 C) 98.1 F (36.7 C) 98.6 F (37 C)  TempSrc:  Oral Oral Oral  SpO2: 97% 100% 99% 99%  Weight:      Height: 6' 2 (1.88 m)       Sensation intact distally Intact pulses distally Dorsiflexion/Plantar flexion intact Incision: dressing C/D/I Compartment soft    Lab Results  Component Value Date   WBC 9.7 10/20/2023   HGB 8.9 (L) 10/20/2023   HCT 27.3 (L) 10/20/2023   MCV 100.7 (H) 10/20/2023   PLT 200 10/20/2023   BMET    Component Value Date/Time   NA 136 10/20/2023 0334   NA 139 05/03/2019 0854   NA 140 10/16/2016 0816   K 4.6 10/20/2023 0334   K 4.1 10/16/2016 0816   CL 106 10/20/2023 0334   CO2 24 10/20/2023 0334   CO2 24 10/16/2016 0816   GLUCOSE 146 (H) 10/20/2023 0334   GLUCOSE 96 10/16/2016 0816   BUN 31 (H) 10/20/2023 0334   BUN 21 05/03/2019 0854   BUN 17.4 10/16/2016 0816   CREATININE 1.77 (H) 10/20/2023 0334   CREATININE 1.51 (H) 01/08/2023 1026   CREATININE 1.8 (H) 10/16/2016 0816   CALCIUM  8.5 (L) 10/20/2023 0334   CALCIUM  9.6 10/16/2016 0816   EGFR 42 (L) 10/16/2016 0816   GFRNONAA 38 (L) 10/20/2023 0334   GFRNONAA 46 (L) 01/08/2023 1026      Xray: TKA components in good position, no adverse features  Assessment/Plan: 1 Day Post-Op   Principal Problem:   Primary osteoarthritis of right knee  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 Edna at  [facility].  Address: [address], [place], [place] [address]  Office Phone: [phone]   Mike Roach 10/20/2023, 7:00 AM   Toribio Edna, MD  Contact information:   [phone] 7am-5pm epic message Dr. Edna, or call office for patient follow up: [phone] After hours and holidays please check [email or web address] for group call information for [facility]

## 2023-10-20 NOTE — Discharge Summary (Signed)
 Physician Discharge Summary  Patient ID: Mike Roach MRN: [id number] DOB/AGE: 1940/10/12 83 y.o.  Admit date: 10/19/2023 Discharge date: 10/20/2023  Admission Diagnoses:  Primary osteoarthritis of right knee  Discharge Diagnoses:  Principal Problem:   Primary osteoarthritis of right knee   Past Medical History:  Diagnosis Date   Adenomatous colon polyp 09/21/2014   Anemia in chronic illness 03/07/2015   being evaluated   Arthritis    Atrial flutter (HCC)    Bilateral hearing loss 03/15/2015   Cancer of intestine (HCC)    Cataract    Chronic kidney disease (CKD) 03/07/2015   Chronic kidney disease, stage III (moderate) (HCC) 03/07/2015   Chronic right shoulder pain 02/03/2018   Coronary artery disease    Dysrhythmia    GERD (gastroesophageal reflux disease)    GIST (gastrointestinal stroma tumor), malignant, colon (HCC) dx'd 2017   Duodenom,    Glaucoma    both eyes   Glaucoma    Hearing impairment    Hypercholesterolemia    Hypertension    Iron deficiency anemia 03/15/2015   Kidney lesion, native, left 07/12/2015   Lateral meniscus derangement 01/12/2013   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   Osteoarthritis of right knee 01/12/2013   Pericardial effusion    Prostate enlargement    Renal cancer, left (HCC) dx'd 2017   Skin lesion of left leg 03/07/2015    Surgeries: Procedure(s): ARTHROPLASTY, KNEE, TOTAL on 10/19/2023   Consultants (if any):   Discharged Condition: Improved  Hospital Course: Mike Roach is an 83 y.o. male who was admitted 10/19/2023 with a diagnosis of Primary osteoarthritis of right knee and went to the operating room on 10/19/2023 and underwent the above named procedures.    He was given perioperative antibiotics:  Anti-infectives (From admission, onward)    Start     Dose/Rate Route Frequency Ordered Stop   10/19/23 2000  ceFAZolin  (ANCEF ) IVPB 2g/100 mL premix        2 g 200 mL/hr over 30 Minutes Intravenous Every 6 hours  10/19/23 1738 10/20/23 0322   10/19/23 1015  ceFAZolin  (ANCEF ) IVPB 2g/100 mL premix        2 g 200 mL/hr over 30 Minutes Intravenous On call to O.R. 10/19/23 1007 10/19/23 1346     .  He was given sequential compression devices, early ambulation, and eliquis  for DVT prophylaxis.  He benefited maximally from the hospital stay and there were no complications.    Recent vital signs:  Vitals:   10/20/23 0959 10/20/23 1333  BP: 111/60 120/62  Pulse: (!) 53 (!) 109  Resp: 16 18  Temp: 97.9 F (36.6 C) 98.1 F (36.7 C)  SpO2: 95% 96%    Recent laboratory studies:  Lab Results  Component Value Date   HGB 8.9 (L) 10/20/2023   HGB 10.3 (L) 10/02/2023   HGB 10.4 (L) 01/08/2023   Lab Results  Component Value Date   WBC 9.7 10/20/2023   PLT 200 10/20/2023   No results found for: INR Lab Results  Component Value Date   NA 136 10/20/2023   K 4.6 10/20/2023   CL 106 10/20/2023   CO2 24 10/20/2023   BUN 31 (H) 10/20/2023   CREATININE 1.77 (H) 10/20/2023   GLUCOSE 146 (H) 10/20/2023    Discharge Medications:   Allergies as of 10/20/2023   No Known Allergies      Medication List     STOP taking these medications    aspirin  EC 81 MG tablet  TAKE these medications    acetaminophen  500 MG tablet Commonly known as: TYLENOL  Take 2 tablets (1,000 mg total) by mouth every 8 (eight) hours as needed.   apixaban  2.5 MG Tabs tablet Commonly known as: Eliquis  Take 1 tablet (2.5 mg total) by mouth 2 (two) times daily.   artificial tears ophthalmic solution Place into both eyes daily as needed for dry eyes.   ascorbic acid 500 MG tablet Commonly known as: VITAMIN C Take 500 mg by mouth daily.   finasteride  5 MG tablet Commonly known as: PROSCAR  Take 1 tablet (5 mg total) by mouth every evening.   Fish Oil 1200 MG Caps Take 1,200 mg by mouth daily.   hydrocortisone 2.5 % rectal cream Commonly known as: ANUSOL-HC Place 1 Application rectally daily as  needed for hemorrhoids or anal itching.   lisinopril  20 MG tablet Commonly known as: ZESTRIL  Take 20 mg by mouth every morning.   methocarbamol  500 MG tablet Commonly known as: ROBAXIN  Take 1 tablet (500 mg total) by mouth every 8 (eight) hours as needed for up to 10 days for muscle spasms.   multivitamins ther. w/minerals Tabs tablet Take 1 tablet by mouth daily.   nitroGLYCERIN  0.4 MG SL tablet Commonly known as: NITROSTAT  Place 1 tablet (0.4 mg total) under the tongue every 5 (five) minutes as needed for chest pain.   omeprazole 20 MG capsule Commonly known as: PRILOSEC Take 20 mg by mouth daily.   ondansetron  4 MG tablet Commonly known as: Zofran  Take 1 tablet (4 mg total) by mouth every 8 (eight) hours as needed for up to 14 days for nausea or vomiting.   oxyCODONE  5 MG immediate release tablet Commonly known as: Roxicodone  Take 1 tablet (5 mg total) by mouth every 4 (four) hours as needed for up to 7 days for severe pain (pain score 7-10) or moderate pain (pain score 4-6).   polyethylene glycol 17 g packet Commonly known as: MiraLax  Take 17 g by mouth daily.   pyridOXINE 100 MG tablet Commonly known as: VITAMIN B6 Take 100 mg by mouth daily.   rosuvastatin  10 MG tablet Commonly known as: CRESTOR  Take 1 tablet (10 mg total) by mouth daily. Please schedule yearly appointment for future refills. Thank you   vitamin B-12 500 MCG tablet Commonly known as: CYANOCOBALAMIN Take 500 mcg by mouth daily.   Vitamin D3 10 MCG (400 UNIT) tablet Take 400 Units by mouth daily.        Diagnostic Studies: DG Knee Right Port Result Date: 10/19/2023 CLINICAL DATA:  [id number] Post-operative state 252351 EXAM: PORTABLE RIGHT KNEE - 1-2 VIEW COMPARISON:  April 10, 2023 FINDINGS: Osteopenia. Well-aligned knee arthroplasty with patellar resurfacing. No acute fracture.No dislocation. Subcutaneous edema and gas present, an expected finding at this stage in the postoperative recovery.  No radiopaque foreign body or retained surgical instrument. Peripheral vascular atherosclerosis. IMPRESSION: Well-aligned knee arthroplasty without acute fracture or dislocation.No radiopaque foreign body or retained surgical instrument. Electronically Signed   By: Rogelia Myers M.D.   On: 10/19/2023 17:31    Disposition: Discharge disposition: 01-Home or Self Care       Discharge Instructions     Call MD / Call 911   Complete by: As directed    If you experience chest pain or shortness of breath, CALL 911 and be transported to the hospital emergency room.  If you develope a fever above 101 F, pus (white drainage) or increased drainage or redness at the wound, or calf  pain, call your surgeon's office.   Constipation Prevention   Complete by: As directed    Drink plenty of fluids.  Prune juice may be helpful.  You may use a stool softener, such as Colace (over the counter) 100 mg twice a day.  Use MiraLax  (over the counter) for constipation as needed.   Diet - low sodium heart healthy   Complete by: As directed    Increase activity slowly as tolerated   Complete by: As directed    Post-operative opioid taper instructions:   Complete by: As directed    POST-OPERATIVE OPIOID TAPER INSTRUCTIONS: It is important to wean off of your opioid medication as soon as possible. If you do not need pain medication after your surgery it is ok to stop day one. Opioids include: Codeine, Hydrocodone (Norco, Vicodin), Oxycodone (Percocet, oxycontin ) and hydromorphone  amongst others.  Long term and even short term use of opiods can cause: Increased pain response Dependence Constipation Depression Respiratory depression And more.  Withdrawal symptoms can include Flu like symptoms Nausea, vomiting And more Techniques to manage these symptoms Hydrate well Eat regular healthy meals Stay active Use relaxation techniques(deep breathing, meditating, yoga) Do Not substitute Alcohol  to help with  tapering If you have been on opioids for less than two weeks and do not have pain than it is ok to stop all together.  Plan to wean off of opioids This plan should start within one week post op of your joint replacement. Maintain the same interval or time between taking each dose and first decrease the dose.  Cut the total daily intake of opioids by one tablet each day Next start to increase the time between doses. The last dose that should be eliminated is the evening dose.           Follow-up Information     Edna Toribio LABOR, MD Follow up in 2 week(s).   Specialty: Orthopedic Surgery Contact information: [address] [place] [address] [phone]         [facility] Follow up.   Specialty: Home Health Services Why: to provide home physical therapy visits Contact information: [address] [place] [address] [phone]                    Discharge Instructions      INSTRUCTIONS AFTER JOINT REPLACEMENT   Remove items at home which could result in a fall. This includes throw rugs or furniture in walking pathways ICE to the affected joint every three hours while awake for 30 minutes at a time, for at least the first 3-5 days, and then as needed for pain and swelling.  Continue to use ice for pain and swelling. You may notice swelling that will progress down to the foot and ankle.  This is normal after surgery.  Elevate your leg when you are not up walking on it.   Continue to use the breathing machine you got in the hospital (incentive spirometer) which will help keep your temperature down.  It is common for your temperature to cycle up and down following surgery, especially at night when you are not up moving around and exerting yourself.  The breathing machine keeps your lungs expanded and your temperature down.  DIET:  As you were doing prior to hospitalization, we recommend a well-balanced diet.  DRESSING / WOUND  CARE / SHOWERING:  Keep the surgical dressing until follow up.  The dressing is water proof, so  you can shower without any extra covering.  IF THE DRESSING FALLS OFF or the wound gets wet inside, change the dressing with sterile gauze.  Please use good hand washing techniques before changing the dressing.  Do not use any lotions or creams on the incision until instructed by your surgeon.    ACTIVITY  Increase activity slowly as tolerated, but follow the weight bearing instructions below.   No driving for 6 weeks or until further direction given by your physician.  You cannot drive while taking narcotics.  No lifting or carrying greater than 10 lbs. until further directed by your surgeon. Avoid periods of inactivity such as sitting longer than an hour when not asleep. This helps prevent blood clots.  You may return to work once you are authorized by your doctor.   WEIGHT BEARING: Weight bearing as tolerated with assist device (walker, cane, etc) as directed, use it as long as suggested by your surgeon or therapist, typically at least 4-6 weeks.  EXERCISES  Results after joint replacement surgery are often greatly improved when you follow the exercise, range of motion and muscle strengthening exercises prescribed by your doctor. Safety measures are also important to protect the joint from further injury. Any time any of these exercises cause you to have increased pain or swelling, decrease what you are doing until you are comfortable again and then slowly increase them. If you have problems or questions, call your caregiver or physical therapist for advice.   Rehabilitation is important following a joint replacement. After just a few days of immobilization, the muscles of the leg can become weakened and shrink (atrophy).  These exercises are designed to build up the tone and strength of the thigh and leg muscles and to improve motion. Often times heat used for twenty to thirty minutes before working  out will loosen up your tissues and help with improving the range of motion but do not use heat for the first two weeks following surgery (sometimes heat can increase post-operative swelling).   These exercises can be done on a training (exercise) mat, on the floor, on a table or on a bed. Use whatever works the best and is most comfortable for you.    Use music or television while you are exercising so that the exercises are a pleasant break in your day. This will make your life better with the exercises acting as a break in your routine that you can look forward to.   Perform all exercises about fifteen times, three times per day or as directed.  You should exercise both the operative leg and the other leg as well.  Exercises include:   Quad Sets - Tighten up the muscle on the front of the thigh (Quad) and hold for 5-10 seconds.   Straight Leg Raises - With your knee straight (if you were given a brace, keep it on), lift the leg to 60 degrees, hold for 3 seconds, and slowly lower the leg.  Perform this exercise against resistance later as your leg gets stronger.  Leg Slides: Lying on your back, slowly slide your foot toward your buttocks, bending your knee up off the floor (only go as far as is comfortable). Then slowly slide your foot back down until your leg is flat on the floor again.  Angel Wings: Lying on your back spread your legs to the side as far apart as you can without causing discomfort.  Hamstring Strength:  Lying on your back, push your heel against  the floor with your leg straight by tightening up the muscles of your buttocks.  Repeat, but this time bend your knee to a comfortable angle, and push your heel against the floor.  You may put a pillow under the heel to make it more comfortable if necessary.   A rehabilitation program following joint replacement surgery can speed recovery and prevent re-injury in the future due to weakened muscles. Contact your doctor or a physical therapist  for more information on knee rehabilitation.   CONSTIPATION:  Constipation is defined medically as fewer than three stools per week and severe constipation as less than one stool per week.  Even if you have a regular bowel pattern at home, your normal regimen is likely to be disrupted due to multiple reasons following surgery.  Combination of anesthesia, postoperative narcotics, change in appetite and fluid intake all can affect your bowels.   YOU MUST use at least one of the following options; they are listed in order of increasing strength to get the job done.  They are all available over the counter, and you may need to use some, POSSIBLY even all of these options:    Drink plenty of fluids (prune juice may be helpful) and high fiber foods Colace 100 mg by mouth twice a day  Senokot for constipation as directed and as needed Dulcolax (bisacodyl ), take with full glass of water  Miralax  (polyethylene glycol) once or twice a day as needed.  If you have tried all these things and are unable to have a bowel movement in the first 3-4 days after surgery call either your surgeon or your primary doctor.    If you experience loose stools or diarrhea, hold the medications until you stool forms back up.  If your symptoms do not get better within 1 week or if they get worse, check with your doctor.  If you experience the worst abdominal pain ever or develop nausea or vomiting, please contact the office immediately for further recommendations for treatment.  ITCHING:  If you experience itching with your medications, try taking only a single pain pill, or even half a pain pill at a time.  You can also use Benadryl  over the counter for itching or also to help with sleep.   TED HOSE STOCKINGS:  Use stockings on both legs until for at least 2 weeks or as directed by physician office. They may be removed at night for sleeping.  MEDICATIONS:  See your medication summary on the "After Visit Summary" that nursing  will review with you.  You may have some home medications which will be placed on hold until you complete the course of blood thinner medication.  It is important for you to complete the blood thinner medication as prescribed.  Blood clot prevention (DVT Prophylaxis): After surgery you are at an increased risk for a blood clot.  You were prescribed a blood thinner, Eliquis  2.5mg , to be taken twice daily for a total of 4 weeks from surgery to help reduce your risk of getting a blood clot.  After finishing the Eliquis , you may return to taking your Aspirin .  Signs of a pulmonary embolus (blood clot in the lungs) include sudden short of breath, feeling lightheaded or dizzy, chest pain with a deep breath, rapid pulse rapid breathing.  Signs of a blood clot in your arms or legs include new unexplained swelling and cramping, warm, red or darkened skin around the painful area.  Please call the office or 911 right away  if these signs or symptoms develop.  PRECAUTIONS:   If you experience chest pain or shortness of breath - call 911 immediately for transfer to the hospital emergency department.   If you develop a fever greater that 101 F, purulent drainage from wound, increased redness or drainage from wound, foul odor from the wound/dressing, or calf pain - CONTACT YOUR SURGEON.                                                   FOLLOW-UP APPOINTMENTS:  If you do not already have a post-op appointment, please call the office for an appointment to be seen by your surgeon.  Guidelines for how soon to be seen are listed in your "After Visit Summary", but are typically between 2-3 weeks after surgery.  If you have a specialized bandage, you may be told to follow up 1 week after surgery.  OTHER INSTRUCTIONS:  Knee Replacement:  Do not place pillow under knee, focus on keeping the knee straight while resting.  Place foam block, curve side up under heel at all times except when walking.  DO NOT modify, tear, cut, or  change the foam block in any way.  POST-OPERATIVE OPIOID TAPER INSTRUCTIONS: It is important to wean off of your opioid medication as soon as possible. If you do not need pain medication after your surgery it is ok to stop day one. Opioids include: Codeine, Hydrocodone (Norco, Vicodin), Oxycodone (Percocet, oxycontin ) and hydromorphone  amongst others.  Long term and even short term use of opiods can cause: Increased pain response Dependence Constipation Depression Respiratory depression And more.  Withdrawal symptoms can include Flu like symptoms Nausea, vomiting And more Techniques to manage these symptoms Hydrate well Eat regular healthy meals Stay active Use relaxation techniques(deep breathing, meditating, yoga) Do Not substitute Alcohol  to help with tapering If you have been on opioids for less than two weeks and do not have pain than it is ok to stop all together.  Plan to wean off of opioids This plan should start within one week post op of your joint replacement. Maintain the same interval or time between taking each dose and first decrease the dose.  Cut the total daily intake of opioids by one tablet each day Next start to increase the time between doses. The last dose that should be eliminated is the evening dose.   MAKE SURE YOU:  Understand these instructions.  Get help right away if you are not doing well or get worse.    Thank you for letting us  be a part of your medical care team.  It is a privilege we respect greatly.  We hope these instructions will help you stay on track for a fast and full recovery!   +++++++++++++++++++++++++++++++++++++++++++++++++++++++++++++++++++  Information on my medicine - ELIQUIS  (apixaban )  This medication education was reviewed with me or my healthcare representative as part of my discharge preparation.  Why was Eliquis  prescribed for you? Eliquis  was prescribed for you to reduce the risk of blood clots forming after  orthopedic surgery.    What do You need to know about Eliquis ? Take your Eliquis  TWICE DAILY - one tablet in the morning and one tablet in the evening with or without food.  It would be best to take the dose about the same time each day.  If you have difficulty swallowing the  tablet whole please discuss with your pharmacist how to take the medication safely.  Take Eliquis  exactly as prescribed by your doctor and DO NOT stop taking Eliquis  without talking to the doctor who prescribed the medication.  Stopping without other medication to take the place of Eliquis  may increase your risk of developing a clot.  After discharge, you should have regular check-up appointments with your healthcare provider that is prescribing your Eliquis .  What do you do if you miss a dose? If a dose of ELIQUIS  is not taken at the scheduled time, take it as soon as possible on the same day and twice-daily administration should be resumed.  The dose should not be doubled to make up for a missed dose.  Do not take more than one tablet of ELIQUIS  at the same time.  Important Safety Information A possible side effect of Eliquis  is bleeding. You should call your healthcare provider right away if you experience any of the following: Bleeding from an injury or your nose that does not stop. Unusual colored urine (red or dark brown) or unusual colored stools (red or black). Unusual bruising for unknown reasons. A serious fall or if you hit your head (even if there is no bleeding).  Some medicines may interact with Eliquis  and might increase your risk of bleeding or clotting while on Eliquis . To help avoid this, consult your healthcare provider or pharmacist prior to using any new prescription or non-prescription medications, including herbals, vitamins, non-steroidal anti-inflammatory drugs (NSAIDs) and supplements.  This website has more information on Eliquis  (apixaban ): http://[email or web address] .com/eliquis dena            Signed: Kyndle Schlender A Beverlee Wilmarth 10/20/2023, 3:35 PM

## 2023-10-20 NOTE — Telephone Encounter (Signed)
 Patient Product/process development scientist completed.    The patient is insured through [employer]. Patient has Medicare and is not eligible for a copay card, but may be able to apply for patient assistance or Medicare RX Payment Plan (Patient Must reach out to their plan, if eligible for payment plan), if available.    Ran test claim for Eliquis  5 mg and the current 30 day co-pay is $25.00.   This test claim was processed through [facility]- copay amounts may vary at other pharmacies due to pharmacy/plan contracts, or as the patient moves through the different stages of their insurance plan.     Reyes Sharps, CPHT Pharmacy Technician III Certified Patient Advocate [facility] Patient Advocate Team Direct Number: [phone]  Fax: [phone]

## 2023-11-09 ENCOUNTER — Ambulatory Visit (HOSPITAL_COMMUNITY)

## 2023-11-13 ENCOUNTER — Encounter (HOSPITAL_COMMUNITY)

## 2023-11-17 ENCOUNTER — Encounter (HOSPITAL_COMMUNITY)

## 2023-11-20 ENCOUNTER — Encounter (HOSPITAL_COMMUNITY)

## 2023-11-24 ENCOUNTER — Encounter (HOSPITAL_COMMUNITY)

## 2023-11-27 ENCOUNTER — Encounter (HOSPITAL_COMMUNITY)

## 2023-12-01 ENCOUNTER — Encounter (HOSPITAL_COMMUNITY)

## 2023-12-04 ENCOUNTER — Encounter (HOSPITAL_COMMUNITY)

## 2023-12-07 ENCOUNTER — Encounter (HOSPITAL_COMMUNITY)

## 2023-12-10 ENCOUNTER — Encounter (HOSPITAL_COMMUNITY)

## 2023-12-14 ENCOUNTER — Encounter (HOSPITAL_COMMUNITY)

## 2023-12-17 ENCOUNTER — Encounter (HOSPITAL_COMMUNITY)

## 2023-12-21 ENCOUNTER — Encounter (HOSPITAL_COMMUNITY)

## 2023-12-21 NOTE — Progress Notes (Signed)
 Impression/Assessment:  History of elevated PSA with negative biopsies x3.  PSA not being checked anymore.  Exam today benign   BPH-he is doing well with finasteride    Plan:      History of Present Illness: Patient here for follow-up of elevated PSA, erectile dysfunction and BPH.     Prior h/o elevated PSA with negative BX x 3 previously most recently 2006 at time TRUS 63mL. PSA peak of 14.  2012 PSA 4.2 (avodart)  2017 PSA 4.4 (avodart) age 83  11.15.2017--3.0  11.12.2018--2.6 7.30.2019: PSA 2.4.  7.21.2020:  PSA 2.5.  7.1.2021--2.2 (finasteride  correction 4.4) 9.6.2022: PSA 3.4 (correction 6.8) 10.10.2023:  2.4 (corrected value 4.8) IPSS 7  QoL score 2   2.26.2019--he is here today for reintroduction to prostaglandin injections.    7.30.2019: 30 mcg doses cause pain--he would like to decrease dose    7.21.2020: He reports mild satisfaction with injection use thus far, but has had some difficulties with sex still. He expresses that he wants to continue with this treatment.   He is followed @ [facility] by Dr. Alvaro for history of renal cell carcinoma.  He underwent left partial nephrectomy in August 2017.  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  He has small right renal masses that are being followed with active surveillance.  His next office visit with Dr. Patrcia will be in about 3 months.   10.21.2025:   Past Medical History:  Diagnosis Date   Adenomatous colon polyp 09/21/2014   Anemia in chronic illness 03/07/2015   being evaluated   Arthritis    Atrial flutter (HCC)    Bilateral hearing loss 03/15/2015   Cancer of intestine (HCC)    Cataract    Chronic kidney disease (CKD) 03/07/2015   Chronic kidney disease, stage III (moderate) (HCC) 03/07/2015   Chronic right shoulder pain 02/03/2018   Coronary artery disease    Dysrhythmia    GERD (gastroesophageal reflux disease)    GIST (gastrointestinal stroma tumor), malignant, colon  (HCC) dx'd 2017   Duodenom,    Glaucoma    both eyes   Glaucoma    Hearing impairment    Hypercholesterolemia    Hypertension    Iron deficiency anemia 03/15/2015   Kidney lesion, native, left 07/12/2015   Lateral meniscus derangement 01/12/2013   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   Osteoarthritis of right knee 01/12/2013   Pericardial effusion    Prostate enlargement    Renal cancer, left (HCC) dx'd 2017   Skin lesion of left leg 03/07/2015    Past Surgical History:  Procedure Laterality Date   colonoscopy     COLONOSCOPY     ESOPHAGOGASTRODUODENOSCOPY (EGD) WITH PROPOFOL  N/A 06/08/2017   Procedure: ESOPHAGOGASTRODUODENOSCOPY (EGD) WITH PROPOFOL ;  Surgeon: Avram Lupita BRAVO, MD;  Location: [facility];  Service: Endoscopy;  Laterality: N/A;   EUS N/A 07/05/2015   Procedure: UPPER ENDOSCOPIC ULTRASOUND (EUS) LINEAR;  Surgeon: Toribio SHAUNNA Cedar, MD;  Location: [facility];  Service: Endoscopy;  Laterality: N/A;   EYE SURGERY     KNEE ARTHROSCOPY WITH LATERAL MENISECTOMY Right 01/21/2013   Procedure: KNEE ARTHROSCOPY WITH PARTIAL LATERAL MENISECTOMY;  Surgeon: Taft BRAVO Minerva, MD;  Location: [facility];  Service: Orthopedics;  Laterality: Right;   ORIF ANKLE DISLOCATION Right 15 yrs ago   ROBOTIC ASSITED PARTIAL NEPHRECTOMY Left 10/17/2015   Procedure: XI ROBOTIC ASSITED LEFT PARTIAL NEPHRECTOMY, Left Cyst Decortication, Left Renal Ultrasound;  Surgeon: Ricardo Alvaro, MD;  Location:  [facility];  Service: Urology;  Laterality: Left;   TOTAL KNEE ARTHROPLASTY Right 10/19/2023   Procedure: ARTHROPLASTY, KNEE, TOTAL;  Surgeon: Edna Toribio LABOR, MD;  Location: [facility];  Service: Orthopedics;  Laterality: Right;   UPPER GASTROINTESTINAL ENDOSCOPY      Home Medications:  Allergies as of 12/22/2023   No Known Allergies      Medication List        Accurate as of December 21, 2023  7:31 AM. If you have any questions, ask your nurse or doctor.          apixaban  2.5 MG Tabs  tablet Commonly known as: Eliquis  Take 1 tablet (2.5 mg total) by mouth 2 (two) times daily.   artificial tears ophthalmic solution Place into both eyes daily as needed for dry eyes.   ascorbic acid 500 MG tablet Commonly known as: VITAMIN C Take 500 mg by mouth daily.   finasteride  5 MG tablet Commonly known as: PROSCAR  Take 1 tablet (5 mg total) by mouth every evening.   Fish Oil 1200 MG Caps Take 1,200 mg by mouth daily.   hydrocortisone 2.5 % rectal cream Commonly known as: ANUSOL-HC Place 1 Application rectally daily as needed for hemorrhoids or anal itching.   lisinopril  20 MG tablet Commonly known as: ZESTRIL  Take 20 mg by mouth every morning.   multivitamins ther. w/minerals Tabs tablet Take 1 tablet by mouth daily.   nitroGLYCERIN  0.4 MG SL tablet Commonly known as: NITROSTAT  Place 1 tablet (0.4 mg total) under the tongue every 5 (five) minutes as needed for chest pain.   omeprazole 20 MG capsule Commonly known as: PRILOSEC Take 20 mg by mouth daily.   polyethylene glycol 17 g packet Commonly known as: MiraLax  Take 17 g by mouth daily.   pyridOXINE 100 MG tablet Commonly known as: VITAMIN B6 Take 100 mg by mouth daily.   rosuvastatin  10 MG tablet Commonly known as: CRESTOR  Take 1 tablet (10 mg total) by mouth daily. Please schedule yearly appointment for future refills. Thank you   vitamin B-12 500 MCG tablet Commonly known as: CYANOCOBALAMIN Take 500 mcg by mouth daily.   Vitamin D3 10 MCG (400 UNIT) tablet Take 400 Units by mouth daily.        Allergies: No Known Allergies  Family History  Problem Relation Age of Onset   Hypertension Father    Colon cancer Neg Hx    Esophageal cancer Neg Hx    Rectal cancer Neg Hx    Stomach cancer Neg Hx     Social History:  reports that he quit smoking about 42 years ago. His smoking use included cigarettes. He started smoking about 62 years ago. He has a 20 pack-year smoking history. He has never  used smokeless tobacco. He reports that he does not drink alcohol  and does not use drugs.  ROS: A complete review of systems was performed.  All systems are negative except for pertinent findings as noted.  Physical Exam:  Vital signs in last 24 hours: There were no vitals taken for this visit. Constitutional:  Alert and oriented, No acute distress Cardiovascular: Regular rate  Respiratory: Normal respiratory effort GU: Slightly lax anal sphincter tone.  Prostate 30 g, symmetric, nonnodular, nontender. Neurologic: Grossly intact, no focal deficits Psychiatric: Normal mood and affect  I have reviewed prior pt notes  I have reviewed notes from referring/previous physicians-[facility] notes reviewed  I have independently reviewed prior imaging-- prostate ultrasound  I have reviewed prior PSA and  pathology results  IPSS form reviewed   Impression/Assessment:  History of elevated PSA with negative biopsies x3.  PSA not being checked anymore.  Exam today benign  BPH-he is doing well with finasteride   Plan:  He will continue on finasteride   At his request, I will see him back yearly for recheck

## 2023-12-22 ENCOUNTER — Ambulatory Visit: Payer: Medicare HMO | Admitting: Urology

## 2023-12-22 ENCOUNTER — Encounter: Payer: Self-pay | Admitting: Urology

## 2023-12-22 VITALS — BP 131/68 | HR 73

## 2023-12-22 DIAGNOSIS — Z85528 Personal history of other malignant neoplasm of kidney: Secondary | ICD-10-CM

## 2023-12-22 DIAGNOSIS — N5201 Erectile dysfunction due to arterial insufficiency: Secondary | ICD-10-CM

## 2023-12-22 DIAGNOSIS — N401 Enlarged prostate with lower urinary tract symptoms: Secondary | ICD-10-CM

## 2023-12-22 DIAGNOSIS — N400 Enlarged prostate without lower urinary tract symptoms: Secondary | ICD-10-CM | POA: Diagnosis not present

## 2023-12-22 DIAGNOSIS — N2889 Other specified disorders of kidney and ureter: Secondary | ICD-10-CM

## 2023-12-22 DIAGNOSIS — Z87438 Personal history of other diseases of male genital organs: Secondary | ICD-10-CM | POA: Diagnosis not present

## 2023-12-22 DIAGNOSIS — R972 Elevated prostate specific antigen [PSA]: Secondary | ICD-10-CM

## 2023-12-24 ENCOUNTER — Encounter (HOSPITAL_COMMUNITY)

## 2024-01-02 ENCOUNTER — Encounter (HOSPITAL_COMMUNITY): Payer: Self-pay

## 2024-01-02 ENCOUNTER — Emergency Department (HOSPITAL_COMMUNITY)
Admission: EM | Admit: 2024-01-02 | Discharge: 2024-01-02 | Disposition: A | Discharge status: Home / Self Care | Attending: Emergency Medicine | Admitting: Emergency Medicine

## 2024-01-02 ENCOUNTER — Other Ambulatory Visit: Payer: Self-pay

## 2024-01-02 DIAGNOSIS — Z7901 Long term (current) use of anticoagulants: Secondary | ICD-10-CM | POA: Insufficient documentation

## 2024-01-02 DIAGNOSIS — N3090 Cystitis, unspecified without hematuria: Secondary | ICD-10-CM | POA: Diagnosis not present

## 2024-01-02 DIAGNOSIS — N189 Chronic kidney disease, unspecified: Secondary | ICD-10-CM | POA: Diagnosis not present

## 2024-01-02 DIAGNOSIS — I2510 Atherosclerotic heart disease of native coronary artery without angina pectoris: Secondary | ICD-10-CM | POA: Diagnosis not present

## 2024-01-02 DIAGNOSIS — R319 Hematuria, unspecified: Secondary | ICD-10-CM | POA: Diagnosis present

## 2024-01-02 LAB — URINALYSIS, ROUTINE W REFLEX MICROSCOPIC
Bilirubin Urine: NEGATIVE
Glucose, UA: NEGATIVE mg/dL
Ketones, ur: NEGATIVE mg/dL
Nitrite: NEGATIVE
Protein, ur: NEGATIVE mg/dL
Specific Gravity, Urine: 1.017 (ref 1.005–1.030)
WBC, UA: >50 WBC/hpf (ref 0–5)
pH: 6 (ref 5.0–8.0)

## 2024-01-02 LAB — CBC
HCT: 32.8 % — ABNORMAL LOW (ref 39.0–52.0)
Hemoglobin: 10.5 g/dL — ABNORMAL LOW (ref 13.0–17.0)
MCH: 31.9 pg (ref 26.0–34.0)
MCHC: 32 g/dL (ref 30.0–36.0)
MCV: 99.7 fL (ref 80.0–100.0)
Platelets: 254 K/uL (ref 150–400)
RBC: 3.29 MIL/uL — ABNORMAL LOW (ref 4.22–5.81)
RDW: 15.5 % (ref 11.5–15.5)
WBC: 4.7 K/uL (ref 4.0–10.5)
nRBC: 0 % (ref 0.0–0.2)

## 2024-01-02 LAB — BASIC METABOLIC PANEL WITH GFR
Anion gap: 8 (ref 5–15)
BUN: 36 mg/dL — ABNORMAL HIGH (ref 8–23)
CO2: 23 mmol/L (ref 22–32)
Calcium: 9.5 mg/dL (ref 8.9–10.3)
Chloride: 110 mmol/L (ref 98–111)
Creatinine, Ser: 1.6 mg/dL — ABNORMAL HIGH (ref 0.61–1.24)
GFR, Estimated: 43 mL/min — ABNORMAL LOW (ref >60)
Glucose, Bld: 94 mg/dL (ref 70–99)
Potassium: 4.3 mmol/L (ref 3.5–5.1)
Sodium: 140 mmol/L (ref 135–145)

## 2024-01-02 MED ORDER — CEPHALEXIN 500 MG PO CAPS: 500.0000 mg | ORAL_CAPSULE | Freq: Three times a day (TID) | ORAL | 0 refills | Status: AC

## 2024-01-02 NOTE — ED Triage Notes (Signed)
 Patient has had bright red blood in his urine since last night. Stated he has kidney disease that might be causing this. Burned when he urinated last night. Takes aspirin  daily.

## 2024-01-02 NOTE — ED Provider Notes (Signed)
 I saw and evaluated the patient, reviewed the resident's note and I agree with the findings and plan.   83 year old male presents with hematuria.  His urinalysis consistent with cystitis.  He is on Eliquis .  Will place on antibiotics and discharge   Dasie Faden, MD 01/02/24 1319

## 2024-01-02 NOTE — ED Provider Notes (Signed)
 [place] EMERGENCY DEPARTMENT AT [facility] Provider Note   CSN: [id number] Arrival date & time: 01/02/24  1127     Patient presents with: Hematuria   Mike Roach is a 83 y.o. male.   PMH includes Kidney cancer, iron deficiency anemia, CKD, atrial flutter, GERD, CAD. He presents after noticing some blood in his urine last night and this morning.  He also reports running with urination since last night.  He denies any fevers or chills.  He denies any abdominal pain or flank pain.  He denies any discharge or bleeding from the urethral meatus.  He reports that the amount of blood was just enough for him to notice but not enough to create a significant concern.  The history is provided by the patient.  Hematuria This is a new problem. The current episode started 12 to 24 hours ago. The problem has not changed since onset.Nothing aggravates the symptoms. Nothing relieves the symptoms.       Prior to Admission medications   Medication Sig Start Date End Date Taking? Authorizing Provider  cephALEXin (KEFLEX) 500 MG capsule Take 1 capsule (500 mg total) by mouth 3 (three) times daily for 5 days. 01/02/24 01/07/24 Yes Cleotilde Lukes, DO  apixaban  (ELIQUIS ) 2.5 MG TABS tablet Take 1 tablet (2.5 mg total) by mouth 2 (two) times daily. 10/19/23   Cockerham, Alicia M, PA-C  artificial tears ophthalmic solution Place into both eyes daily as needed for dry eyes.    [provider]  ascorbic acid (VITAMIN C) 500 MG tablet Take 500 mg by mouth daily.    [provider]  Cholecalciferol (VITAMIN D3) 10 MCG (400 UNIT) tablet Take 400 Units by mouth daily.    [provider]  dorzolamide -timolol (COSOPT) 2-0.5 % ophthalmic solution Place 1 drop into both eyes 2 (two) times daily. 12/15/23 12/14/24  [provider]  finasteride  (PROSCAR ) 5 MG tablet Take 1 tablet (5 mg total) by mouth every evening. 12/16/22   Matilda Senior, MD  hydrocortisone  (ANUSOL-HC) 2.5 % rectal cream Place 1 Application rectally daily as needed for hemorrhoids or anal itching.    [provider]  latanoprost  (XALATAN ) 0.005 % ophthalmic solution Place 1 drop into both eyes at bedtime. 12/15/23 12/14/24  [provider]  lisinopril  (PRINIVIL ,ZESTRIL ) 20 MG tablet Take 20 mg by mouth every morning.     [provider]  Multiple Vitamins-Minerals (MULTIVITAMINS THER. W/MINERALS) TABS tablet Take 1 tablet by mouth daily.     [provider]  nitroGLYCERIN  (NITROSTAT ) 0.4 MG SL tablet Place 1 tablet (0.4 mg total) under the tongue every 5 (five) minutes as needed for chest pain. 02/28/19   Nahser, Aleene PARAS, MD  Omega-3 Fatty Acids (FISH OIL) 1200 MG CAPS Take 1,200 mg by mouth daily.    [provider]  omeprazole (PRILOSEC) 20 MG capsule Take 20 mg by mouth daily.     [provider]  polyethylene glycol (MIRALAX ) 17 g packet Take 17 g by mouth daily. 10/19/23   Renae Bernarda HERO, PA-C  pyridOXINE (VITAMIN B6) 100 MG tablet Take 100 mg by mouth daily.    [provider]  rosuvastatin  (CRESTOR ) 10 MG tablet Take 1 tablet (10 mg total) by mouth daily. Please schedule yearly appointment for future refills. Thank you 08/20/20   Nahser, Aleene PARAS, MD  vitamin B-12 (CYANOCOBALAMIN) 500 MCG tablet Take 500 mcg by mouth daily.    [provider]    Allergies: Patient has  no known allergies.    Review of Systems  Genitourinary:  Positive for hematuria.    Updated Vital Signs BP 123/67   Pulse (!) 45   Temp 98.3 F (36.8 C) (Oral)   Resp 18   Ht 6' 2 (1.88 m)   Wt 81.6 kg   SpO2 100%   BMI 23.11 kg/m   Physical Exam Constitutional:      Appearance: Normal appearance.  Eyes:     Extraocular Movements: Extraocular movements intact.     Pupils: Pupils are equal, round, and reactive to light.  Cardiovascular:     Rate and Rhythm: Normal rate and regular rhythm.  Pulmonary:     Effort:  Pulmonary effort is normal.     Breath sounds: Normal breath sounds.  Abdominal:     General: Abdomen is flat. Bowel sounds are normal.     Palpations: Abdomen is soft.     Tenderness: There is no right CVA tenderness or left CVA tenderness.  Musculoskeletal:     Cervical back: Normal range of motion.  Skin:    General: Skin is warm and dry.  Neurological:     Mental Status: He is alert.     (all labs ordered are listed, but only abnormal results are displayed) Labs Reviewed  URINALYSIS, ROUTINE W REFLEX MICROSCOPIC - Abnormal; Notable for the following components:      Result Value   APPearance HAZY (*)    Hgb urine dipstick SMALL (*)    Leukocytes,Ua LARGE (*)    Bacteria, UA RARE (*)    All other components within normal limits  CBC - Abnormal; Notable for the following components:   RBC 3.29 (*)    Hemoglobin 10.5 (*)    HCT 32.8 (*)    All other components within normal limits  BASIC METABOLIC PANEL WITH GFR    EKG: None  Radiology: No results found.   Procedures   Medications Ordered in the ED - No data to display                                  Medical Decision Making This is an 83 year old patient with a past medical history which includes CKD and renal cancer.  Differential for hematuria includes but is not limited to: 1. UTI/Cystitis 2.  Nephrolithiasis 3.  Cancer  Based on UA, most likely cause is simple cystitis, based on presence of bacteria. Will treat with 5 day course of keflex BID, and patient should follow up with his PCP.   Nephrolithiasis is less likely given short onset of symptoms and lack of flank pain. Cancer could be present though would not present with concurrent dysuria. Return precautions given.  Amount and/or Complexity of Data Reviewed Labs: ordered.  Risk Prescription drug management.    Final diagnoses:  Cystitis    ED Discharge Orders          Ordered    cephALEXin (KEFLEX) 500 MG capsule  3 times daily         01/02/24 1320               Cleotilde Lukes, DO 01/02/24 1436    Dasie Faden, MD 01/03/24 1017

## 2024-01-08 ENCOUNTER — Inpatient Hospital Stay: Payer: Medicare HMO | Attending: Hematology and Oncology | Admitting: Hematology and Oncology

## 2024-01-08 ENCOUNTER — Inpatient Hospital Stay: Payer: Medicare HMO
